# Patient Record
Sex: Female | Born: 1965
Health system: Southern US, Community
[De-identification: ages and names within clinical notes are randomized; demographics above are authoritative.]

## PROBLEM LIST (undated history)

## (undated) DIAGNOSIS — K219 Gastro-esophageal reflux disease without esophagitis: Secondary | ICD-10-CM

## (undated) DIAGNOSIS — J45909 Unspecified asthma, uncomplicated: Secondary | ICD-10-CM

## (undated) DIAGNOSIS — T7840XA Allergy, unspecified, initial encounter: Secondary | ICD-10-CM

## (undated) DIAGNOSIS — F32A Depression, unspecified: Secondary | ICD-10-CM

## (undated) DIAGNOSIS — M8580 Other specified disorders of bone density and structure, unspecified site: Secondary | ICD-10-CM

## (undated) DIAGNOSIS — F329 Major depressive disorder, single episode, unspecified: Secondary | ICD-10-CM

## (undated) DIAGNOSIS — G43909 Migraine, unspecified, not intractable, without status migrainosus: Secondary | ICD-10-CM

## (undated) DIAGNOSIS — E538 Deficiency of other specified B group vitamins: Secondary | ICD-10-CM

## (undated) DIAGNOSIS — G4733 Obstructive sleep apnea (adult) (pediatric): Secondary | ICD-10-CM

## (undated) DIAGNOSIS — Z8619 Personal history of other infectious and parasitic diseases: Secondary | ICD-10-CM

## (undated) HISTORY — DX: Migraine, unspecified, not intractable, without status migrainosus: G43.909

## (undated) HISTORY — DX: Personal history of other infectious and parasitic diseases: Z86.19

## (undated) HISTORY — DX: Obstructive sleep apnea (adult) (pediatric): G47.33

## (undated) HISTORY — PX: OTHER SURGICAL HISTORY: SHX169

## (undated) HISTORY — DX: Major depressive disorder, single episode, unspecified: F32.9

## (undated) HISTORY — DX: Unspecified asthma, uncomplicated: J45.909

## (undated) HISTORY — PX: MYRINGOTOMY: SUR874

## (undated) HISTORY — DX: Other specified disorders of bone density and structure, unspecified site: M85.80

## (undated) HISTORY — DX: Depression, unspecified: F32.A

## (undated) HISTORY — DX: Gastro-esophageal reflux disease without esophagitis: K21.9

## (undated) HISTORY — DX: Deficiency of other specified B group vitamins: E53.8

## (undated) HISTORY — DX: Allergy, unspecified, initial encounter: T78.40XA

## (undated) HISTORY — PX: INNER EAR SURGERY: SHX679

---
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
## 1999-10-19 HISTORY — PX: TUBAL LIGATION: SHX77

## 2005-10-18 HISTORY — PX: CHOLECYSTECTOMY: SHX55

## 2015-03-14 ENCOUNTER — Ambulatory Visit (INDEPENDENT_AMBULATORY_CARE_PROVIDER_SITE_OTHER): Payer: 59

## 2015-03-14 ENCOUNTER — Ambulatory Visit (INDEPENDENT_AMBULATORY_CARE_PROVIDER_SITE_OTHER): Payer: 59 | Admitting: Podiatry

## 2015-03-14 ENCOUNTER — Encounter: Payer: Self-pay | Admitting: Podiatry

## 2015-03-14 VITALS — BP 137/81 | HR 119 | Resp 12 | Ht 59.0 in | Wt 150.0 lb

## 2015-03-14 DIAGNOSIS — M79671 Pain in right foot: Secondary | ICD-10-CM

## 2015-03-14 DIAGNOSIS — M779 Enthesopathy, unspecified: Secondary | ICD-10-CM

## 2015-03-14 MED ORDER — TRIAMCINOLONE ACETONIDE 10 MG/ML IJ SUSP
10.0000 mg | Freq: Once | INTRAMUSCULAR | Status: AC
  Administered 2015-03-14: 10 mg

## 2015-03-14 NOTE — Progress Notes (Signed)
   Subjective:    Patient ID: Terri Johnson, female    DOB: 1965/12/30, 49 y.o.   MRN: [id number]  HPI    Review of Systems  All other systems reviewed and are negative.      Objective:   Physical Exam        Assessment & Plan:

## 2015-03-15 NOTE — Progress Notes (Signed)
Subjective:     Patient ID: Terri Johnson, female   DOB: 15-Jan-1966, 49 y.o.   MRN: [id number]  HPI patient presents stating she twisted her ankle around 2 months ago and felt like something popped but she's not sure. It does not feel unstable but she has pain within the ankle and also has chronic pain in both her feet from standing on cement floors at work   Review of Systems  All other systems reviewed and are negative.      Objective:   Physical Exam  Constitutional: She is oriented to person, place, and time.  Cardiovascular: Intact distal pulses.   Musculoskeletal: Normal range of motion.  Neurological: She is oriented to person, place, and time.  Skin: Skin is warm.  Nursing note and vitals reviewed.  neurovascular status intact muscle strength adequate with range of motion subtalar midtarsal joint within normal limits. Patient is noted to have quite a bit of pain in the sinus tarsi and distal to this point right with inflammation noted but no ligamentous laxity was noted and no pain around the fibula or the peroneal tendon group. She is noted to have good digital perfusion and is well oriented 3     Assessment:     Possible sprain with sinus tarsitis right and chronic low-grade foot pain secondary to high arch foot which may be creating pressure on the heel and forefoot    Plan:     H&P and conditions reviewed with patient. Also reviewed x-rays and today I did a careful sinus tarsi injection and took it more lateral in its nature. I applied fascial brace to give her some stability and discussed long-term orthotics to control the plantar pain. Reappoint in 2 weeks or reevaluate earlier if needed

## 2015-03-21 ENCOUNTER — Telehealth: Payer: Self-pay | Admitting: *Deleted

## 2015-03-21 ENCOUNTER — Ambulatory Visit: Payer: Self-pay | Admitting: Podiatry

## 2015-03-21 NOTE — Telephone Encounter (Signed)
Unable to reach patient at time of Pre-Visit Call.  Left message for patient to return call when available.    

## 2015-03-24 ENCOUNTER — Encounter: Payer: Self-pay | Admitting: Family

## 2015-03-24 ENCOUNTER — Ambulatory Visit (INDEPENDENT_AMBULATORY_CARE_PROVIDER_SITE_OTHER): Payer: 59 | Admitting: Family

## 2015-03-24 VITALS — BP 139/80 | HR 115 | Temp 98.0°F | Resp 18 | Ht 60.0 in | Wt 149.8 lb

## 2015-03-24 DIAGNOSIS — F329 Major depressive disorder, single episode, unspecified: Secondary | ICD-10-CM | POA: Insufficient documentation

## 2015-03-24 DIAGNOSIS — H547 Unspecified visual loss: Secondary | ICD-10-CM

## 2015-03-24 DIAGNOSIS — J45909 Unspecified asthma, uncomplicated: Secondary | ICD-10-CM | POA: Insufficient documentation

## 2015-03-24 DIAGNOSIS — J4520 Mild intermittent asthma, uncomplicated: Secondary | ICD-10-CM | POA: Diagnosis not present

## 2015-03-24 DIAGNOSIS — R51 Headache: Secondary | ICD-10-CM

## 2015-03-24 DIAGNOSIS — R519 Headache, unspecified: Secondary | ICD-10-CM | POA: Insufficient documentation

## 2015-03-24 DIAGNOSIS — K219 Gastro-esophageal reflux disease without esophagitis: Secondary | ICD-10-CM | POA: Diagnosis not present

## 2015-03-24 DIAGNOSIS — F418 Other specified anxiety disorders: Secondary | ICD-10-CM

## 2015-03-24 DIAGNOSIS — F32A Depression, unspecified: Secondary | ICD-10-CM | POA: Insufficient documentation

## 2015-03-24 DIAGNOSIS — F419 Anxiety disorder, unspecified: Secondary | ICD-10-CM

## 2015-03-24 MED ORDER — ESOMEPRAZOLE MAGNESIUM 40 MG PO CPDR: 40.0000 mg | DELAYED_RELEASE_CAPSULE | Freq: Every day | ORAL | Status: DC

## 2015-03-24 MED ORDER — SUMATRIPTAN SUCCINATE 50 MG PO TABS: 50.0000 mg | ORAL_TABLET | ORAL | Status: DC | PRN

## 2015-03-24 MED ORDER — MONTELUKAST SODIUM 10 MG PO TABS: 10.0000 mg | ORAL_TABLET | Freq: Every day | ORAL | Status: DC

## 2015-03-24 MED ORDER — BUPROPION HCL ER (XL) 300 MG PO TB24: 300.0000 mg | ORAL_TABLET | Freq: Every day | ORAL | Status: DC

## 2015-03-24 NOTE — Assessment & Plan Note (Signed)
Stable on nexium continue same.

## 2015-03-24 NOTE — Progress Notes (Signed)
Pre visit review using our clinic review tool, if applicable. No additional management support is needed unless otherwise documented below in the visit note. 

## 2015-03-24 NOTE — Progress Notes (Signed)
Subjective:    Patient ID: Terri Johnson, female    DOB: 1966-07-26, 49 y.o.   MRN: [id number]  HPI  Terri Johnson is a 49 yr old female who presents today to establish care.  Her chief complaint today is "sharp pains on the left side of her head which last 5-10 minutes and are associated with with visual changes. Reports that the vision in the left eye became "black." Vision loss was 30 minutes. This occurred while she was at work 2 months ago. Most recent HA was last Sunday.   Pain comes on suddenly and is "like a knife."  Reports pain is so bad that she has to sit down.  She reports that she has had 4-5 episodes in the last 2-3 months. Denies associated nausea/photophobia/phonophobia.  Reports that these episodes are different from her previous migraines.  She used to see Dr[facility]unisi at [facility].   Asthma- worse in the winter.  Recently only using albuterol once a week.    GERD- stable on nexium 40mg . Has not tolerated dropping to 20mg .   Depression/anxiety- reports hx of anxiety/ocd symptoms. Reports that she has excessive worry,lets things bother her too much.  Gets overwhelmed.  Has been on Wellbutrin since 2004.  Overall reports depression/anxiety are well controlled.  Reports she has tried lexapro in the past which did not help at all.   Reports that she has not had a period in 1 year.  Reports stress incontinence.    Review of Systems    see HPI  Past Medical History  Diagnosis Date  . Asthma   . History of chicken pox   . Depression   . Allergy   . GERD (gastroesophageal reflux disease)   . Migraines     History   Social History  . Marital Status: Married    Spouse Name: N/A  . Number of Children: N/A  . Years of Education: N/A   Occupational History  . Not on file.   Social History Main Topics  . Smoking status: Never Smoker   . Smokeless tobacco: Not on file  . Alcohol Use: Not on file  . Drug Use: Not on file  . Sexual Activity: Not on file   Other Topics  Concern  . Not on file   Social History Narrative    Past Surgical History  Procedure Laterality Date  . Myringotomy Bilateral     twice each.  Eual Fines repair - left Left   . Cholecystectomy  2007  . Tonsillectomy and adenoidectomy  1976  . Inner ear surgery      Pt states she had her "eardrum rebuilt". Has had 8 sets of tubes in ears through adolecense  . Tubal ligation  2001  . Cesarean section  1991    Family History  Problem Relation Age of Onset  . Hyperlipidemia Mother   . Heart disease Mother   . Hypertension Mother   . Depression Mother   . Parkinson's disease Father   . Depression Maternal Aunt   . Alcohol abuse Maternal Uncle   . Depression Maternal Grandmother   . Heart disease Maternal Grandfather   . Hypertension Maternal Grandfather   . Alcohol abuse Maternal Grandfather   . AVM Maternal Aunt     congenital  . Aneurysm Maternal Aunt     No Known Allergies  Current Outpatient Prescriptions on File Prior to Visit  Medication Sig Dispense Refill  . albuterol (PROVENTIL HFA;VENTOLIN HFA) 108 (90 BASE) MCG/ACT inhaler Inhale  into the lungs every 6 (six) hours as needed for wheezing or shortness of breath.    Marland Kitchen buPROPion (WELLBUTRIN XL) 300 MG 24 hr tablet Take 300 mg by mouth daily.    . cetirizine (ZYRTEC) 10 MG tablet Take 10 mg by mouth daily.    . montelukast (SINGULAIR) 10 MG tablet Take 10 mg by mouth at bedtime.    . pseudoephedrine (SUDAFED) 120 MG 12 hr tablet Take 120 mg by mouth daily.    Marland Kitchen topiramate (TOPAMAX) 50 MG tablet Take 50 mg by mouth 2 (two) times daily.     No current facility-administered medications on file prior to visit.    BP 139/80 mmHg  Pulse 115  Temp(Src) 98 F (36.7 C) (Oral)  Resp 18  Ht 5' (1.524 m)  Wt 149 lb 12.8 oz (67.949 kg)  BMI 29.26 kg/m2  SpO2 99%  LMP 03/23/2014    Objective:   Physical Exam  Constitutional: She is oriented to person, place, and time. She appears well-developed and well-nourished.   HENT:  Head: Normocephalic and atraumatic.  Right Ear: Tympanic membrane and ear canal normal.  Left Ear: Tympanic membrane and ear canal normal.  Eyes: No scleral icterus.  Cardiovascular: Normal rate, regular rhythm and normal heart sounds.   No murmur heard. Pulmonary/Chest: Effort normal and breath sounds normal. No respiratory distress. She has no wheezes.  Lymphadenopathy:    She has no cervical adenopathy.  Neurological: She is alert and oriented to person, place, and time.  Skin: Skin is warm and dry.  Psychiatric: Her behavior is normal. Judgment and thought content normal.  Quiet, flat affect.  Briefly tearful          Assessment & Plan:

## 2015-03-24 NOTE — Assessment & Plan Note (Signed)
Pt reports feeling satisfied with wellbutrin.  Continue same, monitor.

## 2015-03-24 NOTE — Assessment & Plan Note (Signed)
Symptoms could be due to migraine.  Discussed case with Dr. Charlett Blake. Will obtain ESR and CT head. Add imitrex prn and refer to neurology for ongoing management.

## 2015-03-24 NOTE — Assessment & Plan Note (Signed)
Stable on prn albuterol, continue same.

## 2015-03-24 NOTE — Patient Instructions (Signed)
Please complete lab work prior to leaving. You will be contacted about your CT head and your referral to GI. Please let me know if you have not heard back about these in 1 month.  Schedule a complete physical at the front desk. Welcome to [facility]!

## 2015-03-25 ENCOUNTER — Ambulatory Visit (HOSPITAL_BASED_OUTPATIENT_CLINIC_OR_DEPARTMENT_OTHER)
Admission: RE | Admit: 2015-03-25 | Discharge: 2015-03-25 | Disposition: A | Discharge status: Home / Self Care | Payer: 59 | Source: Ambulatory Visit | Attending: Family | Admitting: Family

## 2015-03-25 DIAGNOSIS — R519 Headache, unspecified: Secondary | ICD-10-CM

## 2015-03-25 DIAGNOSIS — H5442 Blindness, left eye, normal vision right eye: Secondary | ICD-10-CM | POA: Insufficient documentation

## 2015-03-25 DIAGNOSIS — R51 Headache: Secondary | ICD-10-CM | POA: Diagnosis present

## 2015-03-25 LAB — SEDIMENTATION RATE: Sed Rate: 34 mm/hr — ABNORMAL HIGH (ref 0–22)

## 2015-03-27 ENCOUNTER — Telehealth: Payer: Self-pay | Admitting: Family

## 2015-03-27 NOTE — Telephone Encounter (Addendum)
Opened in error

## 2015-03-28 ENCOUNTER — Telehealth: Payer: Self-pay | Admitting: Family

## 2015-03-28 ENCOUNTER — Ambulatory Visit (INDEPENDENT_AMBULATORY_CARE_PROVIDER_SITE_OTHER): Payer: 59 | Admitting: Podiatry

## 2015-03-28 DIAGNOSIS — M79671 Pain in right foot: Secondary | ICD-10-CM

## 2015-03-28 DIAGNOSIS — M779 Enthesopathy, unspecified: Secondary | ICD-10-CM

## 2015-03-28 NOTE — Telephone Encounter (Signed)
Notified pt of below results. She reports that headache comes and goes. Hasn't had another "really, really bad one" since she was last seen. Pt already has f/u with PCP on 05/05/15 and will repeat sed rate at that visit.

## 2015-03-28 NOTE — Telephone Encounter (Addendum)
Please contact pt to see how she is feeling re: headache. Sed rate is mildly elevated. This is a non-specific finding.  Recommend aspirin 81mg once daily. Recommend repeat ESR in 3-4 weeks. Call us if worsening HA.  CT head negative.  

## 2015-03-31 NOTE — Progress Notes (Signed)
Subjective:     Patient ID: Terri Johnson, female   DOB: 07-29-1966, 49 y.o.   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 Reappoint when these types of orthotics are returned

## 2015-04-07 ENCOUNTER — Telehealth: Payer: Self-pay | Admitting: Family

## 2015-04-07 NOTE — Telephone Encounter (Signed)
-----  Message from Mosie Lukes, MD sent at 04/02/2015 10:50 PM EDT ----- Sed rate only mildly hi. Would treat with steroids if pain persists but unlikely to benefit from biopsy unless symptoms worsen ----- Message -----    From: Debbrah Alar, NP    Sent: 03/27/2015  10:35 AM      To: Mosie Lukes, MD  Hi,  This is the patient we discussed re: headache.  CT head negative. ESR elevated. Would you pursue temp artery biopsy in this patient or empiric rx with prednisone? If so, do you refer to gen surgery for this?  Thanks!  Mleissa

## 2015-04-16 ENCOUNTER — Telehealth: Payer: Self-pay | Admitting: Family

## 2015-04-16 NOTE — Telephone Encounter (Signed)
pre visit letter mailed 04/14/15

## 2015-04-18 ENCOUNTER — Ambulatory Visit: Payer: 59 | Admitting: Podiatry

## 2015-04-18 DIAGNOSIS — M779 Enthesopathy, unspecified: Secondary | ICD-10-CM

## 2015-04-18 NOTE — Patient Instructions (Signed)

## 2015-04-18 NOTE — Progress Notes (Signed)
pT IS HERE TO [place]

## 2015-05-05 ENCOUNTER — Encounter: Payer: Self-pay | Admitting: Family

## 2015-05-05 ENCOUNTER — Ambulatory Visit (INDEPENDENT_AMBULATORY_CARE_PROVIDER_SITE_OTHER): Payer: 59 | Admitting: Family

## 2015-05-05 ENCOUNTER — Ambulatory Visit: Payer: 59 | Admitting: Neurology

## 2015-05-05 VITALS — BP 120/84 | HR 100 | Temp 97.8°F | Resp 18 | Ht 60.0 in | Wt 152.6 lb

## 2015-05-05 DIAGNOSIS — E559 Vitamin D deficiency, unspecified: Secondary | ICD-10-CM | POA: Diagnosis not present

## 2015-05-05 DIAGNOSIS — N951 Menopausal and female climacteric states: Secondary | ICD-10-CM | POA: Diagnosis not present

## 2015-05-05 DIAGNOSIS — Z0000 Encounter for general adult medical examination without abnormal findings: Secondary | ICD-10-CM

## 2015-05-05 DIAGNOSIS — Z780 Asymptomatic menopausal state: Secondary | ICD-10-CM | POA: Diagnosis not present

## 2015-05-05 DIAGNOSIS — R232 Flushing: Secondary | ICD-10-CM

## 2015-05-05 NOTE — Patient Instructions (Addendum)
Please complete lab work prior to leaving.  You will be contacted about your referral to GYN. Follow up in 6 months.

## 2015-05-05 NOTE — Progress Notes (Signed)
Pre visit review using our clinic review tool, if applicable. No additional management support is needed unless otherwise documented below in the visit note. 

## 2015-05-05 NOTE — Progress Notes (Signed)
Subjective:    Patient ID: Terri Johnson, female    DOB: [birth year], 49 y.o.   MRN: [id number]  HPI  Terri Johnson is a 49 yr old female who presents today for cpx.  Immunizations:last tetanus 2/16 when she had head laceration at [facility].  Diet: not eating healthy.  Reports + GERD symptoms.  (reports that she had endo 3 yrs ago- reports normal. On nexium).  Never wanted to proceed with the gastroparesis study.   Breakfast- granola bar Lunch- granola bar Dinner- chicken, salad, veggie avoids red meat.   Exercise: reports that she walks 3 x a week x 45 minutes. Colonoscopy: to begin at 50 Pap Smear: 2 years ago. Mammogram: due Dental: up to date Vision: up to date  LMP was 1.5 yrs ago.  + hot flashes, not severe.  Reports low sex drive.  Sleeps with the fan on her.    Review of Systems  Constitutional: Negative for unexpected weight change.  HENT: Negative for hearing loss and rhinorrhea.   Eyes: Negative for visual disturbance.  Respiratory: Negative for cough.   Cardiovascular: Negative for leg swelling.  Gastrointestinal: Negative for nausea, diarrhea and constipation.  Genitourinary: Negative for dysuria and frequency.  Musculoskeletal: Negative for myalgias.       Occasional hip pain  Skin: Negative for rash.  Neurological:       Seeing neuro for headaches  Hematological: Negative for adenopathy.  Psychiatric/Behavioral: Negative for dysphoric mood and agitation.       Past Medical History  Diagnosis Date  . Asthma   . History of chicken pox   . Depression   . Allergy   . GERD (gastroesophageal reflux disease)   . Migraines     History   Social History  . Marital Status: Married    Spouse Name: N/A  . Number of Children: N/A  . Years of Education: N/A   Occupational History  . Not on file.   Social History Main Topics  . Smoking status: Never Smoker   . Smokeless tobacco: Not on file  . Alcohol Use: Not on file  . Drug Use: Not on file  .  Sexual Activity: Not on file   Other Topics Concern  . 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    Has associates degree   Enjoys painting, animals, antiquing, shopping, reading, movies       Past Surgical History  Procedure Laterality Date  . Myringotomy Bilateral     twice each.  Eual Fines repair - left Left   . Cholecystectomy  2007  . Tonsillectomy and adenoidectomy  1976  . Inner ear surgery      Pt states she had her "eardrum rebuilt". Has had 8 sets of tubes in ears through adolecense  . Tubal ligation  2001  . Cesarean section  1991    Family History  Problem Relation Age of Onset  . Hyperlipidemia Mother   . Heart disease Mother   . Hypertension Mother   . Depression Mother   . Parkinson's disease Father   . Depression Maternal Aunt   . Alcohol abuse Maternal Uncle   . Depression Maternal Grandmother   . Heart disease Maternal Grandfather   . Hypertension Maternal Grandfather   . Alcohol abuse Maternal Grandfather   . AVM Maternal Aunt     congenital  . Aneurysm Maternal Aunt  No Known Allergies  Current Outpatient Prescriptions on File Prior to Visit  Medication Sig Dispense Refill  . albuterol (PROVENTIL HFA;VENTOLIN HFA) 108 (90 BASE) MCG/ACT inhaler Inhale into the lungs every 6 (six) hours as needed for wheezing or shortness of breath.    Marland Kitchen aspirin 81 MG tablet Take 81 mg by mouth daily.    . betamethasone valerate ointment (VALISONE) 0.1 % Apply small amount to ear as needed for itching/scaling.    Marland Kitchen buPROPion (WELLBUTRIN XL) 300 MG 24 hr tablet Take 1 tablet (300 mg total) by mouth daily. 90 tablet 1  . cetirizine (ZYRTEC) 10 MG tablet Take 10 mg by mouth daily.    . ergocalciferol (VITAMIN D2) 50000 UNITS capsule Take 50,000 Units by mouth once a week.    . esomeprazole (NEXIUM) 40 MG capsule Take 1 capsule (40 mg total) by mouth daily at 12 noon. 90 capsule 1  .  montelukast (SINGULAIR) 10 MG tablet Take 1 tablet (10 mg total) by mouth at bedtime. 90 tablet 1  . pseudoephedrine (SUDAFED) 120 MG 12 hr tablet Take 120 mg by mouth daily.    . SUMAtriptan (IMITREX) 50 MG tablet Take 1 tablet (50 mg total) by mouth every 2 (two) hours as needed for migraine. May repeat in 2 hours if headache persists or recurs. 10 tablet 0  . topiramate (TOPAMAX) 50 MG tablet Take 50 mg by mouth 2 (two) times daily.     No current facility-administered medications on file prior to visit.    BP 120/84 mmHg  Pulse 100  Temp(Src) 97.8 F (36.6 C) (Oral)  Resp 18  Ht 5' (1.524 m)  Wt 152 lb 9.6 oz (69.219 kg)  BMI 29.80 kg/m2  SpO2 100%  LMP 03/23/2014    Objective:   Physical Exam   Physical Exam  Constitutional: She is oriented to person, place, and time. She appears well-developed and well-nourished. No distress.  HENT:  Head: Normocephalic and atraumatic.  Right Ear: Tympanic membrane and ear canal normal.  Left Ear: Tympanic membrane and ear canal normal.  Mouth/Throat: Oropharynx is clear and moist.  Eyes: Pupils are equal, round, and reactive to light. No scleral icterus.  Neck: Normal range of motion. No thyromegaly present.  Cardiovascular: Normal rate and regular rhythm.   No murmur heard. Pulmonary/Chest: Effort normal and breath sounds normal. No respiratory distress. He has no wheezes. She has no rales. She exhibits no tenderness.  Abdominal: Soft. Bowel sounds are normal. He exhibits no distension and no mass. There is no tenderness. There is no rebound and no guarding.  Musculoskeletal: She exhibits no edema.  Lymphadenopathy:    She has no cervical adenopathy.  Neurological: She is alert and oriented to person, place, and time. She has normal patellar reflexes. She exhibits normal muscle tone. Coordination normal.  Skin: Skin is warm and dry.  Psychiatric: She has a normal mood and affect. Her behavior is normal. Judgment and thought content  normal.  Breasts: Examined lying Right: Without masses, retractions, discharge or axillary adenopathy.  Left: Without masses, retractions, discharge or axillary adenopathy.  Pelvis: deferred    Assessment & Plan:           Assessment & Plan:  EKG tracing is personally reviewed.  EKG notes NSR.  No acute changes.

## 2015-05-06 LAB — LIPID PANEL
Cholesterol: 199 mg/dL (ref 0–200)
HDL: 53.1 mg/dL (ref >39.00)
LDL Cholesterol: 132 mg/dL — ABNORMAL HIGH (ref 0–99)
NonHDL: 145.9
Total CHOL/HDL Ratio: 4
Triglycerides: 71 mg/dL (ref 0.0–149.0)
VLDL: 14.2 mg/dL (ref 0.0–40.0)

## 2015-05-06 LAB — CBC WITH DIFFERENTIAL/PLATELET
BASOS ABS: 0.2 10*3/uL — AB (ref 0.0–0.1)
Basophils Relative: 2.4 % (ref 0.0–3.0)
EOS ABS: 0.1 10*3/uL (ref 0.0–0.7)
EOS PCT: 2.2 % (ref 0.0–5.0)
HCT: 31.9 % — ABNORMAL LOW (ref 36.0–46.0)
Hemoglobin: 10.7 g/dL — ABNORMAL LOW (ref 12.0–15.0)
Lymphocytes Relative: 41.2 % (ref 12.0–46.0)
Lymphs Abs: 2.8 10*3/uL (ref 0.7–4.0)
MCHC: 33.6 g/dL (ref 30.0–36.0)
MCV: 88 fl (ref 78.0–100.0)
MONO ABS: 0.4 10*3/uL (ref 0.1–1.0)
MONOS PCT: 5.5 % (ref 3.0–12.0)
Neutro Abs: 3.4 10*3/uL (ref 1.4–7.7)
Neutrophils Relative %: 48.7 % (ref 43.0–77.0)
PLATELETS: 291 10*3/uL (ref 150.0–400.0)
RBC: 3.63 Mil/uL — ABNORMAL LOW (ref 3.87–5.11)
RDW: 15.5 % (ref 11.5–15.5)
WBC: 6.9 10*3/uL (ref 4.0–10.5)

## 2015-05-06 LAB — URINALYSIS, ROUTINE W REFLEX MICROSCOPIC
BILIRUBIN URINE: NEGATIVE
Hgb urine dipstick: NEGATIVE
Ketones, ur: NEGATIVE
Leukocytes, UA: NEGATIVE
Nitrite: NEGATIVE
RBC / HPF: NONE SEEN (ref 0–?)
SPECIFIC GRAVITY, URINE: 1.02 (ref 1.000–1.030)
Total Protein, Urine: NEGATIVE
URINE GLUCOSE: NEGATIVE
UROBILINOGEN UA: 0.2 (ref 0.0–1.0)
pH: 6 (ref 5.0–8.0)

## 2015-05-06 LAB — BASIC METABOLIC PANEL
BUN: 11 mg/dL (ref 6–23)
CO2: 22 meq/L (ref 19–32)
Calcium: 9.1 mg/dL (ref 8.4–10.5)
Chloride: 108 mEq/L (ref 96–112)
Creatinine, Ser: 0.93 mg/dL (ref 0.40–1.20)
GFR: 68.16 mL/min (ref >60.00)
Glucose, Bld: 72 mg/dL (ref 70–99)
POTASSIUM: 3.7 meq/L (ref 3.5–5.1)
Sodium: 138 mEq/L (ref 135–145)

## 2015-05-06 LAB — HEPATIC FUNCTION PANEL
ALK PHOS: 94 U/L (ref 39–117)
ALT: 17 U/L (ref 0–35)
AST: 19 U/L (ref 0–37)
Albumin: 4.1 g/dL (ref 3.5–5.2)
BILIRUBIN DIRECT: 0.1 mg/dL (ref 0.0–0.3)
Total Bilirubin: 0.3 mg/dL (ref 0.2–1.2)
Total Protein: 6.9 g/dL (ref 6.0–8.3)

## 2015-05-06 LAB — VITAMIN D 25 HYDROXY (VIT D DEFICIENCY, FRACTURES): VITD: 13.99 ng/mL — ABNORMAL LOW (ref 30.00–100.00)

## 2015-05-06 LAB — TSH: TSH: 1.93 u[IU]/mL (ref 0.35–4.50)

## 2015-05-11 DIAGNOSIS — Z0000 Encounter for general adult medical examination without abnormal findings: Secondary | ICD-10-CM | POA: Insufficient documentation

## 2015-05-11 DIAGNOSIS — R232 Flushing: Secondary | ICD-10-CM | POA: Insufficient documentation

## 2015-05-11 NOTE — Assessment & Plan Note (Signed)
Immunizations reviewed and up to date.  Discussed healthy diet, continue regular exercise.  Obtain routine labs.

## 2015-05-11 NOTE — Addendum Note (Signed)
Addended by: Debbrah Alar on: 05/11/2015 10:51 AM   Modules accepted: Miquel Dunn

## 2015-05-11 NOTE — Assessment & Plan Note (Signed)
Refer to gyn for possible HRT due.

## 2015-05-12 ENCOUNTER — Telehealth: Payer: Self-pay | Admitting: *Deleted

## 2015-05-12 DIAGNOSIS — E559 Vitamin D deficiency, unspecified: Secondary | ICD-10-CM

## 2015-05-12 DIAGNOSIS — D649 Anemia, unspecified: Secondary | ICD-10-CM

## 2015-05-12 MED ORDER — ERGOCALCIFEROL 1.25 MG (50000 UT) PO CAPS: 50000.0000 [IU] | ORAL_CAPSULE | ORAL | Status: DC

## 2015-05-12 NOTE — Telephone Encounter (Signed)
Notified pt and she voices understanding.  States she was on the weekly dose but had been out.  Rx sent, future lab order entered.

## 2015-05-12 NOTE — Telephone Encounter (Signed)
-----   Message from Debbrah Alar, NP sent at 05/11/2015  8:28 AM EDT ----- Could you please ask lab to to add on serum iron, b12, folate dx anemia? I would like pt to complete ifob as well due to anemia. Kidney function, liver function, choleserol all ok.  Vit D is low.  Is she taking the weekly supplement?  Needs to take weekly x 12 weeks then repeat vit d level please- dx vit d deficiency.

## 2015-05-13 ENCOUNTER — Other Ambulatory Visit (INDEPENDENT_AMBULATORY_CARE_PROVIDER_SITE_OTHER): Payer: 59

## 2015-05-13 ENCOUNTER — Ambulatory Visit (HOSPITAL_BASED_OUTPATIENT_CLINIC_OR_DEPARTMENT_OTHER)
Admission: RE | Admit: 2015-05-13 | Discharge: 2015-05-13 | Disposition: A | Discharge status: Home / Self Care | Payer: 59 | Source: Ambulatory Visit | Attending: Family | Admitting: Family

## 2015-05-13 DIAGNOSIS — E559 Vitamin D deficiency, unspecified: Secondary | ICD-10-CM

## 2015-05-13 DIAGNOSIS — Z1231 Encounter for screening mammogram for malignant neoplasm of breast: Secondary | ICD-10-CM | POA: Diagnosis present

## 2015-05-13 DIAGNOSIS — D649 Anemia, unspecified: Secondary | ICD-10-CM | POA: Diagnosis not present

## 2015-05-13 DIAGNOSIS — Z0000 Encounter for general adult medical examination without abnormal findings: Secondary | ICD-10-CM

## 2015-05-14 ENCOUNTER — Telehealth: Payer: Self-pay | Admitting: Family

## 2015-05-14 ENCOUNTER — Encounter: Payer: Self-pay | Admitting: Family

## 2015-05-14 DIAGNOSIS — E538 Deficiency of other specified B group vitamins: Secondary | ICD-10-CM

## 2015-05-14 HISTORY — DX: Deficiency of other specified B group vitamins: E53.8

## 2015-05-14 LAB — IRON: Iron: 49 ug/dL (ref 42–145)

## 2015-05-14 LAB — VITAMIN B12: Vitamin B-12: 115 pg/mL — ABNORMAL LOW (ref 211–911)

## 2015-05-14 LAB — FOLATE: Folate: 9.8 ng/mL (ref >5.9)

## 2015-05-14 LAB — VITAMIN D 25 HYDROXY (VIT D DEFICIENCY, FRACTURES): VITD: 12.22 ng/mL — ABNORMAL LOW (ref 30.00–100.00)

## 2015-05-14 NOTE — Telephone Encounter (Signed)
b12 is low.  Advise pt to start b12 injections 1028mcg once weekly x 4 weeks, then once monthly, repeat b12 in 3 months. Dx

## 2015-05-14 NOTE — Telephone Encounter (Addendum)
b12 low. Advise start b12 1098mcg IM once weekly x 4 weeks, then once monthly. Does she drink alcohol?  If so she should discontinue use.

## 2015-05-22 ENCOUNTER — Other Ambulatory Visit (INDEPENDENT_AMBULATORY_CARE_PROVIDER_SITE_OTHER): Payer: 59

## 2015-05-22 DIAGNOSIS — D649 Anemia, unspecified: Secondary | ICD-10-CM

## 2015-05-22 LAB — FECAL OCCULT BLOOD, IMMUNOCHEMICAL: Fecal Occult Bld: NEGATIVE

## 2015-05-23 ENCOUNTER — Encounter: Payer: Self-pay | Admitting: Family

## 2015-05-29 NOTE — Telephone Encounter (Signed)
Called left message to call back 

## 2015-05-29 NOTE — Telephone Encounter (Signed)
Called the patient informed of results and PCP instructions.  The patient did agree to start B12 shots.  Transferred to a scheduler Pleas Koch) to schedule first B12 injection (nurse Visit).  Also patient had still not gotten an appointment for an OBGYN (referred on 05/05/15 by Debbrah Alar and notes were then sent to [facility]), Marj stated she would take care of for this patient.

## 2015-05-29 NOTE — Telephone Encounter (Signed)
Caller name: Kaelah Hayashi  Relationship to patient: Self  Can be reached: [phone] Pharmacy:  Reason for call: pt is returning your call.

## 2015-05-30 ENCOUNTER — Ambulatory Visit (INDEPENDENT_AMBULATORY_CARE_PROVIDER_SITE_OTHER): Payer: 59

## 2015-05-30 DIAGNOSIS — E538 Deficiency of other specified B group vitamins: Secondary | ICD-10-CM | POA: Diagnosis not present

## 2015-05-30 MED ORDER — CYANOCOBALAMIN 1000 MCG/ML IJ SOLN
1000.0000 ug | Freq: Once | INTRAMUSCULAR | Status: AC
  Administered 2015-05-30: 1000 ug via INTRAMUSCULAR

## 2015-05-30 NOTE — Progress Notes (Signed)
Pre visit review using our clinic review tool, if applicable. No additional management support is needed unless otherwise documented below in the visit note. 

## 2015-06-06 ENCOUNTER — Ambulatory Visit (INDEPENDENT_AMBULATORY_CARE_PROVIDER_SITE_OTHER): Payer: 59 | Admitting: *Deleted

## 2015-06-06 DIAGNOSIS — E538 Deficiency of other specified B group vitamins: Secondary | ICD-10-CM | POA: Diagnosis not present

## 2015-06-06 MED ORDER — CYANOCOBALAMIN 1000 MCG/ML IJ SOLN
1000.0000 ug | Freq: Once | INTRAMUSCULAR | Status: AC
  Administered 2015-06-06: 1000 ug via INTRAMUSCULAR

## 2015-06-06 NOTE — Progress Notes (Signed)
Pre visit review using our clinic review tool, if applicable. No additional management support is needed unless otherwise documented below in the visit note.  Patient tolerated injection well.  Next injection previously scheduled.

## 2015-06-13 ENCOUNTER — Ambulatory Visit (INDEPENDENT_AMBULATORY_CARE_PROVIDER_SITE_OTHER): Payer: 59 | Admitting: *Deleted

## 2015-06-13 DIAGNOSIS — E538 Deficiency of other specified B group vitamins: Secondary | ICD-10-CM | POA: Diagnosis not present

## 2015-06-13 MED ORDER — CYANOCOBALAMIN 1000 MCG/ML IJ SOLN
1000.0000 ug | Freq: Once | INTRAMUSCULAR | Status: AC
  Administered 2015-06-13: 1000 ug via INTRAMUSCULAR

## 2015-06-13 NOTE — Progress Notes (Signed)
Pre visit review using our clinic review tool, if applicable. No additional management support is needed unless otherwise documented below in the visit note.  Patient tolerated injection well.  Next injection previously scheduled.

## 2015-06-20 ENCOUNTER — Ambulatory Visit (INDEPENDENT_AMBULATORY_CARE_PROVIDER_SITE_OTHER): Payer: 59 | Admitting: *Deleted

## 2015-06-20 DIAGNOSIS — E538 Deficiency of other specified B group vitamins: Secondary | ICD-10-CM

## 2015-06-20 MED ORDER — CYANOCOBALAMIN 1000 MCG/ML IJ SOLN
1000.0000 ug | Freq: Once | INTRAMUSCULAR | Status: AC
  Administered 2015-06-20: 1000 ug via INTRAMUSCULAR

## 2015-06-20 NOTE — Progress Notes (Signed)
Pre visit review using our clinic review tool, if applicable. No additional management support is needed unless otherwise documented below in the visit note.  Patient tolerated injection well.   Next injection scheduled 07/18/15.  

## 2015-06-24 ENCOUNTER — Encounter: Payer: Self-pay | Admitting: Internal Medicine

## 2015-06-24 LAB — HM PAP SMEAR: HM PAP: NEGATIVE

## 2015-06-26 ENCOUNTER — Ambulatory Visit: Payer: 59 | Admitting: Neurology

## 2015-07-02 ENCOUNTER — Telehealth: Payer: Self-pay | Admitting: Family

## 2015-07-02 NOTE — Telephone Encounter (Signed)
Spoke with pt. States she was feeling great once she started B12 injections. Last injection 06/20/15 and will now be getting injection monthly. Pt states she has been very fatigued since Sunday and has been crying for the last 2 days. Denies depression or suicidal thoughts. Denies recent traumatic events and states she "has no reason to cry".  Spoke with PCP and advised pt that she doesn't feel crying is from the B12 injections and she would like to evaluate pt in the office. Pt scheduled appt for Friday at [facility] pt if she feels her symptoms are worsening to call the office for earlier appointment and she voices understanding.

## 2015-07-02 NOTE — Telephone Encounter (Signed)
Relation to IO:MBTD Call back number:[phone]   Reason for call:  Patient would like to discuss side effects of B12, patient started crying and would like to speak with Gilmore Laroche directly.

## 2015-07-04 ENCOUNTER — Encounter: Payer: Self-pay | Admitting: Family

## 2015-07-04 ENCOUNTER — Ambulatory Visit (INDEPENDENT_AMBULATORY_CARE_PROVIDER_SITE_OTHER): Payer: 59 | Admitting: Family

## 2015-07-04 VITALS — BP 120/72 | HR 106 | Temp 98.1°F | Resp 16 | Ht 60.0 in | Wt 156.4 lb

## 2015-07-04 DIAGNOSIS — F418 Other specified anxiety disorders: Secondary | ICD-10-CM | POA: Diagnosis not present

## 2015-07-04 DIAGNOSIS — D519 Vitamin B12 deficiency anemia, unspecified: Secondary | ICD-10-CM | POA: Insufficient documentation

## 2015-07-04 DIAGNOSIS — F419 Anxiety disorder, unspecified: Secondary | ICD-10-CM

## 2015-07-04 DIAGNOSIS — F32A Depression, unspecified: Secondary | ICD-10-CM

## 2015-07-04 DIAGNOSIS — F329 Major depressive disorder, single episode, unspecified: Secondary | ICD-10-CM

## 2015-07-04 LAB — CBC WITH DIFFERENTIAL/PLATELET
BASOS PCT: 1 % (ref 0–1)
Basophils Absolute: 0.1 10*3/uL (ref 0.0–0.1)
Eosinophils Absolute: 0.2 10*3/uL (ref 0.0–0.7)
Eosinophils Relative: 3 % (ref 0–5)
HCT: 30.4 % — ABNORMAL LOW (ref 36.0–46.0)
HEMOGLOBIN: 10.2 g/dL — AB (ref 12.0–15.0)
Lymphocytes Relative: 40 % (ref 12–46)
Lymphs Abs: 2.7 10*3/uL (ref 0.7–4.0)
MCH: 29.2 pg (ref 26.0–34.0)
MCHC: 33.6 g/dL (ref 30.0–36.0)
MCV: 87.1 fL (ref 78.0–100.0)
MPV: 10.5 fL (ref 8.6–12.4)
Monocytes Absolute: 0.5 10*3/uL (ref 0.1–1.0)
Monocytes Relative: 8 % (ref 3–12)
NEUTROS ABS: 3.3 10*3/uL (ref 1.7–7.7)
NEUTROS PCT: 48 % (ref 43–77)
PLATELETS: 361 10*3/uL (ref 150–400)
RBC: 3.49 MIL/uL — ABNORMAL LOW (ref 3.87–5.11)
RDW: 15.1 % (ref 11.5–15.5)
WBC: 6.8 10*3/uL (ref 4.0–10.5)

## 2015-07-04 MED ORDER — VENLAFAXINE HCL ER 37.5 MG PO CP24: ORAL_CAPSULE | ORAL | Status: DC

## 2015-07-04 NOTE — Assessment & Plan Note (Signed)
Obtain follow up CBC due to complaint of fatigue.  I suspect fatigue is due to depression however.  TSH was normal last visit.

## 2015-07-04 NOTE — Assessment & Plan Note (Signed)
Deteriorated.  Add trial of effexor, continue wellbutrin. Advised pt to go to the ED if she develops thoughts of hurting herself or others.

## 2015-07-04 NOTE — Patient Instructions (Signed)
Please complete lab work prior to leaving. Start effexor xr one tab once daily, increase to 2 tabs once daily on week two. Follow up in 1 month, call sooner if problems/concerns.

## 2015-07-04 NOTE — Progress Notes (Signed)
Pre visit review using our clinic review tool, if applicable. No additional management support is needed unless otherwise documented below in the visit note. 

## 2015-07-04 NOTE — Addendum Note (Signed)
Addended by: Kelle Darting A on: 07/04/2015 02:48 PM   Modules accepted: Orders, Medications

## 2015-07-04 NOTE — Progress Notes (Signed)
Subjective:    Patient ID: Terri Johnson, female    DOB: May 04, 1966, 49 y.o.   MRN: [id number]  HPI   Terri Johnson is a 49 yr old female who presents today to discuss fatigue. Reports for the last two weeks she has found herself constantly tearful. Finds herself sitting on the couch, napping, not wanting to do anything. No new stressors. Denies SI/HI.  She reports that she could "sleep 24/7".She is on wellbutrin. Reports that she has tried prozac and lexapro in the past which "messed with my sex drive and I didn't see any difference."    Review of Systems See HPI  Past Medical History  Diagnosis Date  . Asthma   . History of chicken pox   . Depression   . Allergy   . GERD (gastroesophageal reflux disease)   . Migraines   . B12 deficiency 05/14/2015    Social History   Social History  . Marital Status: Married    Spouse Name: N/A  . Number of Children: N/A  . Years of Education: N/A   Occupational History  . Not on file.   Social History Main Topics  . Smoking status: Never Smoker   . Smokeless tobacco: Not on file  . Alcohol Use: Not on file  . Drug Use: Not on file  . Sexual Activity: Not on file   Other Topics Concern  . 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    Has associates degree   Enjoys painting, animals, antiquing, shopping, reading, movies       Past Surgical History  Procedure Laterality Date  . Myringotomy Bilateral     twice each.  Eual Fines repair - left Left   . Cholecystectomy  2007  . Tonsillectomy and adenoidectomy  1976  . Inner ear surgery      Pt states she had her "eardrum rebuilt". Has had 8 sets of tubes in ears through adolecense  . Tubal ligation  2001  . Cesarean section  1991    Family History  Problem Relation Age of Onset  . Hyperlipidemia Mother   . Heart disease Mother   . Hypertension Mother   . Depression Mother   . Parkinson's  disease Father   . Depression Maternal Aunt   . Alcohol abuse Maternal Uncle   . Depression Maternal Grandmother   . Heart disease Maternal Grandfather   . Hypertension Maternal Grandfather   . Alcohol abuse Maternal Grandfather   . AVM Maternal Aunt     congenital  . Aneurysm Maternal Aunt     No Known Allergies  Current Outpatient Prescriptions on File Prior to Visit  Medication Sig Dispense Refill  . albuterol (PROVENTIL HFA;VENTOLIN HFA) 108 (90 BASE) MCG/ACT inhaler Inhale into the lungs every 6 (six) hours as needed for wheezing or shortness of breath.    Marland Kitchen aspirin 81 MG tablet Take 81 mg by mouth daily.    . betamethasone valerate ointment (VALISONE) 0.1 % Apply small amount to ear as needed for itching/scaling.    Marland Kitchen buPROPion (WELLBUTRIN XL) 300 MG 24 hr tablet Take 1 tablet (300 mg total) by mouth daily. 90 tablet 1  . cetirizine (ZYRTEC) 10 MG tablet Take 10 mg by mouth daily.    . ergocalciferol (VITAMIN D2) 50000 UNITS capsule Take 1 capsule (50,000 Units total) by mouth once a week. 12 capsule 0  .  esomeprazole (NEXIUM) 40 MG capsule Take 1 capsule (40 mg total) by mouth daily at 12 noon. 90 capsule 1  . montelukast (SINGULAIR) 10 MG tablet Take 1 tablet (10 mg total) by mouth at bedtime. 90 tablet 1  . pseudoephedrine (SUDAFED) 120 MG 12 hr tablet Take 120 mg by mouth daily.    . SUMAtriptan (IMITREX) 50 MG tablet Take 1 tablet (50 mg total) by mouth every 2 (two) hours as needed for migraine. May repeat in 2 hours if headache persists or recurs. 10 tablet 0  . topiramate (TOPAMAX) 50 MG tablet Take 50 mg by mouth 2 (two) times daily.     No current facility-administered medications on file prior to visit.    BP 120/72 mmHg  Pulse 106  Temp(Src) 98.1 F (36.7 C) (Oral)  Resp 16  Ht 5' (1.524 m)  Wt 156 lb 6.4 oz (70.943 kg)  BMI 30.54 kg/m2  SpO2 100%  LMP 03/23/2014       Objective:   Physical Exam  Constitutional: She is oriented to person, place, and  time. She appears well-developed and well-nourished. No distress.  HENT:  Head: Normocephalic and atraumatic.  Cardiovascular: Normal rate and regular rhythm.   No murmur heard. Pulmonary/Chest: Effort normal and breath sounds normal. No respiratory distress. She has no wheezes. She has no rales. She exhibits no tenderness.  Musculoskeletal: She exhibits no edema.  Neurological: She is alert and oriented to person, place, and time.  Skin: Skin is warm and dry.  Psychiatric: Her behavior is normal. Judgment and thought content normal.  tearful          Assessment & Plan:

## 2015-07-06 ENCOUNTER — Encounter: Payer: Self-pay | Admitting: Family

## 2015-07-18 ENCOUNTER — Ambulatory Visit (INDEPENDENT_AMBULATORY_CARE_PROVIDER_SITE_OTHER): Payer: 59

## 2015-07-18 DIAGNOSIS — E538 Deficiency of other specified B group vitamins: Secondary | ICD-10-CM

## 2015-07-18 MED ORDER — CYANOCOBALAMIN 1000 MCG/ML IJ SOLN: 1000.0000 ug | Freq: Once | INTRAMUSCULAR | Status: DC

## 2015-07-18 MED ORDER — CYANOCOBALAMIN 1000 MCG/ML IJ SOLN
1000.0000 ug | Freq: Once | INTRAMUSCULAR | Status: AC
  Administered 2015-07-18: 1000 ug via INTRAMUSCULAR

## 2015-08-04 ENCOUNTER — Encounter: Payer: Self-pay | Admitting: Family

## 2015-08-04 ENCOUNTER — Ambulatory Visit (INDEPENDENT_AMBULATORY_CARE_PROVIDER_SITE_OTHER): Payer: 59 | Admitting: Family

## 2015-08-04 VITALS — BP 128/81 | HR 110 | Temp 98.1°F | Resp 18 | Ht 60.0 in | Wt 157.4 lb

## 2015-08-04 DIAGNOSIS — F419 Anxiety disorder, unspecified: Secondary | ICD-10-CM

## 2015-08-04 DIAGNOSIS — G4710 Hypersomnia, unspecified: Secondary | ICD-10-CM | POA: Diagnosis not present

## 2015-08-04 DIAGNOSIS — E559 Vitamin D deficiency, unspecified: Secondary | ICD-10-CM

## 2015-08-04 DIAGNOSIS — F329 Major depressive disorder, single episode, unspecified: Secondary | ICD-10-CM

## 2015-08-04 DIAGNOSIS — R400 Somnolence: Secondary | ICD-10-CM

## 2015-08-04 DIAGNOSIS — D518 Other vitamin B12 deficiency anemias: Secondary | ICD-10-CM | POA: Diagnosis not present

## 2015-08-04 DIAGNOSIS — E538 Deficiency of other specified B group vitamins: Secondary | ICD-10-CM

## 2015-08-04 DIAGNOSIS — D519 Vitamin B12 deficiency anemia, unspecified: Secondary | ICD-10-CM

## 2015-08-04 DIAGNOSIS — F418 Other specified anxiety disorders: Secondary | ICD-10-CM

## 2015-08-04 MED ORDER — AMOXICILLIN-POT CLAVULANATE 875-125 MG PO TABS: 1.0000 | ORAL_TABLET | Freq: Two times a day (BID) | ORAL | Status: DC

## 2015-08-04 MED ORDER — VENLAFAXINE HCL ER 37.5 MG PO CP24: ORAL_CAPSULE | ORAL | Status: DC

## 2015-08-04 MED ORDER — ALBUTEROL SULFATE HFA 108 (90 BASE) MCG/ACT IN AERS: 2.0000 | INHALATION_SPRAY | Freq: Four times a day (QID) | RESPIRATORY_TRACT | Status: DC | PRN

## 2015-08-04 NOTE — Progress Notes (Signed)
Subjective:    Patient ID: Terri Johnson, female    DOB: Jun 08, 1966, 49 y.o.   MRN: [id number]  HPI  Terri Johnson is a 49 yr old female who presents today for follow up of anxiety/depresssion. Last visit effexor was added to her regimen. She complained of fatigue. She was noted to have a microcytic anemia. (Hgb 10.2). TSH normal back in July. FOB was negative in August. b12 was low and she was placed on b12 injections.  Reports that she is less tearful, able to get out of bed, no longer sleeping all the time. Reports that the fatigue is about the same.    Vit D was also noted to be low. She is on an otc vit D supplement.   Reports + nasal congestion/pain/pressure, cough, since the end of September.    Review of Systems See HPI  Past Medical History  Diagnosis Date  . Asthma   . History of chicken pox   . Depression   . Allergy   . GERD (gastroesophageal reflux disease)   . Migraines   . B12 deficiency 05/14/2015    Social History   Social History  . Marital Status: Married    Spouse Name: N/A  . Number of Children: N/A  . Years of Education: N/A   Occupational History  . Not on file.   Social History Main Topics  . Smoking status: Never Smoker   . Smokeless tobacco: Not on file  . Alcohol Use: Not on file  . Drug Use: Not on file  . Sexual Activity: Not on file   Other Topics Concern  . 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    Has associates degree   Enjoys painting, animals, antiquing, shopping, reading, movies       Past Surgical History  Procedure Laterality Date  . Myringotomy Bilateral     twice each.  Eual Fines repair - left Left   . Cholecystectomy  2007  . Tonsillectomy and adenoidectomy  1976  . Inner ear surgery      Pt states she had her "eardrum rebuilt". Has had 8 sets of tubes in ears through adolecense  . Tubal ligation  2001  . Cesarean section  1991     Family History  Problem Relation Age of Onset  . Hyperlipidemia Mother   . Heart disease Mother   . Hypertension Mother   . Depression Mother   . Parkinson's disease Father   . Depression Maternal Aunt   . Alcohol abuse Maternal Uncle   . Depression Maternal Grandmother   . Heart disease Maternal Grandfather   . Hypertension Maternal Grandfather   . Alcohol abuse Maternal Grandfather   . AVM Maternal Aunt     congenital  . Aneurysm Maternal Aunt     No Known Allergies  Current Outpatient Prescriptions on File Prior to Visit  Medication Sig Dispense Refill  . aspirin 81 MG tablet Take 81 mg by mouth daily.    . betamethasone valerate ointment (VALISONE) 0.1 % Apply small amount to ear as needed for itching/scaling.    Marland Kitchen buPROPion (WELLBUTRIN XL) 300 MG 24 hr tablet Take 1 tablet (300 mg total) by mouth daily. 90 tablet 1  . cetirizine (ZYRTEC) 10 MG tablet Take 10 mg by mouth daily.    . CYANOCOBALAMIN IJ Inject 1,000 mcg as directed every 30 (thirty) days.    Marland Kitchen  esomeprazole (NEXIUM) 40 MG capsule Take 1 capsule (40 mg total) by mouth daily at 12 noon. 90 capsule 1  . montelukast (SINGULAIR) 10 MG tablet Take 1 tablet (10 mg total) by mouth at bedtime. 90 tablet 1  . pseudoephedrine (SUDAFED) 120 MG 12 hr tablet Take 120 mg by mouth daily.    . SUMAtriptan (IMITREX) 50 MG tablet Take 1 tablet (50 mg total) by mouth every 2 (two) hours as needed for migraine. May repeat in 2 hours if headache persists or recurs. 10 tablet 0  . topiramate (TOPAMAX) 50 MG tablet Take 50 mg by mouth 2 (two) times daily.     No current facility-administered medications on file prior to visit.    BP 128/81 mmHg  Pulse 110  Temp(Src) 98.1 F (36.7 C) (Oral)  Resp 18  Ht 5' (1.524 m)  Wt 157 lb 6.4 oz (71.396 kg)  BMI 30.74 kg/m2  SpO2 100%  LMP 03/23/2014       Objective:   Physical Exam  Constitutional: She is oriented to person, place, and time. She appears well-developed and  well-nourished.  HENT:  Head: Normocephalic.  Left Ear: External ear normal.  Nose: Right sinus exhibits maxillary sinus tenderness and frontal sinus tenderness. Left sinus exhibits maxillary sinus tenderness and frontal sinus tenderness.  Eyes: No scleral icterus.  Cardiovascular: Normal rate, regular rhythm and normal heart sounds.   No murmur heard. Pulmonary/Chest: Effort normal and breath sounds normal. No respiratory distress. She has no wheezes.  Neurological: She is alert and oriented to person, place, and time.  Psychiatric: Judgment and thought content normal.  Flat affect          Assessment & Plan:  Sinusitis-  rx with Augmentin, this may be contributing to her overall fatigue.

## 2015-08-04 NOTE — Patient Instructions (Signed)
Start augmentin for your sinus infection. Call if symptoms worsen or if not improved in 3 days. Continue current dose of effexor. Complete lab work prior to leaving. You will be contacted about your referral for sleep study.

## 2015-08-04 NOTE — Progress Notes (Signed)
Pre visit review using our clinic review tool, if applicable. No additional management support is needed unless otherwise documented below in the visit note. 

## 2015-08-05 LAB — CBC WITH DIFFERENTIAL/PLATELET
BASOS PCT: 2.3 % (ref 0.0–3.0)
Basophils Absolute: 0.1 10*3/uL (ref 0.0–0.1)
Eosinophils Absolute: 0.2 10*3/uL (ref 0.0–0.7)
Eosinophils Relative: 2.8 % (ref 0.0–5.0)
HEMATOCRIT: 33.1 % — AB (ref 36.0–46.0)
Hemoglobin: 11 g/dL — ABNORMAL LOW (ref 12.0–15.0)
Lymphocytes Relative: 45.5 % (ref 12.0–46.0)
Lymphs Abs: 2.8 10*3/uL (ref 0.7–4.0)
MCHC: 33.2 g/dL (ref 30.0–36.0)
MCV: 88.4 fl (ref 78.0–100.0)
MONOS PCT: 6.2 % (ref 3.0–12.0)
Monocytes Absolute: 0.4 10*3/uL (ref 0.1–1.0)
NEUTROS ABS: 2.7 10*3/uL (ref 1.4–7.7)
Neutrophils Relative %: 43.2 % (ref 43.0–77.0)
PLATELETS: 313 10*3/uL (ref 150.0–400.0)
RBC: 3.74 Mil/uL — ABNORMAL LOW (ref 3.87–5.11)
RDW: 15.1 % (ref 11.5–15.5)
WBC: 6.2 10*3/uL (ref 4.0–10.5)

## 2015-08-05 LAB — VITAMIN D 25 HYDROXY (VIT D DEFICIENCY, FRACTURES): VITD: 30.44 ng/mL (ref 30.00–100.00)

## 2015-08-05 LAB — VITAMIN B12: Vitamin B-12: >1500 pg/mL — ABNORMAL HIGH (ref 211–911)

## 2015-08-06 ENCOUNTER — Encounter: Payer: Self-pay | Admitting: Family

## 2015-08-08 DIAGNOSIS — E559 Vitamin D deficiency, unspecified: Secondary | ICD-10-CM | POA: Insufficient documentation

## 2015-08-08 NOTE — Assessment & Plan Note (Signed)
Continue supplement, obtain vit D level.

## 2015-08-08 NOTE — Assessment & Plan Note (Signed)
Improved. Continue effexor.

## 2015-08-08 NOTE — Assessment & Plan Note (Signed)
Obtain follow up b12, cbc. Continue B12 injections.

## 2015-08-15 ENCOUNTER — Ambulatory Visit (INDEPENDENT_AMBULATORY_CARE_PROVIDER_SITE_OTHER): Payer: 59

## 2015-08-15 DIAGNOSIS — E538 Deficiency of other specified B group vitamins: Secondary | ICD-10-CM

## 2015-08-15 MED ORDER — CYANOCOBALAMIN 1000 MCG/ML IJ SOLN
1000.0000 ug | Freq: Once | INTRAMUSCULAR | Status: AC
  Administered 2015-08-15: 1000 ug via INTRAMUSCULAR

## 2015-08-19 DIAGNOSIS — G4733 Obstructive sleep apnea (adult) (pediatric): Secondary | ICD-10-CM | POA: Diagnosis not present

## 2015-08-28 ENCOUNTER — Other Ambulatory Visit: Payer: Self-pay | Admitting: *Deleted

## 2015-08-28 DIAGNOSIS — R400 Somnolence: Secondary | ICD-10-CM

## 2015-08-28 DIAGNOSIS — G4733 Obstructive sleep apnea (adult) (pediatric): Secondary | ICD-10-CM | POA: Diagnosis not present

## 2015-09-10 ENCOUNTER — Ambulatory Visit (INDEPENDENT_AMBULATORY_CARE_PROVIDER_SITE_OTHER): Payer: 59 | Admitting: Behavioral Health

## 2015-09-10 DIAGNOSIS — E538 Deficiency of other specified B group vitamins: Secondary | ICD-10-CM

## 2015-09-10 MED ORDER — CYANOCOBALAMIN 1000 MCG/ML IJ SOLN
1000.0000 ug | Freq: Once | INTRAMUSCULAR | Status: AC
  Administered 2015-09-10: 1000 ug via INTRAMUSCULAR

## 2015-09-10 NOTE — Progress Notes (Signed)
Pre visit review using our clinic review tool, if applicable. No additional management support is needed unless otherwise documented below in the visit note.  Patient tolerated injection well. Next injection scheduled for 10/07/15 at 3:30 PM.

## 2015-09-16 ENCOUNTER — Ambulatory Visit (INDEPENDENT_AMBULATORY_CARE_PROVIDER_SITE_OTHER): Payer: 59 | Admitting: Physician Assistant

## 2015-09-16 ENCOUNTER — Encounter: Payer: Self-pay | Admitting: Physician Assistant

## 2015-09-16 VITALS — BP 133/87 | HR 115 | Temp 98.4°F | Resp 16 | Ht 60.0 in | Wt 152.4 lb

## 2015-09-16 DIAGNOSIS — J019 Acute sinusitis, unspecified: Secondary | ICD-10-CM

## 2015-09-16 DIAGNOSIS — B9689 Other specified bacterial agents as the cause of diseases classified elsewhere: Secondary | ICD-10-CM

## 2015-09-16 MED ORDER — AMOXICILLIN-POT CLAVULANATE 875-125 MG PO TABS: 1.0000 | ORAL_TABLET | Freq: Two times a day (BID) | ORAL | Status: DC

## 2015-09-16 MED ORDER — PSEUDOEPHEDRINE-GUAIFENESIN ER 120-1200 MG PO TB12: 1.0000 | ORAL_TABLET | Freq: Two times a day (BID) | ORAL | Status: DC

## 2015-09-16 NOTE — Patient Instructions (Signed)
Please take antibiotic as directed.  Increase fluid intake.  Use Saline nasal spray.  Take a daily multivitamin. Use Mucinex-D as directed.  Place a humidifier in the bedroom.  Please call or return clinic if symptoms are not improving.  Sinusitis Sinusitis is redness, soreness, and swelling (inflammation) of the paranasal sinuses. Paranasal sinuses are air pockets within the bones of your face (beneath the eyes, the middle of the forehead, or above the eyes). In healthy paranasal sinuses, mucus is able to drain out, and air is able to circulate through them by way of your nose. However, when your paranasal sinuses are inflamed, mucus and air can become trapped. This can allow bacteria and other germs to grow and cause infection. Sinusitis can develop quickly and last only a short time (acute) or continue over a long period (chronic). Sinusitis that lasts for more than 12 weeks is considered chronic.  CAUSES  Causes of sinusitis include:  Allergies.  Structural abnormalities, such as displacement of the cartilage that separates your nostrils (deviated septum), which can decrease the air flow through your nose and sinuses and affect sinus drainage.  Functional abnormalities, such as when the small hairs (cilia) that line your sinuses and help remove mucus do not work properly or are not present. SYMPTOMS  Symptoms of acute and chronic sinusitis are the same. The primary symptoms are pain and pressure around the affected sinuses. Other symptoms include:  Upper toothache.  Earache.  Headache.  Bad breath.  Decreased sense of smell and taste.  A cough, which worsens when you are lying flat.  Fatigue.  Fever.  Thick drainage from your nose, which often is green and may contain pus (purulent).  Swelling and warmth over the affected sinuses. DIAGNOSIS  Your caregiver will perform a physical exam. During the exam, your caregiver may:  Look in your nose for signs of abnormal growths in  your nostrils (nasal polyps).  Tap over the affected sinus to check for signs of infection.  View the inside of your sinuses (endoscopy) with a special imaging device with a light attached (endoscope), which is inserted into your sinuses. If your caregiver suspects that you have chronic sinusitis, one or more of the following tests may be recommended:  Allergy tests.  Nasal culture A sample of mucus is taken from your nose and sent to a lab and screened for bacteria.  Nasal cytology A sample of mucus is taken from your nose and examined by your caregiver to determine if your sinusitis is related to an allergy. TREATMENT  Most cases of acute sinusitis are related to a viral infection and will resolve on their own within 10 days. Sometimes medicines are prescribed to help relieve symptoms (pain medicine, decongestants, nasal steroid sprays, or saline sprays).  However, for sinusitis related to a bacterial infection, your caregiver will prescribe antibiotic medicines. These are medicines that will help kill the bacteria causing the infection.  Rarely, sinusitis is caused by a fungal infection. In theses cases, your caregiver will prescribe antifungal medicine. For some cases of chronic sinusitis, surgery is needed. Generally, these are cases in which sinusitis recurs more than 3 times per year, despite other treatments. HOME CARE INSTRUCTIONS   Drink plenty of water. Water helps thin the mucus so your sinuses can drain more easily.  Use a humidifier.  Inhale steam 3 to 4 times a day (for example, sit in the bathroom with the shower running).  Apply a warm, moist washcloth to your face 3 to  4 times a day, or as directed by your caregiver.  Use saline nasal sprays to help moisten and clean your sinuses.  Take over-the-counter or prescription medicines for pain, discomfort, or fever only as directed by your caregiver. SEEK IMMEDIATE MEDICAL CARE IF:  You have increasing pain or severe  headaches.  You have nausea, vomiting, or drowsiness.  You have swelling around your face.  You have vision problems.  You have a stiff neck.  You have difficulty breathing. MAKE SURE YOU:   Understand these instructions.  Will watch your condition.  Will get help right away if you are not doing well or get worse. Document Released: 10/04/2005 Document Revised: 12/27/2011 Document Reviewed: 10/19/2011 [facility] Patient Information 2014 [place], [place].

## 2015-09-16 NOTE — Assessment & Plan Note (Signed)
Rx Augmentin.  Increase fluids.  Rest.  Saline nasal spray.  Probiotic.  Mucinex-D as directed.  Humidifier in bedroom.  Call or return to clinic if symptoms are not improving.

## 2015-09-16 NOTE — Progress Notes (Signed)
Pre visit review using our clinic review tool, if applicable. No additional management support is needed unless otherwise documented below in the visit note/SLS  

## 2015-09-16 NOTE — Progress Notes (Signed)
History of Present Illness: Terri Johnson is a 49 y.o. female who present to the clinic today complaining of sinus pressure, sinus pain and nasal congestion. Patient endorses facial pain (left-sided), ear pressure and fatigue.  Patient denies fever, chills, chest congestion or SOB. Has history of asthma but denies change from baseline.    History: Past Medical History  Diagnosis Date  . Asthma   . History of chicken pox   . Depression   . Allergy   . GERD (gastroesophageal reflux disease)   . Migraines   . B12 deficiency 05/14/2015    Current outpatient prescriptions:  .  albuterol (PROAIR HFA) 108 (90 BASE) MCG/ACT inhaler, Inhale 2 puffs into the lungs every 6 (six) hours as needed for wheezing or shortness of breath., Disp: 3.7 g, Rfl: 3 .  aspirin 81 MG tablet, Take 81 mg by mouth daily., Disp: , Rfl:  .  betamethasone valerate ointment (VALISONE) 0.1 %, Apply small amount to ear as needed for itching/scaling., Disp: , Rfl:  .  buPROPion (WELLBUTRIN XL) 300 MG 24 hr tablet, Take 1 tablet (300 mg total) by mouth daily., Disp: 90 tablet, Rfl: 1 .  cetirizine (ZYRTEC) 10 MG tablet, Take 10 mg by mouth daily., Disp: , Rfl:  .  CYANOCOBALAMIN IJ, Inject 1,000 mcg as directed every 30 (thirty) days., Disp: , Rfl:  .  Ergocalciferol (VITAMIN D2) 2000 UNITS TABS, Take 1 tablet by mouth daily., Disp: , Rfl:  .  esomeprazole (NEXIUM) 40 MG capsule, Take 1 capsule (40 mg total) by mouth daily at 12 noon., Disp: 90 capsule, Rfl: 1 .  montelukast (SINGULAIR) 10 MG tablet, Take 1 tablet (10 mg total) by mouth at bedtime., Disp: 90 tablet, Rfl: 1 .  SUMAtriptan (IMITREX) 50 MG tablet, Take 1 tablet (50 mg total) by mouth every 2 (two) hours as needed for migraine. May repeat in 2 hours if headache persists or recurs., Disp: 10 tablet, Rfl: 0 .  topiramate (TOPAMAX) 50 MG tablet, Take 50 mg by mouth 2 (two) times daily., Disp: , Rfl:  .  venlafaxine XR (EFFEXOR XR) 37.5 MG 24 hr capsule, 2 tabs PO  daily, Disp: 60 capsule, Rfl: 5 .  amoxicillin-clavulanate (AUGMENTIN) 875-125 MG tablet, Take 1 tablet by mouth 2 (two) times daily., Disp: 14 tablet, Rfl: 0 .  Pseudoephedrine-Guaifenesin (MUCINEX D) [phone] MG TB12, Take 1 tablet by mouth 2 (two) times daily., Disp: 20 each, Rfl: 0 No Known Allergies Family History  Problem Relation Age of Onset  . Hyperlipidemia Mother   . Heart disease Mother   . Hypertension Mother   . Depression Mother   . Parkinson's disease Father   . Depression Maternal Aunt   . Alcohol abuse Maternal Uncle   . Depression Maternal Grandmother   . Heart disease Maternal Grandfather   . Hypertension Maternal Grandfather   . Alcohol abuse Maternal Grandfather   . AVM Maternal Aunt     congenital  . Aneurysm Maternal Aunt    Social History   Social History  . Marital Status: Married    Spouse Name: N/A  . Number of Children: N/A  . Years of Education: N/A   Social History Main Topics  . Smoking status: Never Smoker   . Smokeless tobacco: None  . Alcohol Use: None  . Drug Use: None  . Sexual Activity: Not Asked   Other Topics Concern  . 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    Has associates degree   Enjoys painting, animals, antiquing, shopping, reading, movies       Review of Systems: See HPI.  All other ROS are negative.  Physical Examination: BP 133/87 mmHg  Pulse 115  Temp(Src) 98.4 F (36.9 C) (Oral)  Resp 16  Ht 5' (1.524 m)  Wt 152 lb 6 oz (69.117 kg)  BMI 29.76 kg/m2  SpO2 100%  LMP 03/23/2014  General appearance: alert, cooperative and appears stated age Head: Normocephalic, without obvious abnormality, atraumatic, sinuses tender to percussion Eyes: conjunctivae/corneas clear. PERRL, EOM's intact. Fundi benign. Ears: normal TM's and external ear canals both ears Nose: moderate congestion, turbinates swollen, sinus tenderness left Throat: lips,  mucosa, and tongue normal; teeth and gums normal Neck: no adenopathy, no carotid bruit, no JVD, supple, symmetrical, trachea midline and thyroid not enlarged, symmetric, no tenderness/mass/nodules Lungs: clear to auscultation bilaterally Chest wall: no tenderness  Assessment/Plan: Acute bacterial sinusitis Rx Augmentin.  Increase fluids.  Rest.  Saline nasal spray.  Probiotic.  Mucinex-D as directed.  Humidifier in bedroom.  Call or return to clinic if symptoms are not improving.

## 2015-09-23 ENCOUNTER — Other Ambulatory Visit: Payer: Self-pay | Admitting: Family

## 2015-10-07 ENCOUNTER — Ambulatory Visit (INDEPENDENT_AMBULATORY_CARE_PROVIDER_SITE_OTHER): Payer: 59

## 2015-10-07 DIAGNOSIS — E538 Deficiency of other specified B group vitamins: Secondary | ICD-10-CM

## 2015-10-07 MED ORDER — CYANOCOBALAMIN 1000 MCG/ML IJ SOLN
1000.0000 ug | Freq: Once | INTRAMUSCULAR | Status: AC
  Administered 2015-10-07: 1000 ug via INTRAMUSCULAR

## 2015-10-07 NOTE — Progress Notes (Signed)
Pre visit review using our clinic review tool, if applicable. No additional management support is needed unless otherwise documented below in the visit note.  Patient in for B12 injection. Given IM Left arm . Patient tolerated well.

## 2015-11-07 ENCOUNTER — Ambulatory Visit (INDEPENDENT_AMBULATORY_CARE_PROVIDER_SITE_OTHER): Payer: 59 | Admitting: *Deleted

## 2015-11-07 DIAGNOSIS — E538 Deficiency of other specified B group vitamins: Secondary | ICD-10-CM

## 2015-11-07 MED ORDER — CYANOCOBALAMIN 1000 MCG/ML IJ SOLN
1000.0000 ug | Freq: Once | INTRAMUSCULAR | Status: AC
  Administered 2015-11-07: 1000 ug via INTRAMUSCULAR

## 2015-11-07 NOTE — Progress Notes (Signed)
Pre visit review using our clinic review tool, if applicable. No additional management support is needed unless otherwise documented below in the visit note.  Pt in for B12 injection. Injection given R deltoid, pt tolerated well.   Next appt scheduled 12/12/2015 @ 3:45pm.   Dorrene German, RN

## 2015-11-10 ENCOUNTER — Ambulatory Visit: Payer: 59 | Admitting: Family

## 2015-11-17 ENCOUNTER — Encounter: Payer: Self-pay | Admitting: Family

## 2015-11-17 ENCOUNTER — Ambulatory Visit (INDEPENDENT_AMBULATORY_CARE_PROVIDER_SITE_OTHER): Payer: 59 | Admitting: Family

## 2015-11-17 VITALS — BP 134/76 | HR 106 | Temp 98.2°F | Resp 18 | Ht 60.0 in | Wt 156.6 lb

## 2015-11-17 DIAGNOSIS — R400 Somnolence: Secondary | ICD-10-CM

## 2015-11-17 NOTE — Patient Instructions (Addendum)
Avoid driving while sleepy. You will be contacted about your referral. Please let me know if you have not heard back in 1 week about your appointment.

## 2015-11-17 NOTE — Progress Notes (Signed)
Subjective:    Patient ID: Terri Johnson, female    DOB: [birth year], 50 y.o.   MRN: [id number]  HPI  Anxiety/depression- reports good mood.   Somnolence- pt reports that she "stays tired all the time."  Reports that she has fallen asleep while driving. She had a normal sleep study 11/16.  She reports that she goes to bed around 10 PM, wakes at [facility].  Falls asleep on the couch.  Feels like her sleep is sound.     Review of Systems See HPI Past Medical History  Diagnosis Date  . Asthma   . History of chicken pox   . Depression   . Allergy   . GERD (gastroesophageal reflux disease)   . Migraines   . B12 deficiency 05/14/2015    Social History   Social History  . Marital Status: Married    Spouse Name: N/A  . Number of Children: N/A  . Years of Education: N/A   Occupational History  . Not on file.   Social History Main Topics  . Smoking status: Never Smoker   . Smokeless tobacco: Not on file  . Alcohol Use: Not on file  . Drug Use: Not on file  . Sexual Activity: Not on file   Other Topics Concern  . 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    Has associates degree   Enjoys painting, animals, antiquing, shopping, reading, movies       Past Surgical History  Procedure Laterality Date  . Myringotomy Bilateral     twice each.  Eual Fines repair - left Left   . Cholecystectomy  2007  . Tonsillectomy and adenoidectomy  1976  . Inner ear surgery      Pt states she had her "eardrum rebuilt". Has had 8 sets of tubes in ears through adolecense  . Tubal ligation  2001  . Cesarean section  1991    Family History  Problem Relation Age of Onset  . Hyperlipidemia Mother   . Heart disease Mother   . Hypertension Mother   . Depression Mother   . Parkinson's disease Father   . Depression Maternal Aunt   . Alcohol abuse Maternal Uncle   . Depression Maternal Grandmother   . Heart  disease Maternal Grandfather   . Hypertension Maternal Grandfather   . Alcohol abuse Maternal Grandfather   . AVM Maternal Aunt     congenital  . Aneurysm Maternal Aunt     No Known Allergies  Current Outpatient Prescriptions on File Prior to Visit  Medication Sig Dispense Refill  . albuterol (PROAIR HFA) 108 (90 BASE) MCG/ACT inhaler Inhale 2 puffs into the lungs every 6 (six) hours as needed for wheezing or shortness of breath. 3.7 g 3  . aspirin 81 MG tablet Take 81 mg by mouth daily.    . betamethasone valerate ointment (VALISONE) 0.1 % Apply small amount to ear as needed for itching/scaling.    Marland Kitchen buPROPion (WELLBUTRIN XL) 300 MG 24 hr tablet TAKE 1 TABLET BY MOUTH DAILY. 90 tablet 1  . cetirizine (ZYRTEC) 10 MG tablet Take 10 mg by mouth daily.    . CYANOCOBALAMIN IJ Inject 1,000 mcg as directed every 30 (thirty) days.    . Ergocalciferol (VITAMIN D2) 2000 UNITS TABS Take 1 tablet by mouth daily.    Marland Kitchen esomeprazole (NEXIUM) 40 MG capsule TAKE 1 CAPSULE BY  MOUTH DAILY AT 12 NOON. 90 capsule 1  . montelukast (SINGULAIR) 10 MG tablet Take 1 tablet (10 mg total) by mouth at bedtime. 90 tablet 1  . topiramate (TOPAMAX) 50 MG tablet Take 50 mg by mouth 2 (two) times daily.    Marland Kitchen venlafaxine XR (EFFEXOR XR) 37.5 MG 24 hr capsule 2 tabs PO daily 60 capsule 5   No current facility-administered medications on file prior to visit.    BP 134/76 mmHg  Pulse 106  Temp(Src) 98.2 F (36.8 C) (Oral)  Resp 18  Ht 5' (1.524 m)  Wt 156 lb 9.6 oz (71.033 kg)  BMI 30.58 kg/m2  SpO2 100%  LMP 03/23/2014       Objective:   Physical Exam  Constitutional: She appears well-developed and well-nourished.  Tired appearing white female.   Cardiovascular: Normal rate, regular rhythm and normal heart sounds.   No murmur heard. Pulmonary/Chest: Effort normal and breath sounds normal. No respiratory distress. She has no wheezes.  Musculoskeletal: She exhibits no edema.  Psychiatric: She has a normal  mood and affect. Her behavior is normal. Judgment and thought content normal.          Assessment & Plan:  Daytime somnolence-  Uncontrolled. Will refer to Sleep specialist to evaluate pt for narcolepsy. Pt scored 16 on epsworth sleepiness scale. Pt advised to avoid driving when sleepy.

## 2015-11-17 NOTE — Progress Notes (Signed)
Pre visit review using our clinic review tool, if applicable. No additional management support is needed unless otherwise documented below in the visit note. 

## 2015-11-28 ENCOUNTER — Telehealth: Payer: Self-pay | Admitting: Family

## 2015-11-28 NOTE — Telephone Encounter (Signed)
Opened in error

## 2015-12-02 ENCOUNTER — Telehealth: Payer: Self-pay | Admitting: Family

## 2015-12-02 DIAGNOSIS — R400 Somnolence: Secondary | ICD-10-CM

## 2015-12-02 NOTE — Telephone Encounter (Signed)
Please let pt know that I would like to send her to see Dr. Elsworth Soho for evaluation of possible narcolepsy.

## 2015-12-02 NOTE — Telephone Encounter (Signed)
Left detailed message on pt's cell# and to call if any questions. 

## 2015-12-04 MED FILL — MONTELUKAST SOD 10 MG TAB: 10 | 90 days supply | Qty: 90 | Fill #1

## 2015-12-12 ENCOUNTER — Ambulatory Visit (INDEPENDENT_AMBULATORY_CARE_PROVIDER_SITE_OTHER): Payer: 59 | Admitting: *Deleted

## 2015-12-12 DIAGNOSIS — E538 Deficiency of other specified B group vitamins: Secondary | ICD-10-CM | POA: Diagnosis not present

## 2015-12-12 MED ORDER — CYANOCOBALAMIN 1000 MCG/ML IJ SOLN
1000.0000 ug | Freq: Once | INTRAMUSCULAR | Status: AC
  Administered 2015-12-12: 1000 ug via INTRAMUSCULAR

## 2015-12-12 NOTE — Progress Notes (Signed)
Pre visit review using our clinic review tool, if applicable. No additional management support is needed unless otherwise documented below in the visit note.  Pt tolerated injection well.   Next appt: 01/09/16  Jnya Brossard J Kolbie Lepkowski, RN  

## 2015-12-25 MED FILL — ESOMEPRAZOLE MAG DR 40 MG C: 40 | 90 days supply | Qty: 90 | Fill #1

## 2015-12-25 MED FILL — BUPROPION HCL XL 300 MG TAB: 300 | 90 days supply | Qty: 90 | Fill #1

## 2016-01-08 ENCOUNTER — Ambulatory Visit (INDEPENDENT_AMBULATORY_CARE_PROVIDER_SITE_OTHER): Payer: 59 | Admitting: Pulmonary Disease

## 2016-01-08 ENCOUNTER — Encounter (INDEPENDENT_AMBULATORY_CARE_PROVIDER_SITE_OTHER): Payer: Self-pay

## 2016-01-08 ENCOUNTER — Encounter: Payer: Self-pay | Admitting: Pulmonary Disease

## 2016-01-08 VITALS — BP 140/78 | HR 111 | Ht 60.0 in | Wt 159.2 lb

## 2016-01-08 DIAGNOSIS — J4520 Mild intermittent asthma, uncomplicated: Secondary | ICD-10-CM

## 2016-01-08 DIAGNOSIS — G4710 Hypersomnia, unspecified: Secondary | ICD-10-CM | POA: Insufficient documentation

## 2016-01-08 NOTE — Assessment & Plan Note (Addendum)
Patient has been sleepy/hypersomnia for the past 2 years. Hypersomnia affects her functionality. She sleeps 7 hours per night. She wakes up unrefreshed. She snores, gasps, chokes, has witnessed apneas. She works 6:15 AM until 2:45 PM Monday to Friday at [employer]. 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 She struggles driving back home.  She tosses and turns. No other abnormal behavior and sleep.  Home sleep study in 08/2015-- AHI 4.8.   Plan: 1. Patient will need a home sleep study. Her home sleep study initially, she did not have enough sleep. Only slept x 3 hrs. 2. We will order auto CPAP, 5-15 cm water if she study is positive. She will need a mask fitting session. 3. We'll need to see her sooner if sleep study is negative. 4. Anticipate no problems with CPAP.

## 2016-01-08 NOTE — Assessment & Plan Note (Signed)
Asthma has been stable. Childhood asthma. Alb prn.

## 2016-01-08 NOTE — Patient Instructions (Signed)
1. We will schedule you for home sleep study. 2. Hopefully that is positive and we will order you CPAP machine after that. 3. Give [place]  a call if you are having issues with your  CPAP.   Return to clinic in 2 mos.

## 2016-01-08 NOTE — Progress Notes (Signed)
Subjective:    Patient ID: Terri Johnson, female    DOB: 1965-11-21, 50 y.o.   MRN: [id number]  HPI   This is the case of Terri Johnson, 50 y.o. Female, who was referred by Debbrah Alar in consultation regarding hypersomnia.   As you very well know, patient Is here for hypersomnia. Nonsmoker. She has had asthma since she was a child. Stable. No issues with it.  Patient has been sleepy/hypersomnia for the past 2 years. Hypersomnia affects her functionality. She sleeps 7 hours per night. She wakes up unrefreshed. She snores, gasps, chokes, has witnessed apneas. 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 She struggles driving back home.  She tosses and turns. No other abnormal behavior and sleep.      Review of Systems  Constitutional: Negative.  Negative for fever and unexpected weight change.  HENT: Positive for congestion. Negative for dental problem, ear pain, nosebleeds, postnasal drip, rhinorrhea, sinus pressure, sneezing, sore throat and trouble swallowing.   Eyes: Negative for redness and itching.  Respiratory: Positive for shortness of breath. Negative for cough, chest tightness and wheezing.   Cardiovascular: Negative.  Negative for palpitations and leg swelling.  Gastrointestinal: Negative.  Negative for nausea and vomiting.  Endocrine: Negative.   Genitourinary: Negative.  Negative for dysuria.  Musculoskeletal: Negative.  Negative for joint swelling.  Skin: Negative.  Negative for rash.  Allergic/Immunologic: Positive for environmental allergies.  Neurological: Positive for headaches.  Hematological: Negative.  Does not bruise/bleed easily.  Psychiatric/Behavioral: Negative.  Negative for dysphoric mood. The patient is not nervous/anxious.        Past Medical History  Diagnosis Date  . Asthma   . History of chicken pox   . Depression   .  Allergy   . GERD (gastroesophageal reflux disease)   . Migraines   . B12 deficiency 05/14/2015  (-) CA, DVT  Family History  Problem Relation Age of Onset  . Hyperlipidemia Mother   . Heart disease Mother   . Hypertension Mother   . Depression Mother   . Parkinson's disease Father   . Depression Maternal Aunt   . Alcohol abuse Maternal Uncle   . Depression Maternal Grandmother   . Heart disease Maternal Grandfather   . Hypertension Maternal Grandfather   . Alcohol abuse Maternal Grandfather   . AVM Maternal Aunt     congenital  . Aneurysm Maternal Aunt      Past Surgical History  Procedure Laterality Date  . Myringotomy Bilateral     twice each.  Eual Fines repair - left Left   . Cholecystectomy  2007  . Tonsillectomy and adenoidectomy  1976  . Inner ear surgery      Pt states she had her "eardrum rebuilt". Has had 8 sets of tubes in ears through adolecense  . Tubal ligation  2001  . Cesarean section  1991    Social History   Social History  . Marital Status: Married    Spouse Name: N/A  . Number of Children: N/A  . Years of Education: N/A   Occupational History  . Not on file.   Social History Main Topics  . Smoking status: Never Smoker   . Smokeless tobacco: Not on file  . Alcohol Use: Not on file  . Drug Use: Not on file  . Sexual Activity: Not on file  Other Topics Concern  . 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    Has associates degree   Enjoys painting, animals, antiquing, shopping, reading, movies        No Known Allergies   Outpatient Prescriptions Prior to Visit  Medication Sig Dispense Refill  . albuterol (PROAIR HFA) 108 (90 BASE) MCG/ACT inhaler Inhale 2 puffs into the lungs every 6 (six) hours as needed for wheezing or shortness of breath. 3.7 g 3  . betamethasone valerate ointment (VALISONE) 0.1 % Apply small amount to ear as needed for  itching/scaling.    Marland Kitchen buPROPion (WELLBUTRIN XL) 300 MG 24 hr tablet TAKE 1 TABLET BY MOUTH DAILY. 90 tablet 1  . cetirizine (ZYRTEC) 10 MG tablet Take 10 mg by mouth daily.    . CYANOCOBALAMIN IJ Inject 1,000 mcg as directed every 30 (thirty) days.    . Ergocalciferol (VITAMIN D2) 2000 UNITS TABS Take 1 tablet by mouth daily.    Marland Kitchen esomeprazole (NEXIUM) 40 MG capsule TAKE 1 CAPSULE BY MOUTH DAILY AT 12 NOON. 90 capsule 1  . montelukast (SINGULAIR) 10 MG tablet Take 1 tablet (10 mg total) by mouth at bedtime. 90 tablet 1  . topiramate (TOPAMAX) 50 MG tablet Take 50 mg by mouth 2 (two) times daily.    Marland Kitchen venlafaxine XR (EFFEXOR XR) 37.5 MG 24 hr capsule 2 tabs PO daily 60 capsule 5  . aspirin 81 MG tablet Take 81 mg by mouth daily. Reported on 01/08/2016     No facility-administered medications prior to visit.   No orders of the defined types were placed in this encounter.       Objective:   Physical Exam  Vitals:  Filed Vitals:   01/08/16 1531  BP: 140/78  Pulse: 111  Height: 5' (1.524 m)  Weight: 159 lb 3.2 oz (72.213 kg)  SpO2: 97%    Constitutional/General:  Pleasant, well-nourished, well-developed, not in any distress,  Comfortably seating.  Well kempt  Body mass index is 31.09 kg/(m^2). Wt Readings from Last 3 Encounters:  01/08/16 159 lb 3.2 oz (72.213 kg)  11/17/15 156 lb 9.6 oz (71.033 kg)  09/16/15 152 lb 6 oz (69.117 kg)      HEENT: Pupils equal and reactive to light and accommodation. Anicteric sclerae. Normal nasal mucosa.   No oral  lesions,  mouth clear,  oropharynx clear, no postnasal drip. (-) Oral thrush. No dental caries.  Airway - Mallampati class III  Neck: No masses. Midline trachea. No JVD, (-) LAD. (-) bruits appreciated.  Respiratory/Chest: Grossly normal chest. (-) deformity. (-) Accessory muscle use.  Symmetric expansion. (-) Tenderness on palpation.  Resonant on percussion.  Diminished BS on both lower lung zones. (-) wheezing, crackles,  rhonchi (-) egophony  Cardiovascular: Regular rate and  rhythm, heart sounds normal, no murmur or gallops, no peripheral edema  Gastrointestinal:  Normal bowel sounds. Soft, non-tender. No hepatosplenomegaly.  (-) masses.   Musculoskeletal:  Normal muscle tone. Normal gait.   Extremities: Grossly normal. (-) clubbing, cyanosis.  (-) edema  Skin: (-) rash,lesions seen.   Neurological/Psychiatric : alert, oriented to time, place, person. Normal mood and affect            Assessment & Plan:  Hypersomnia Patient has been sleepy/hypersomnia for the past 2 years. Hypersomnia affects her functionality. She sleeps 7 hours per night. She wakes up unrefreshed. She  snores, gasps, chokes, has witnessed apneas. 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 She struggles driving back home.  She tosses and turns. No other abnormal behavior and sleep.  Home sleep study in 08/2015-- AHI 4.8.   Plan: 1. Patient will need a home sleep study. Her home sleep study initially, she did not have enough sleep. Only slept x 3 hrs. 2. We will order auto CPAP, 5-15 cm water if she study is positive. She will need a mask fitting session. 3. We'll need to see her sooner if sleep study is negative. 4. Anticipate no problems with CPAP.  Asthma Asthma has been stable. Childhood asthma. Alb prn.     Thank you very much for letting me participate in this patient's care. Please do not hesitate to give me a call if you have any questions or concerns regarding the treatment plan.   Patient will follow up with me in 2 mos.     Monica Becton, MD 01/08/2016   4:45 PM Pulmonary and [place] Pager: [phone] Office: [phone], Fax: [phone]

## 2016-01-09 ENCOUNTER — Ambulatory Visit (INDEPENDENT_AMBULATORY_CARE_PROVIDER_SITE_OTHER): Payer: 59 | Admitting: Behavioral Health

## 2016-01-09 DIAGNOSIS — E538 Deficiency of other specified B group vitamins: Secondary | ICD-10-CM

## 2016-01-09 MED ORDER — CYANOCOBALAMIN 1000 MCG/ML IJ SOLN
1000.0000 ug | Freq: Once | INTRAMUSCULAR | Status: AC
  Administered 2016-01-09: 1000 ug via INTRAMUSCULAR

## 2016-01-09 NOTE — Progress Notes (Signed)
Pre visit review using our clinic review tool, if applicable. No additional management support is needed unless otherwise documented below in the visit note.  Patient in office for B12 injection. IM given in Left Deltoid. Patient tolerated injection well. Next appointment 02/10/16 at 3:30 PM.

## 2016-01-20 DIAGNOSIS — J45901 Unspecified asthma with (acute) exacerbation: Secondary | ICD-10-CM | POA: Diagnosis not present

## 2016-02-10 ENCOUNTER — Ambulatory Visit: Payer: 59

## 2016-02-17 ENCOUNTER — Telehealth: Payer: Self-pay | Admitting: Pulmonary Disease

## 2016-02-17 ENCOUNTER — Ambulatory Visit (INDEPENDENT_AMBULATORY_CARE_PROVIDER_SITE_OTHER): Payer: 59 | Admitting: *Deleted

## 2016-02-17 DIAGNOSIS — G4710 Hypersomnia, unspecified: Secondary | ICD-10-CM

## 2016-02-17 DIAGNOSIS — E538 Deficiency of other specified B group vitamins: Secondary | ICD-10-CM | POA: Diagnosis not present

## 2016-02-17 MED ORDER — CYANOCOBALAMIN 1000 MCG/ML IJ SOLN
1000.0000 ug | Freq: Once | INTRAMUSCULAR | Status: AC
  Administered 2016-02-17: 1000 ug via INTRAMUSCULAR

## 2016-02-17 NOTE — Progress Notes (Signed)
Pre visit review using our clinic review tool, if applicable. No additional management support is needed unless otherwise documented below in the visit note.  Pt tolerated injection well.   Next appt: 03/19/16  Dorrene German, RN

## 2016-02-17 NOTE — Telephone Encounter (Signed)
Spoke with pt, states she had a sleep test through [facility] in mid April and has not received results from this yet.   Called [facility], office is closed.  wcb tomorrow morning to track down results.

## 2016-02-18 NOTE — Telephone Encounter (Signed)
Received call from pt. Pt had a HST in 08/2015, then saw Dr. Corrie Dandy and was advised to do another HST as the last one was negative. During OV with [facility] a sleep study order was not placed. New order placed for HST. Pt verbalized understanding and denied any further questions or concerns at this time.    Patient Instructions       1. We will schedule you for home sleep study. 2. Hopefully that is positive and we will order you CPAP machine after that. 3. Give [place]  a call if you are having issues with your  CPAP.   Return to clinic in 2 mos.

## 2016-02-18 NOTE — Telephone Encounter (Signed)
?   What sleep test through [facility]  We did not order anything through them  Looks like we ordered HST  I do not see that this was done? LMTCB to get more info from the pt

## 2016-02-21 IMAGING — CT CT HEAD W/O CM
1 series · 16 of 30 positions shown, 20 images · non-contrast
Comparison: None.

CLINICAL DATA: Pt co left side headache x 2 months, left eye vision
lost

EXAM:
CT HEAD WITHOUT CONTRAST
TECHNIQUE: Contiguous axial images were obtained from the base of the skull
through the vertex without intravenous contrast.

[Series 2: head 4.8 h37s · axial · 0.41mm/px · z∈[-112,+21]mm · 16 of 32 slices shown, 20 images]
[im 2/32  brain]
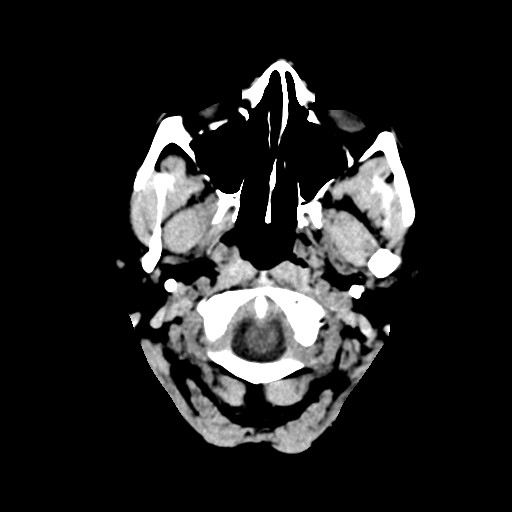
[im 2/32  bone]
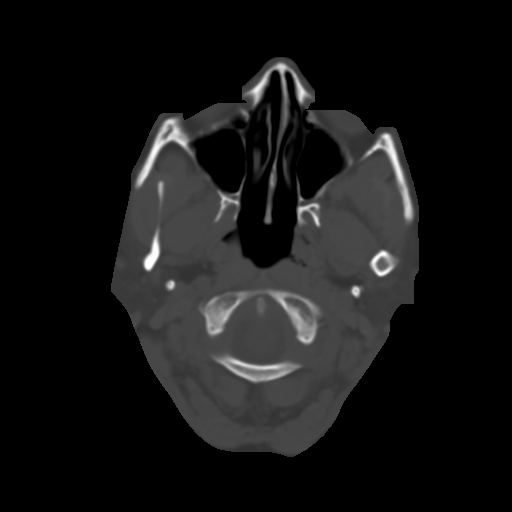
[im 4/32  brain]
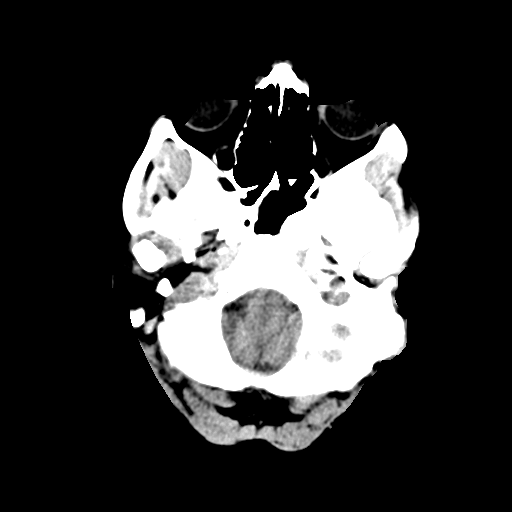
[im 6/32  brain]
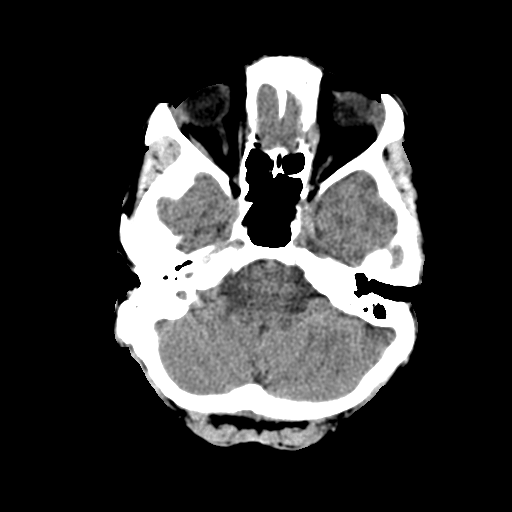
[im 8/32  brain]
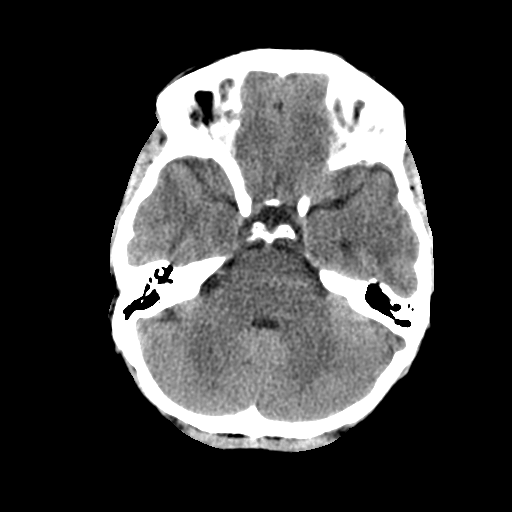
[im 9/32  brain]
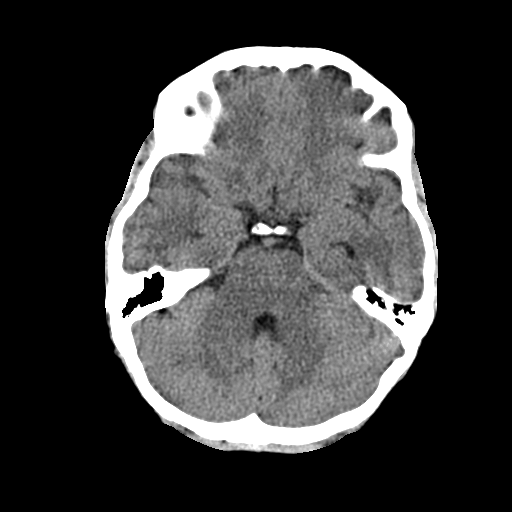
[im 9/32  bone]
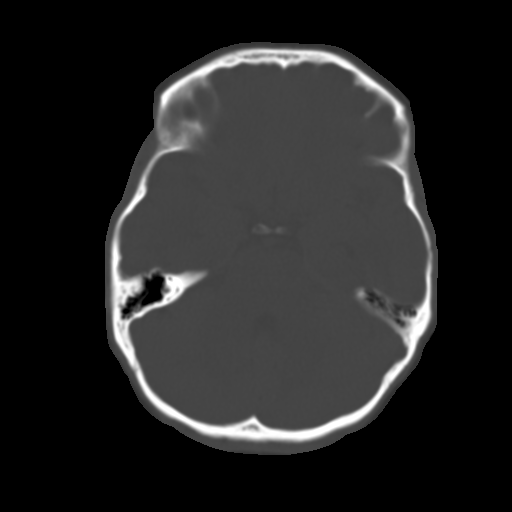
[im 11/32  brain]
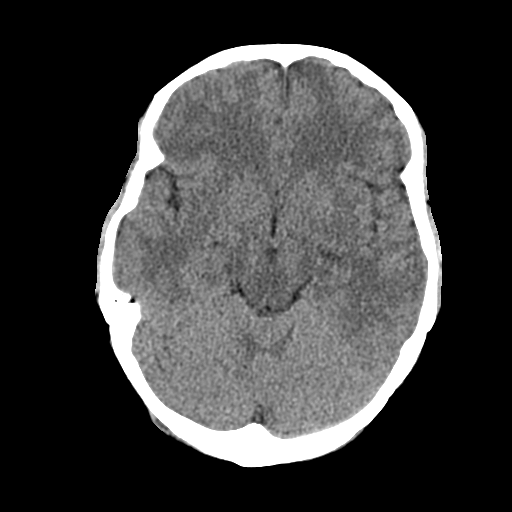
[im 13/32  brain]
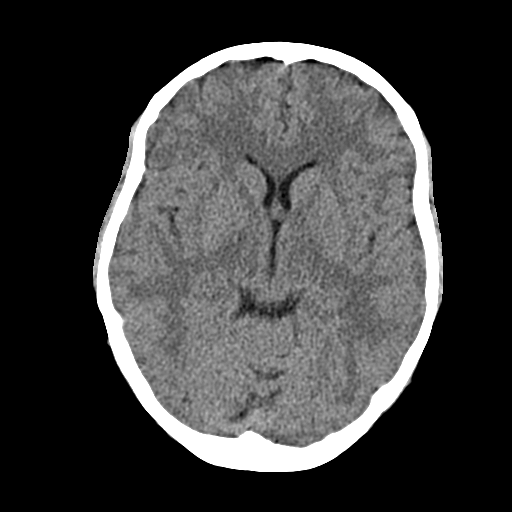
[im 15/32  brain]
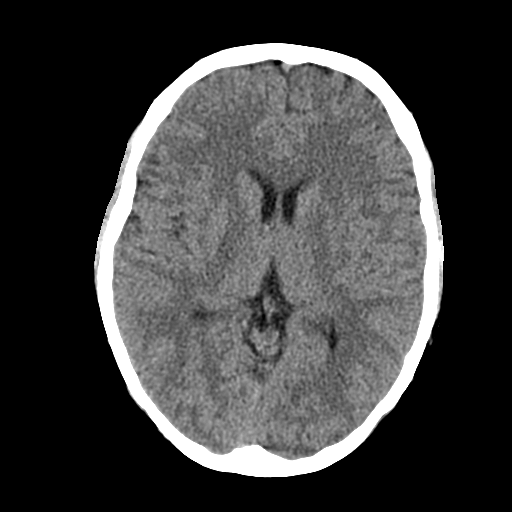
[im 17/32  brain]
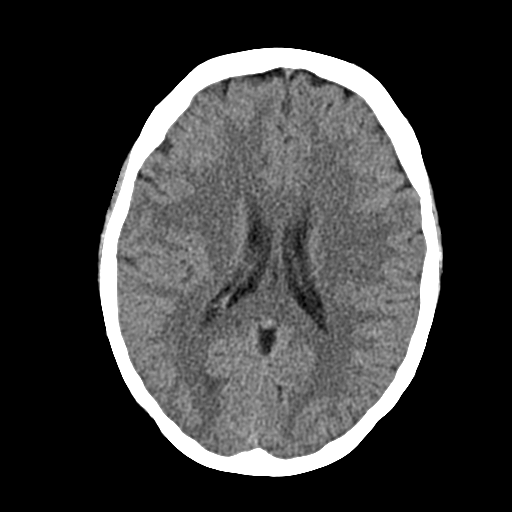
[im 17/32  bone]
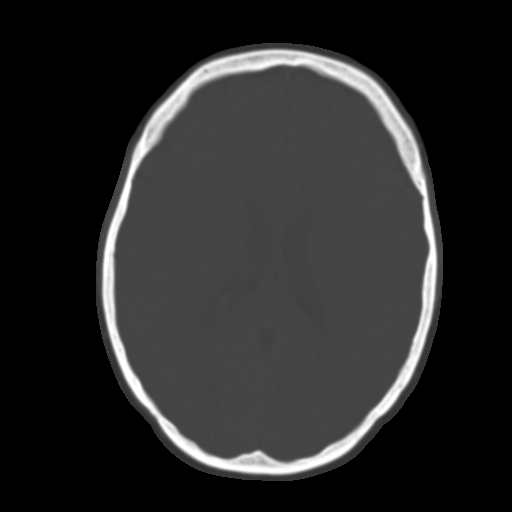
[im 19/32  brain]
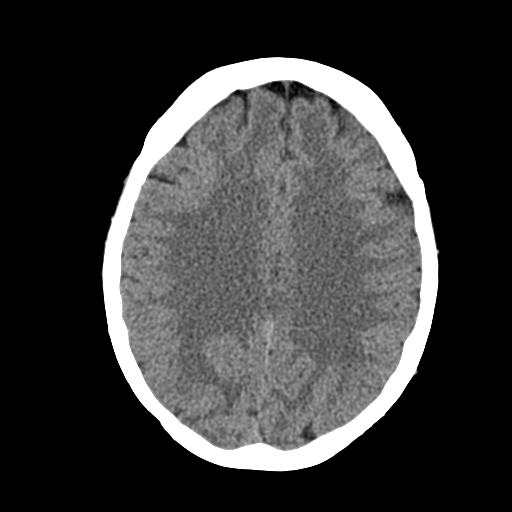
[im 21/32  brain]
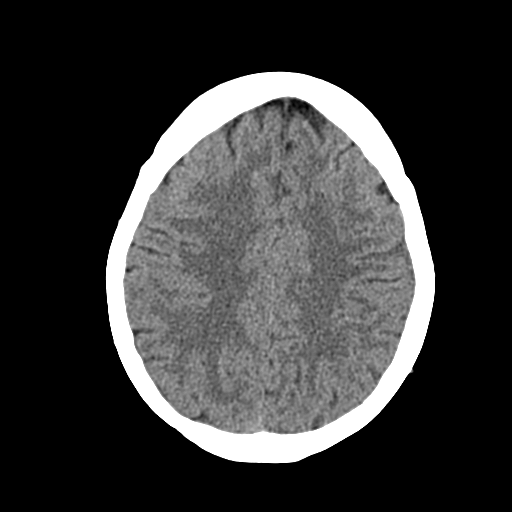
[im 23/32  brain]
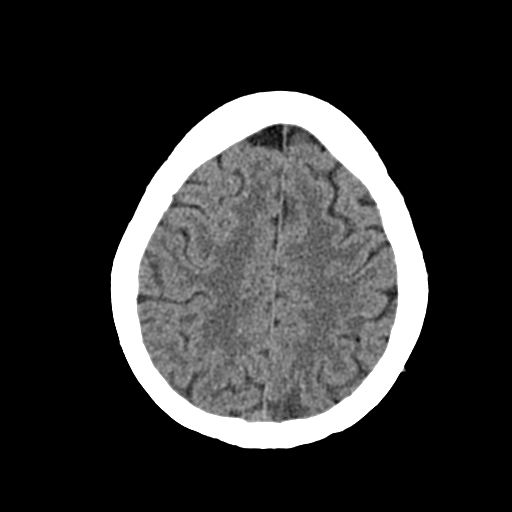
[im 24/32  brain]
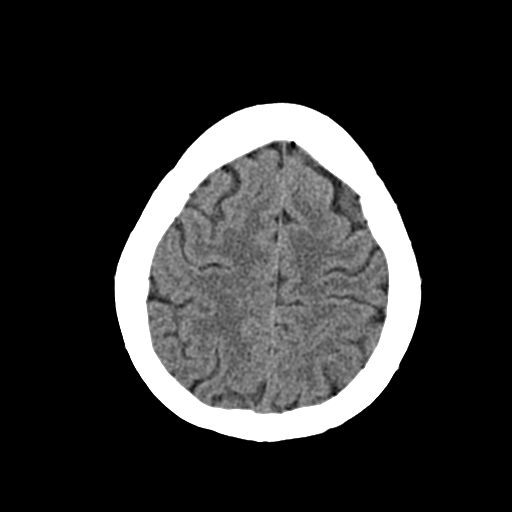
[im 24/32  bone]
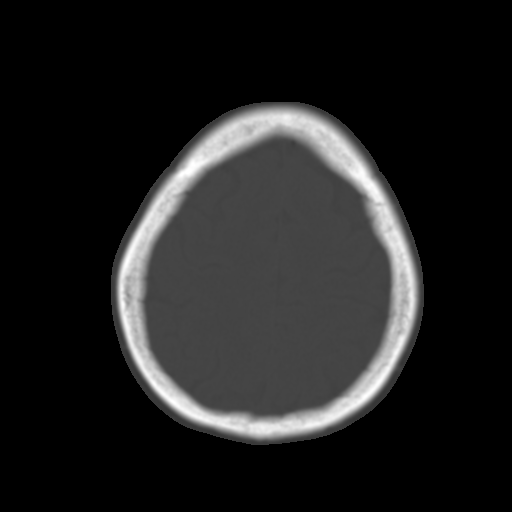
[im 26/32  brain]
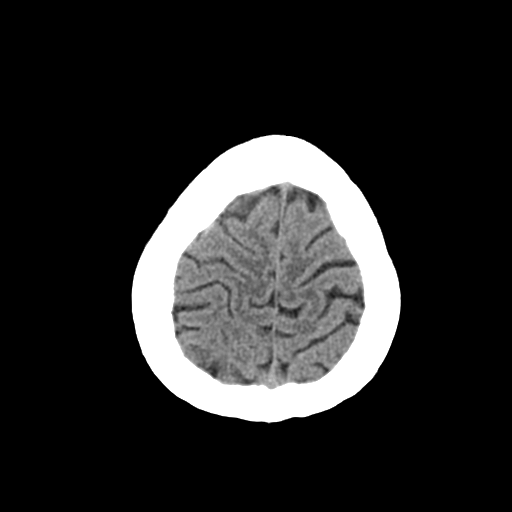
[im 28/32  brain]
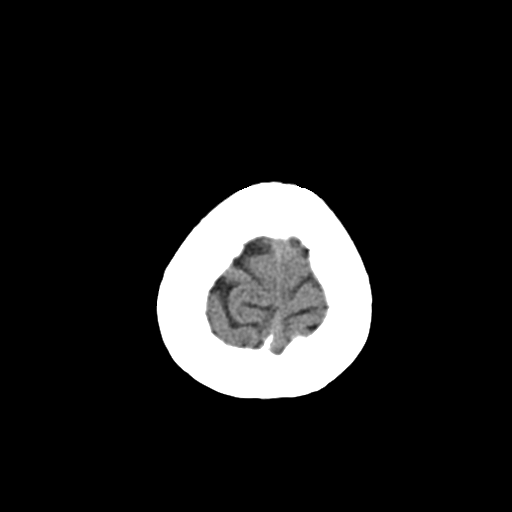
[im 30/32  brain]
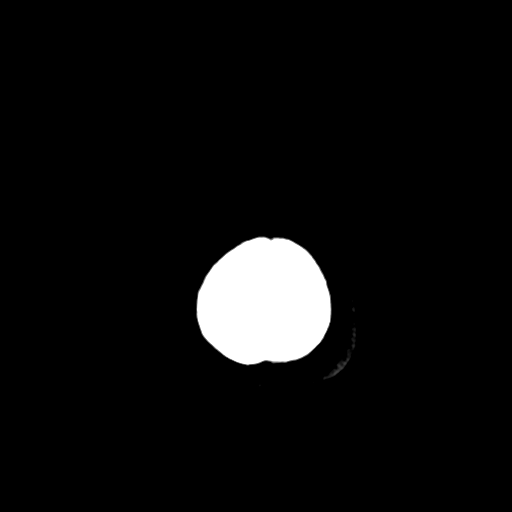

[16 of 30 positions shown; findings below may reference images not displayed]

FINDINGS: No mass lesion. No midline shift. No acute hemorrhage or hematoma.
No extra-axial fluid collections. No evidence of acute infarction.
Calvarium is intact.
IMPRESSION: Normal head CT

## 2016-03-04 ENCOUNTER — Other Ambulatory Visit: Payer: Self-pay | Admitting: Family

## 2016-03-04 MED FILL — MONTELUKAST SOD 10 MG TAB: 10 | 30 days supply | Qty: 30 | Fill #0

## 2016-03-09 DIAGNOSIS — G4733 Obstructive sleep apnea (adult) (pediatric): Secondary | ICD-10-CM | POA: Diagnosis not present

## 2016-03-18 DIAGNOSIS — G4733 Obstructive sleep apnea (adult) (pediatric): Secondary | ICD-10-CM

## 2016-03-18 HISTORY — DX: Obstructive sleep apnea (adult) (pediatric): G47.33

## 2016-03-19 ENCOUNTER — Encounter: Payer: Self-pay | Admitting: Physician Assistant

## 2016-03-19 ENCOUNTER — Ambulatory Visit (INDEPENDENT_AMBULATORY_CARE_PROVIDER_SITE_OTHER): Payer: 59 | Admitting: Physician Assistant

## 2016-03-19 ENCOUNTER — Ambulatory Visit: Payer: 59

## 2016-03-19 VITALS — BP 102/72 | HR 97 | Temp 97.7°F | Resp 16 | Ht 60.0 in | Wt 161.0 lb

## 2016-03-19 DIAGNOSIS — M5441 Lumbago with sciatica, right side: Secondary | ICD-10-CM | POA: Diagnosis not present

## 2016-03-19 DIAGNOSIS — E538 Deficiency of other specified B group vitamins: Secondary | ICD-10-CM | POA: Diagnosis not present

## 2016-03-19 MED ORDER — METHYLPREDNISOLONE 4 MG PO TBPK: ORAL_TABLET | ORAL | Status: DC

## 2016-03-19 MED ORDER — HYDROCODONE-ACETAMINOPHEN 10-325 MG PO TABS: 1.0000 | ORAL_TABLET | Freq: Three times a day (TID) | ORAL | Status: DC | PRN

## 2016-03-19 MED ORDER — CYANOCOBALAMIN 1000 MCG/ML IJ SOLN
1000.0000 ug | Freq: Once | INTRAMUSCULAR | Status: AC
  Administered 2016-03-19: 1000 ug via INTRAMUSCULAR

## 2016-03-19 MED ORDER — CYCLOBENZAPRINE HCL 10 MG PO TABS: 10.0000 mg | ORAL_TABLET | Freq: Three times a day (TID) | ORAL | Status: DC | PRN

## 2016-03-19 MED FILL — CYCLOBENZAPRINE 10 MG TAB: 10 | 10 days supply | Qty: 30 | Fill #0

## 2016-03-19 MED FILL — METHYLPREDNISOLONE 4 MG TAB: 4 | 6 days supply | Qty: 21 | Fill #0

## 2016-03-19 MED FILL — HYDROCODON-APAP 10-325: 10-325 | 6 days supply | Qty: 20 | Fill #0

## 2016-03-19 NOTE — Patient Instructions (Signed)
Please take the steroid pack as directed. Use the Flexeril up to 3 x day. Do not take and drive or operate machinery. Use Hydrocodone as directed only if needed for severe pain. Otherwise tylenol will suffice.  No heavy lifting or overexertion. Rest. Heating pad may be beneficial.  Once pain is improved, please start exercises below.  Follow-up if not resolving.  Sciatica With Rehab The sciatic nerve runs from the back down the leg and is responsible for sensation and control of the muscles in the back (posterior) side of the thigh, lower leg, and foot. Sciatica is a condition that is characterized by inflammation of this nerve.  SYMPTOMS   Signs of nerve damage, including numbness and/or weakness along the posterior side of the lower extremity.  Pain in the back of the thigh that may also travel down the leg.  Pain that worsens when sitting for long periods of time.  Occasionally, pain in the back or buttock. CAUSES  Inflammation of the sciatic nerve is the cause of sciatica. The inflammation is due to something irritating the nerve. Common sources of irritation include:  Sitting for long periods of time.  Direct trauma to the nerve.  Arthritis of the spine.  Herniated or ruptured disk.  Slipping of the vertebrae (spondylolisthesis).  Pressure from soft tissues, such as muscles or ligament-like tissue (fascia). RISK INCREASES WITH:  Sports that place pressure or stress on the spine (football or weightlifting).  Poor strength and flexibility.  Failure to warm up properly before activity.  Family history of low back pain or disk disorders.  Previous back injury or surgery.  Poor body mechanics, especially when lifting, or poor posture. PREVENTION   Warm up and stretch properly before activity.  Maintain physical fitness:  Strength, flexibility, and endurance.  Cardiovascular fitness.  Learn and use proper technique, especially with posture and lifting. When  possible, have coach correct improper technique.  Avoid activities that place stress on the spine. PROGNOSIS If treated properly, then sciatica usually resolves within 6 weeks. However, occasionally surgery is necessary.  RELATED COMPLICATIONS   Permanent nerve damage, including pain, numbness, tingle, or weakness.  Chronic back pain.  Risks of surgery: infection, bleeding, nerve damage, or damage to surrounding tissues. TREATMENT Treatment initially involves resting from any activities that aggravate your symptoms. The use of ice and medication may help reduce pain and inflammation. The use of strengthening and stretching exercises may help reduce pain with activity. These exercises may be performed at home or with referral to a therapist. A therapist may recommend further treatments, such as transcutaneous electronic nerve stimulation (TENS) or ultrasound. Your caregiver may recommend corticosteroid injections to help reduce inflammation of the sciatic nerve. If symptoms persist despite non-surgical (conservative) treatment, then surgery may be recommended. MEDICATION  If pain medication is necessary, then nonsteroidal anti-inflammatory medications, such as aspirin and ibuprofen, or other minor pain relievers, such as acetaminophen, are often recommended.  Do not take pain medication for 7 days before surgery.  Prescription pain relievers may be given if deemed necessary by your caregiver. Use only as directed and only as much as you need.  Ointments applied to the skin may be helpful.  Corticosteroid injections may be given by your caregiver. These injections should be reserved for the most serious cases, because they may only be given a certain number of times. HEAT AND COLD  Cold treatment (icing) relieves pain and reduces inflammation. Cold treatment should be applied for 10 to 15 minutes every 2  to 3 hours for inflammation and pain and immediately after any activity that aggravates  your symptoms. Use ice packs or massage the area with a piece of ice (ice massage).  Heat treatment may be used prior to performing the stretching and strengthening activities prescribed by your caregiver, physical therapist, or athletic trainer. Use a heat pack or soak the injury in warm water. SEEK MEDICAL CARE IF:  Treatment seems to offer no benefit, or the condition worsens.  Any medications produce adverse side effects. EXERCISES  RANGE OF MOTION (ROM) AND STRETCHING EXERCISES - Sciatica Most people with sciatic will find that their symptoms worsen with either excessive bending forward (flexion) or arching at the low back (extension). The exercises which will help resolve your symptoms will focus on the opposite motion. Your physician, physical therapist or athletic trainer will help you determine which exercises will be most helpful to resolve your low back pain. Do not complete any exercises without first consulting with your clinician. Discontinue any exercises which worsen your symptoms until you speak to your clinician. If you have pain, numbness or tingling which travels down into your buttocks, leg or foot, the goal of the therapy is for these symptoms to move closer to your back and eventually resolve. Occasionally, these leg symptoms will get better, but your low back pain may worsen; this is typically an indication of progress in your rehabilitation. Be certain to be very alert to any changes in your symptoms and the activities in which you participated in the 24 hours prior to the change. Sharing this information with your clinician will allow him/her to most efficiently treat your condition. These exercises may help you when beginning to rehabilitate your injury. Your symptoms may resolve with or without further involvement from your physician, physical therapist or athletic trainer. While completing these exercises, remember:   Restoring tissue flexibility helps normal motion to  return to the joints. This allows healthier, less painful movement and activity.  An effective stretch should be held for at least 30 seconds.  A stretch should never be painful. You should only feel a gentle lengthening or release in the stretched tissue. FLEXION RANGE OF MOTION AND STRETCHING EXERCISES: STRETCH - Flexion, Single Knee to Chest   Lie on a firm bed or floor with both legs extended in front of you.  Keeping one leg in contact with the floor, bring your opposite knee to your chest. Hold your leg in place by either grabbing behind your thigh or at your knee.  Pull until you feel a gentle stretch in your low back. Hold __________ seconds.  Slowly release your grasp and repeat the exercise with the opposite side. Repeat __________ times. Complete this exercise __________ times per day.  STRETCH - Flexion, Double Knee to Chest  Lie on a firm bed or floor with both legs extended in front of you.  Keeping one leg in contact with the floor, bring your opposite knee to your chest.  Tense your stomach muscles to support your back and then lift your other knee to your chest. Hold your legs in place by either grabbing behind your thighs or at your knees.  Pull both knees toward your chest until you feel a gentle stretch in your low back. Hold __________ seconds.  Tense your stomach muscles and slowly return one leg at a time to the floor. Repeat __________ times. Complete this exercise __________ times per day.  STRETCH - Low Trunk Rotation   Lie on a firm  bed or floor. Keeping your legs in front of you, bend your knees so they are both pointed toward the ceiling and your feet are flat on the floor.  Extend your arms out to the side. This will stabilize your upper body by keeping your shoulders in contact with the floor.  Gently and slowly drop both knees together to one side until you feel a gentle stretch in your low back. Hold for __________ seconds.  Tense your stomach  muscles to support your low back as you bring your knees back to the starting position. Repeat the exercise to the other side. Repeat __________ times. Complete this exercise __________ times per day  EXTENSION RANGE OF MOTION AND FLEXIBILITY EXERCISES: STRETCH - Extension, Prone on Elbows  Lie on your stomach on the floor, a bed will be too soft. Place your palms about shoulder width apart and at the height of your head.  Place your elbows under your shoulders. If this is too painful, stack pillows under your chest.  Allow your body to relax so that your hips drop lower and make contact more completely with the floor.  Hold this position for __________ seconds.  Slowly return to lying flat on the floor. Repeat __________ times. Complete this exercise __________ times per day.  RANGE OF MOTION - Extension, Prone Press Ups  Lie on your stomach on the floor, a bed will be too soft. Place your palms about shoulder width apart and at the height of your head.  Keeping your back as relaxed as possible, slowly straighten your elbows while keeping your hips on the floor. You may adjust the placement of your hands to maximize your comfort. As you gain motion, your hands will come more underneath your shoulders.  Hold this position __________ seconds.  Slowly return to lying flat on the floor. Repeat __________ times. Complete this exercise __________ times per day.  STRENGTHENING EXERCISES - Sciatica  These exercises may help you when beginning to rehabilitate your injury. These exercises should be done near your "sweet spot." This is the neutral, low-back arch, somewhere between fully rounded and fully arched, that is your least painful position. When performed in this safe range of motion, these exercises can be used for people who have either a flexion or extension based injury. These exercises may resolve your symptoms with or without further involvement from your physician, physical therapist  or athletic trainer. While completing these exercises, remember:   Muscles can gain both the endurance and the strength needed for everyday activities through controlled exercises.  Complete these exercises as instructed by your physician, physical therapist or athletic trainer. Progress with the resistance and repetition exercises only as your caregiver advises.  You may experience muscle soreness or fatigue, but the pain or discomfort you are trying to eliminate should never worsen during these exercises. If this pain does worsen, stop and make certain you are following the directions exactly. If the pain is still present after adjustments, discontinue the exercise until you can discuss the trouble with your clinician. STRENGTHENING - Deep Abdominals, Pelvic Tilt   Lie on a firm bed or floor. Keeping your legs in front of you, bend your knees so they are both pointed toward the ceiling and your feet are flat on the floor.  Tense your lower abdominal muscles to press your low back into the floor. This motion will rotate your pelvis so that your tail bone is scooping upwards rather than pointing at your feet or into the  floor.  With a gentle tension and even breathing, hold this position for __________ seconds. Repeat __________ times. Complete this exercise __________ times per day.  STRENGTHENING - Abdominals, Crunches   Lie on a firm bed or floor. Keeping your legs in front of you, bend your knees so they are both pointed toward the ceiling and your feet are flat on the floor. Cross your arms over your chest.  Slightly tip your chin down without bending your neck.  Tense your abdominals and slowly lift your trunk high enough to just clear your shoulder blades. Lifting higher can put excessive stress on the low back and does not further strengthen your abdominal muscles.  Control your return to the starting position. Repeat __________ times. Complete this exercise __________ times per day.    STRENGTHENING - Quadruped, Opposite UE/LE Lift  Assume a hands and knees position on a firm surface. Keep your hands under your shoulders and your knees under your hips. You may place padding under your knees for comfort.  Find your neutral spine and gently tense your abdominal muscles so that you can maintain this position. Your shoulders and hips should form a rectangle that is parallel with the floor and is not twisted.  Keeping your trunk steady, lift your right hand no higher than your shoulder and then your left leg no higher than your hip. Make sure you are not holding your breath. Hold this position __________ seconds.  Continuing to keep your abdominal muscles tense and your back steady, slowly return to your starting position. Repeat with the opposite arm and leg. Repeat __________ times. Complete this exercise __________ times per day.  STRENGTHENING - Abdominals and Quadriceps, Straight Leg Raise   Lie on a firm bed or floor with both legs extended in front of you.  Keeping one leg in contact with the floor, bend the other knee so that your foot can rest flat on the floor.  Find your neutral spine, and tense your abdominal muscles to maintain your spinal position throughout the exercise.  Slowly lift your straight leg off the floor about 6 inches for a count of 15, making sure to not hold your breath.  Still keeping your neutral spine, slowly lower your leg all the way to the floor. Repeat this exercise with each leg __________ times. Complete this exercise __________ times per day. POSTURE AND BODY MECHANICS CONSIDERATIONS - Sciatica Keeping correct posture when sitting, standing or completing your activities will reduce the stress put on different body tissues, allowing injured tissues a chance to heal and limiting painful experiences. The following are general guidelines for improved posture. Your physician or physical therapist will provide you with any instructions specific  to your needs. While reading these guidelines, remember:  The exercises prescribed by your provider will help you have the flexibility and strength to maintain correct postures.  The correct posture provides the optimal environment for your joints to work. All of your joints have less wear and tear when properly supported by a spine with good posture. This means you will experience a healthier, less painful body.  Correct posture must be practiced with all of your activities, especially prolonged sitting and standing. Correct posture is as important when doing repetitive low-stress activities (typing) as it is when doing a single heavy-load activity (lifting). RESTING POSITIONS Consider which positions are most painful for you when choosing a resting position. If you have pain with flexion-based activities (sitting, bending, stooping, squatting), choose a position that allows you  to rest in a less flexed posture. You would want to avoid curling into a fetal position on your side. If your pain worsens with extension-based activities (prolonged standing, working overhead), avoid resting in an extended position such as sleeping on your stomach. Most people will find more comfort when they rest with their spine in a more neutral position, neither too rounded nor too arched. Lying on a non-sagging bed on your side with a pillow between your knees, or on your back with a pillow under your knees will often provide some relief. Keep in mind, being in any one position for a prolonged period of time, no matter how correct your posture, can still lead to stiffness. PROPER SITTING POSTURE In order to minimize stress and discomfort on your spine, you must sit with correct posture Sitting with good posture should be effortless for a healthy body. Returning to good posture is a gradual process. Many people can work toward this most comfortably by using various supports until they have the flexibility and strength to  maintain this posture on their own. When sitting with proper posture, your ears will fall over your shoulders and your shoulders will fall over your hips. You should use the back of the chair to support your upper back. Your low back will be in a neutral position, just slightly arched. You may place a small pillow or folded towel at the base of your low back for support.  When working at a desk, create an environment that supports good, upright posture. Without extra support, muscles fatigue and lead to excessive strain on joints and other tissues. Keep these recommendations in mind: CHAIR:   A chair should be able to slide under your desk when your back makes contact with the back of the chair. This allows you to work closely.  The chair's height should allow your eyes to be level with the upper part of your monitor and your hands to be slightly lower than your elbows. BODY POSITION  Your feet should make contact with the floor. If this is not possible, use a foot rest.  Keep your ears over your shoulders. This will reduce stress on your neck and low back. INCORRECT SITTING POSTURES   If you are feeling tired and unable to assume a healthy sitting posture, do not slouch or slump. This puts excessive strain on your back tissues, causing more damage and pain. Healthier options include:  Using more support, like a lumbar pillow.  Switching tasks to something that requires you to be upright or walking.  Talking a brief walk.  Lying down to rest in a neutral-spine position. PROLONGED STANDING WHILE SLIGHTLY LEANING FORWARD  When completing a task that requires you to lean forward while standing in one place for a long time, place either foot up on a stationary 2-4 inch high object to help maintain the best posture. When both feet are on the ground, the low back tends to lose its slight inward curve. If this curve flattens (or becomes too large), then the back and your other joints will  experience too much stress, fatigue more quickly and can cause pain.  CORRECT STANDING POSTURES Proper standing posture should be assumed with all daily activities, even if they only take a few moments, like when brushing your teeth. As in sitting, your ears should fall over your shoulders and your shoulders should fall over your hips. You should keep a slight tension in your abdominal muscles to brace your spine. Your tailbone  should point down to the ground, not behind your body, resulting in an over-extended swayback posture.  INCORRECT STANDING POSTURES  Common incorrect standing postures include a forward head, locked knees and/or an excessive swayback. WALKING Walk with an upright posture. Your ears, shoulders and hips should all line-up. PROLONGED ACTIVITY IN A FLEXED POSITION When completing a task that requires you to bend forward at your waist or lean over a low surface, try to find a way to stabilize 3 of 4 of your limbs. You can place a hand or elbow on your thigh or rest a knee on the surface you are reaching across. This will provide you more stability so that your muscles do not fatigue as quickly. By keeping your knees relaxed, or slightly bent, you will also reduce stress across your low back. CORRECT LIFTING TECHNIQUES DO :   Assume a wide stance. This will provide you more stability and the opportunity to get as close as possible to the object which you are lifting.  Tense your abdominals to brace your spine; then bend at the knees and hips. Keeping your back locked in a neutral-spine position, lift using your leg muscles. Lift with your legs, keeping your back straight.  Test the weight of unknown objects before attempting to lift them.  Try to keep your elbows locked down at your sides in order get the best strength from your shoulders when carrying an object.  Always ask for help when lifting heavy or awkward objects. INCORRECT LIFTING TECHNIQUES DO NOT:   Lock your  knees when lifting, even if it is a small object.  Bend and twist. Pivot at your feet or move your feet when needing to change directions.  Assume that you cannot safely pick up a paperclip without proper posture.   This information is not intended to replace advice given to you by your health care provider. Make sure you discuss any questions you have with your health care provider.   Document Released: 10/04/2005 Document Revised: 02/18/2015 Document Reviewed: 01/16/2009 [employer] Interactive Patient Education [employer].

## 2016-03-19 NOTE — Progress Notes (Signed)
Patient presents to clinic today c/o 1 day of R sided low back pain and hip pain, extending into upper leg. Endorses symptoms began after lifting a heavy box at work yesterday. Endorses with lifting she noted sharp pain in back. Symptoms worsened since onset. Denies saddle anesthesia or change to bowel/bladder habits.  Past Medical History  Diagnosis Date  . Asthma   . History of chicken pox   . Depression   . Allergy   . GERD (gastroesophageal reflux disease)   . Migraines   . B12 deficiency 05/14/2015    Current Outpatient Prescriptions on File Prior to Visit  Medication Sig Dispense Refill  . albuterol (PROAIR HFA) 108 (90 BASE) MCG/ACT inhaler Inhale 2 puffs into the lungs every 6 (six) hours as needed for wheezing or shortness of breath. 3.7 g 3  . betamethasone valerate ointment (VALISONE) 0.1 % Apply small amount to ear as needed for itching/scaling.    Marland Kitchen buPROPion (WELLBUTRIN XL) 300 MG 24 hr tablet TAKE 1 TABLET BY MOUTH DAILY. 90 tablet 1  . cetirizine (ZYRTEC) 10 MG tablet Take 10 mg by mouth daily.    . CYANOCOBALAMIN IJ Inject 1,000 mcg as directed every 30 (thirty) days.    . Ergocalciferol (VITAMIN D2) 2000 UNITS TABS Take 1 tablet by mouth daily.    Marland Kitchen esomeprazole (NEXIUM) 40 MG capsule TAKE 1 CAPSULE BY MOUTH DAILY AT 12 NOON. 90 capsule 1  . montelukast (SINGULAIR) 10 MG tablet Take 1 tablet (10 mg total) by mouth at bedtime. 30 tablet 0  . topiramate (TOPAMAX) 50 MG tablet Take 50 mg by mouth 2 (two) times daily.    Marland Kitchen venlafaxine XR (EFFEXOR XR) 37.5 MG 24 hr capsule 2 tabs PO daily 60 capsule 5   No current facility-administered medications on file prior to visit.    No Known Allergies  Family History  Problem Relation Age of Onset  . Hyperlipidemia Mother   . Heart disease Mother   . Hypertension Mother   . Depression Mother   . Parkinson's disease Father   . Depression Maternal Aunt   . Alcohol abuse Maternal Uncle   . Depression Maternal Grandmother    . Heart disease Maternal Grandfather   . Hypertension Maternal Grandfather   . Alcohol abuse Maternal Grandfather   . AVM Maternal Aunt     congenital  . Aneurysm Maternal Aunt     Social History   Social History  . Marital Status: Married    Spouse Name: N/A  . Number of Children: N/A  . Years of Education: N/A   Social History Main Topics  . Smoking status: Never Smoker   . Smokeless tobacco: None  . Alcohol Use: None  . Drug Use: None  . Sexual Activity: Not Asked   Other Topics Concern  . 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    Has associates degree   Enjoys painting, animals, antiquing, shopping, reading, movies      Review of Systems - See HPI.  All other ROS are negative.  BP 102/72 mmHg  Pulse 97  Temp(Src) 97.7 F (36.5 C) (Oral)  Resp 16  Ht 5' (1.524 m)  Wt 161 lb (73.029 kg)  BMI 31.44 kg/m2  SpO2 99%  LMP 03/23/2014  Physical Exam  Constitutional: She is oriented to person, place, and time and well-developed, well-nourished, and in no distress.  HENT:  Head: Normocephalic and atraumatic.  Eyes: Conjunctivae are normal.  Cardiovascular: Normal rate, regular rhythm, normal heart sounds and intact distal pulses.   Pulmonary/Chest: Effort normal and breath sounds normal. No respiratory distress. She has no wheezes.  Musculoskeletal:       Right hip: Normal.       Thoracic back: Normal.       Lumbar back: She exhibits tenderness, pain and spasm. She exhibits no bony tenderness.  Neurological: She is alert and oriented to person, place, and time.  Skin: Skin is warm and dry. No rash noted.  Vitals reviewed.  Assessment/Plan: 1. Right-sided low back pain with right-sided sciatica Will begin steroid pack and Flexeril. Norco for severe pain only. Supportive measures and stretching exercises reviewed.  - cyclobenzaprine (FLEXERIL) 10 MG tablet; Take 1 tablet (10 mg total)  by mouth 3 (three) times daily as needed for muscle spasms.  Dispense: 30 tablet; Refill: 0 - methylPREDNISolone (MEDROL DOSEPAK) 4 MG TBPK tablet; Take following package directions  Dispense: 21 tablet; Refill: 0 - HYDROcodone-acetaminophen (NORCO) 10-325 MG tablet; Take 1 tablet by mouth every 8 (eight) hours as needed.  Dispense: 20 tablet; Refill: 0   Leeanne Rio, [place]

## 2016-03-19 NOTE — Addendum Note (Signed)
Addended by: Rockwell Germany on: 03/19/2016 12:27 PM   Modules accepted: Orders

## 2016-03-22 ENCOUNTER — Other Ambulatory Visit: Payer: Self-pay | Admitting: Family

## 2016-03-22 ENCOUNTER — Ambulatory Visit (INDEPENDENT_AMBULATORY_CARE_PROVIDER_SITE_OTHER): Payer: 59 | Admitting: Family

## 2016-03-22 ENCOUNTER — Encounter: Payer: Self-pay | Admitting: Family

## 2016-03-22 VITALS — BP 133/81 | HR 111 | Temp 98.6°F | Resp 20 | Ht 60.0 in | Wt 161.8 lb

## 2016-03-22 DIAGNOSIS — G4710 Hypersomnia, unspecified: Secondary | ICD-10-CM | POA: Diagnosis not present

## 2016-03-22 DIAGNOSIS — F419 Anxiety disorder, unspecified: Secondary | ICD-10-CM

## 2016-03-22 DIAGNOSIS — F418 Other specified anxiety disorders: Secondary | ICD-10-CM

## 2016-03-22 DIAGNOSIS — G43909 Migraine, unspecified, not intractable, without status migrainosus: Secondary | ICD-10-CM | POA: Diagnosis not present

## 2016-03-22 DIAGNOSIS — G43809 Other migraine, not intractable, without status migrainosus: Secondary | ICD-10-CM

## 2016-03-22 DIAGNOSIS — F329 Major depressive disorder, single episode, unspecified: Secondary | ICD-10-CM

## 2016-03-22 MED ORDER — TOPIRAMATE 50 MG PO TABS: 50.0000 mg | ORAL_TABLET | Freq: Two times a day (BID) | ORAL | Status: DC

## 2016-03-22 MED ORDER — VENLAFAXINE HCL ER 37.5 MG PO CP24: ORAL_CAPSULE | ORAL | Status: DC

## 2016-03-22 MED ORDER — MONTELUKAST SODIUM 10 MG PO TABS: 10.0000 mg | ORAL_TABLET | Freq: Every day | ORAL | Status: DC

## 2016-03-22 MED FILL — ESOMEPRAZOLE MAG DR 40 MG C: 40 | 90 days supply | Qty: 90 | Fill #0

## 2016-03-22 MED FILL — BUPROPION HCL XL 300 MG TAB: 300 | 90 days supply | Qty: 90 | Fill #0

## 2016-03-22 NOTE — Progress Notes (Signed)
Subjective:    Patient ID: Terri Johnson, female    DOB: Mar 31, 1966, 50 y.o.   MRN: [id number]  HPI  [name] is a 50 yr old female who presents today for follow up.  1) Anxiety/depression- maintained on wellbutrin and effexor. Has follow up tomorrow with sleep specialist tomorrow. Well controlled on medication.   2) Migraines- was seeing Terri Johnson at [facility] neurology.  She has not seen in some time.  Reports that he has frequent Ha's.    Review of Systems See HPI  Past Medical History  Diagnosis Date  . Asthma   . History of chicken pox   . Depression   . Allergy   . GERD (gastroesophageal reflux disease)   . Migraines   . B12 deficiency 05/14/2015     Social History   Social History  . Marital Status: Married    Spouse Name: N/A  . Number of Children: N/A  . Years of Education: N/A   Occupational History  . Not on file.   Social History Main Topics  . Smoking status: Never Smoker   . Smokeless tobacco: Not on file  . Alcohol Use: Not on file  . Drug Use: Not on file  . Sexual Activity: Not on file   Other Topics Concern  . 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    Has associates degree   Enjoys painting, animals, antiquing, shopping, reading, movies       Past Surgical History  Procedure Laterality Date  . Myringotomy Bilateral     twice each.  Terri Johnson repair - left Left   . Cholecystectomy  2007  . Tonsillectomy and adenoidectomy  1976  . Inner ear surgery      Pt states she had her "eardrum rebuilt". Has had 8 sets of tubes in ears through adolecense  . Tubal ligation  2001  . Cesarean section  1991    Family History  Problem Relation Age of Onset  . Hyperlipidemia Mother   . Heart disease Mother   . Hypertension Mother   . Depression Mother   . Parkinson's disease Father   . Depression Maternal Aunt   . Alcohol abuse Maternal Uncle   . Depression  Maternal Grandmother   . Heart disease Maternal Grandfather   . Hypertension Maternal Grandfather   . Alcohol abuse Maternal Grandfather   . AVM Maternal Aunt     congenital  . Aneurysm Maternal Aunt     No Known Allergies  Current Outpatient Prescriptions on File Prior to Visit  Medication Sig Dispense Refill  . albuterol (PROAIR HFA) 108 (90 BASE) MCG/ACT inhaler Inhale 2 puffs into the lungs every 6 (six) hours as needed for wheezing or shortness of breath. 3.7 g 3  . betamethasone valerate ointment (VALISONE) 0.1 % Apply small amount to ear as needed for itching/scaling.    Marland Kitchen buPROPion (WELLBUTRIN XL) 300 MG 24 hr tablet TAKE 1 TABLET BY MOUTH DAILY. 90 tablet 0  . cetirizine (ZYRTEC) 10 MG tablet Take 10 mg by mouth daily.    . CYANOCOBALAMIN IJ Inject 1,000 mcg as directed every 30 (thirty) days.    . cyclobenzaprine (FLEXERIL) 10 MG tablet Take 1 tablet (10 mg total) by mouth 3 (three) times daily as needed for muscle spasms. 30 tablet 0  . esomeprazole (NEXIUM) 40 MG capsule TAKE 1 CAPSULE BY MOUTH  DAILY AT 12 NOON. 90 capsule 0  . HYDROcodone-acetaminophen (NORCO) 10-325 MG tablet Take 1 tablet by mouth every 8 (eight) hours as needed. 20 tablet 0  . methylPREDNISolone (MEDROL DOSEPAK) 4 MG TBPK tablet Take following package directions 21 tablet 0  . montelukast (SINGULAIR) 10 MG tablet Take 1 tablet (10 mg total) by mouth at bedtime. 30 tablet 0  . topiramate (TOPAMAX) 50 MG tablet Take 50 mg by mouth 2 (two) times daily.    Marland Kitchen venlafaxine XR (EFFEXOR XR) 37.5 MG 24 hr capsule 2 tabs PO daily 60 capsule 5  . Ergocalciferol (VITAMIN D2) 2000 UNITS TABS Take 1 tablet by mouth daily. Reported on 03/22/2016     No current facility-administered medications on file prior to visit.    BP 133/81 mmHg  Pulse 111  Temp(Src) 98.6 F (37 C) (Oral)  Resp 20  Ht 5' (1.524 m)  Wt 161 lb 12.8 oz (73.392 kg)  BMI 31.60 kg/m2  SpO2 100%  LMP 03/23/2014       Objective:   Physical  Exam  Constitutional: She is oriented to person, place, and time. She appears well-developed and well-nourished.  Cardiovascular: Normal rate, regular rhythm and normal heart sounds.   No murmur heard. Pulmonary/Chest: Effort normal and breath sounds normal. No respiratory distress. She has no wheezes.  Musculoskeletal: She exhibits no edema.  Neurological: She is alert and oriented to person, place, and time.  Psychiatric: She has a normal mood and affect. Her behavior is normal. Judgment and thought content normal.          Assessment & Plan:

## 2016-03-22 NOTE — Telephone Encounter (Signed)
Due 05/16/16 for follow up please.

## 2016-03-22 NOTE — Progress Notes (Signed)
Pre visit review using our clinic review tool, if applicable. No additional management support is needed unless otherwise documented below in the visit note. 

## 2016-03-22 NOTE — Telephone Encounter (Signed)
90 Day supply of bupropion and nexium sent to pharmacy. Pt last seen by PCP 1/301/7 and has no future appts scheduled. When should pt follow up in the office?

## 2016-03-22 NOTE — Patient Instructions (Addendum)
Please complete lab work tomorrow at [facility]. Follow up after 7/18 for complete physical.  You will be contacted about your referral to neurology.

## 2016-03-23 ENCOUNTER — Ambulatory Visit (INDEPENDENT_AMBULATORY_CARE_PROVIDER_SITE_OTHER): Payer: 59 | Admitting: Pulmonary Disease

## 2016-03-23 ENCOUNTER — Encounter: Payer: Self-pay | Admitting: Pulmonary Disease

## 2016-03-23 ENCOUNTER — Telehealth: Payer: Self-pay | Admitting: Pulmonary Disease

## 2016-03-23 ENCOUNTER — Other Ambulatory Visit (INDEPENDENT_AMBULATORY_CARE_PROVIDER_SITE_OTHER): Payer: 59

## 2016-03-23 VITALS — BP 128/78 | HR 114 | Ht 60.0 in | Wt 162.0 lb

## 2016-03-23 DIAGNOSIS — J4520 Mild intermittent asthma, uncomplicated: Secondary | ICD-10-CM | POA: Diagnosis not present

## 2016-03-23 DIAGNOSIS — G43909 Migraine, unspecified, not intractable, without status migrainosus: Secondary | ICD-10-CM | POA: Diagnosis not present

## 2016-03-23 DIAGNOSIS — G4733 Obstructive sleep apnea (adult) (pediatric): Secondary | ICD-10-CM | POA: Diagnosis not present

## 2016-03-23 LAB — BASIC METABOLIC PANEL
BUN: 17 mg/dL (ref 6–23)
CALCIUM: 9.2 mg/dL (ref 8.4–10.5)
CHLORIDE: 107 meq/L (ref 96–112)
CO2: 25 mEq/L (ref 19–32)
CREATININE: 0.8 mg/dL (ref 0.40–1.20)
GFR: 80.8 mL/min (ref >60.00)
Glucose, Bld: 86 mg/dL (ref 70–99)
Potassium: 4 mEq/L (ref 3.5–5.1)
Sodium: 138 mEq/L (ref 135–145)

## 2016-03-23 MED FILL — TOPIRAMATE 50 MG TABLET: 50 | 90 days supply | Qty: 180 | Fill #0

## 2016-03-23 MED FILL — VENLAFAXINE HCL ER 37.5 MG: 37.5 | 90 days supply | Qty: 180 | Fill #0

## 2016-03-23 NOTE — Patient Instructions (Signed)
We extensively discussed the diagnosis, pathophysiology, and treatment options for Obstructive Sleep Apnea (OSA).  We discussed treatment options for OSA including CPAP, BiPaP, as well as surgical options and oral devices.   We will start patient on autocpap 5-15 cm H2O.   Patient was instructed to call the office if he/she has not received the PAP device in 1-2 weeks.  Patient was instructed to have mask, tubings, filter, reservoir cleaned at least once a week with soapy water.  Patient was instructed to call the office if he/she is having issues with the PAP device.    I advised patient to obtain sufficient amount of sleep --  7 to 8 hours at least in a 24 hr period.  Patient was advised to follow good sleep hygiene.  Patient was advised NOT to engage in activities requiring concentration and/or vigilance if he/she is and  sleepy.  Patient is NOT to drive if he/she is sleepy.    Return to clinic in 2-3 mos.

## 2016-03-23 NOTE — Assessment & Plan Note (Signed)
Not currently following with neurology. Requests refill of topamax. I will send refill but advised pt that I would like her to establish with another neurologist- she wishes to stay in [place]. Will arrange. Obtain follow up bmet to assess Cr.

## 2016-03-23 NOTE — Assessment & Plan Note (Signed)
Pt has upcoming apt with sleep specialist.

## 2016-03-23 NOTE — Progress Notes (Signed)
Subjective:    Patient ID: Terri Johnson, female    DOB: 09-11-1966, 50 y.o.   MRN: [id number]  HPI   This is the case of Terri Johnson, 50 y.o. Female, who was referred by Debbrah Alar in consultation regarding hypersomnia.   As you very well know, patient Is here for hypersomnia. Nonsmoker. She has had asthma since she was a child. Stable. No issues with it.  Patient has been sleepy/hypersomnia for the past 2 years. Hypersomnia affects her functionality. She sleeps 7 hours per night. She wakes up unrefreshed. She snores, gasps, chokes, has witnessed apneas. 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 She struggles driving back home.  She tosses and turns. No other abnormal behavior and sleep.  ROV (03/23/2016)  Pt returns to office as f/u after her HST.  Had HST which showed AHI 6-7.  She continues to have hypersomnia and she is struggling everyday at work to stay awake.  Denies depression or anxiety. Denies taking sedating meds.       Review of Systems  Constitutional: Negative.  Negative for fever and unexpected weight change.  HENT: Positive for congestion. Negative for dental problem, ear pain, nosebleeds, postnasal drip, rhinorrhea, sinus pressure, sneezing, sore throat and trouble swallowing.   Eyes: Negative for redness and itching.  Respiratory: Positive for shortness of breath. Negative for cough, chest tightness and wheezing.   Cardiovascular: Negative.  Negative for palpitations and leg swelling.  Gastrointestinal: Negative.  Negative for nausea and vomiting.  Endocrine: Negative.   Genitourinary: Negative.  Negative for dysuria.  Musculoskeletal: Negative.  Negative for joint swelling.  Skin: Negative.  Negative for rash.  Allergic/Immunologic: Positive for environmental allergies.  Neurological: Positive for headaches.  Hematological: Negative.   Does not bruise/bleed easily.  Psychiatric/Behavioral: Negative.  Negative for dysphoric mood. The patient is not nervous/anxious.         Objective:   Physical Exam  Vitals:  Filed Vitals:   03/23/16 1535  BP: 128/78  Pulse: 114  Height: 5' (1.524 m)  Weight: 162 lb (73.483 kg)  SpO2: 98%    Constitutional/General:  Pleasant, well-nourished, well-developed, not in any distress,  Comfortably seating.  Well kempt  Body mass index is 31.64 kg/(m^2). Wt Readings from Last 3 Encounters:  03/23/16 162 lb (73.483 kg)  03/22/16 161 lb 12.8 oz (73.392 kg)  03/19/16 161 lb (73.029 kg)      HEENT: Pupils equal and reactive to light and accommodation. Anicteric sclerae. Normal nasal mucosa.   No oral  lesions,  mouth clear,  oropharynx clear, no postnasal drip. (-) Oral thrush. No dental caries.  Airway - Mallampati class III  Neck: No masses. Midline trachea. No JVD, (-) LAD. (-) bruits appreciated.  Respiratory/Chest: Grossly normal chest. (-) deformity. (-) Accessory muscle use.  Symmetric expansion. (-) Tenderness on palpation.  Resonant on percussion.  Diminished BS on both lower lung zones. (-) wheezing, crackles, rhonchi (-) egophony  Cardiovascular: Regular rate and  rhythm, heart sounds normal, no murmur or gallops, no peripheral edema  Gastrointestinal:  Normal bowel sounds. Soft, non-tender. No hepatosplenomegaly.  (-) masses.   Musculoskeletal:  Normal muscle tone. Normal gait.   Extremities: Grossly normal. (-) clubbing, cyanosis.  (-) edema  Skin: (-) rash,lesions seen.   Neurological/Psychiatric : alert, oriented to time, place, person. Normal mood and affect  Assessment & Plan:  Asthma Asthma has been stable. Childhood asthma. Alb prn.     OSA (obstructive sleep apnea) Patient has been sleepy/hypersomnia for the past 2 years. Hypersomnia affects her functionality. She sleeps 7 hours per night. She wakes up unrefreshed. She  snores, gasps, chokes, has witnessed apneas. 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 She struggles driving back home.  She tosses and turns. No other abnormal behavior and sleep.  Home sleep study in 08/2015-- AHI 4.8.  Pt with repeat HST in 03/09/2016 -- AHI 6  Plan: 1. We will order auto CPAP, 5-15 cm water. She will need a mask fitting session. 2. Told pt to give [place] a call if she does not get her cpap in 1-2 weeks OR if she has persistent hypersomnia despite cpap.  IH needs to be ruled out if with persistent hypersomnia. Might also need to check alcohol level and UDS if hypersomnia is not better with cpap. Need to check her meds as well.      Patient will follow up with me in 2-3 mos, sooner if she is not better with cpap.     Monica Becton, MD 03/24/2016   4:10 AM Pulmonary and [place] Pager: [phone] Office: [phone], Fax: [phone]

## 2016-03-23 NOTE — Assessment & Plan Note (Signed)
Stable on current meds.  Continue same. 

## 2016-03-23 NOTE — Telephone Encounter (Signed)
    Please call the pt and tell the pt the [place]  showed OSA.  Pt stops breathing  6  times an hour.   Please order autoCPAP 5-15 cm H2O. Patient will need a mask fitting session. Patient will need a 1 month download.   Patient needs to be seen by me or any of the NPs/APPs  4-6 weeks after obtaining the cpap machine. Let me know if you receive this.   Thanks!   J. Shirl Harris, MD 03/23/2016, 2:37 PM

## 2016-03-23 NOTE — Telephone Encounter (Signed)
Pt seen in office today. Results reviewed.

## 2016-03-24 DIAGNOSIS — G4733 Obstructive sleep apnea (adult) (pediatric): Secondary | ICD-10-CM | POA: Insufficient documentation

## 2016-03-24 NOTE — Assessment & Plan Note (Addendum)
Patient has been sleepy/hypersomnia for the past 2 years. Hypersomnia affects her functionality. She sleeps 7 hours per night. She wakes up unrefreshed. She snores, gasps, chokes, has witnessed apneas. 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 She struggles driving back home.  She tosses and turns. No other abnormal behavior and sleep.  Home sleep study in 08/2015-- AHI 4.8.  Pt with repeat HST in 03/09/2016 -- AHI 6  Plan: 1. We will order auto CPAP, 5-15 cm water. She will need a mask fitting session. 2. Told pt to give [place] a call if she does not get her cpap in 1-2 weeks OR if she has persistent hypersomnia despite cpap.  IH needs to be ruled out if with persistent hypersomnia. Might also need to check alcohol level and UDS if hypersomnia is not better with cpap. Need to check her meds as well.

## 2016-03-24 NOTE — Assessment & Plan Note (Signed)
Asthma has been stable. Childhood asthma. Alb prn.

## 2016-03-25 ENCOUNTER — Other Ambulatory Visit: Payer: Self-pay | Admitting: *Deleted

## 2016-03-25 DIAGNOSIS — G4710 Hypersomnia, unspecified: Secondary | ICD-10-CM

## 2016-04-02 MED FILL — MONTELUKAST SOD 10 MG TAB: 10 | 90 days supply | Qty: 90 | Fill #0

## 2016-04-05 DIAGNOSIS — G4733 Obstructive sleep apnea (adult) (pediatric): Secondary | ICD-10-CM | POA: Diagnosis not present

## 2016-04-05 DIAGNOSIS — J45901 Unspecified asthma with (acute) exacerbation: Secondary | ICD-10-CM | POA: Diagnosis not present

## 2016-04-22 ENCOUNTER — Ambulatory Visit (INDEPENDENT_AMBULATORY_CARE_PROVIDER_SITE_OTHER): Payer: 59

## 2016-04-22 DIAGNOSIS — E538 Deficiency of other specified B group vitamins: Secondary | ICD-10-CM

## 2016-04-22 MED ORDER — CYANOCOBALAMIN 1000 MCG/ML IJ SOLN
1000.0000 ug | Freq: Once | INTRAMUSCULAR | Status: AC
  Administered 2016-04-22: 1000 ug via INTRAMUSCULAR

## 2016-04-22 NOTE — Progress Notes (Signed)
Pre visit review using our clinic tool,if applicable. No additional management support is needed unless otherwise documented below in the visit note.   Patient in for B12 injection. Given IM Left deltoid. Patient tolerated well. Return appointment given for 1 month.

## 2016-05-05 DIAGNOSIS — J45901 Unspecified asthma with (acute) exacerbation: Secondary | ICD-10-CM | POA: Diagnosis not present

## 2016-05-05 DIAGNOSIS — G4733 Obstructive sleep apnea (adult) (pediatric): Secondary | ICD-10-CM | POA: Diagnosis not present

## 2016-05-06 ENCOUNTER — Encounter: Payer: Self-pay | Admitting: Family

## 2016-05-06 ENCOUNTER — Ambulatory Visit (INDEPENDENT_AMBULATORY_CARE_PROVIDER_SITE_OTHER): Payer: 59 | Admitting: Family

## 2016-05-06 VITALS — BP 132/78 | HR 72 | Temp 98.1°F | Resp 16 | Ht 60.0 in | Wt 157.6 lb

## 2016-05-06 DIAGNOSIS — G4733 Obstructive sleep apnea (adult) (pediatric): Secondary | ICD-10-CM | POA: Diagnosis not present

## 2016-05-06 DIAGNOSIS — N3281 Overactive bladder: Secondary | ICD-10-CM

## 2016-05-06 DIAGNOSIS — G43809 Other migraine, not intractable, without status migrainosus: Secondary | ICD-10-CM

## 2016-05-06 DIAGNOSIS — F418 Other specified anxiety disorders: Secondary | ICD-10-CM | POA: Diagnosis not present

## 2016-05-06 DIAGNOSIS — F329 Major depressive disorder, single episode, unspecified: Secondary | ICD-10-CM

## 2016-05-06 DIAGNOSIS — Z0001 Encounter for general adult medical examination with abnormal findings: Secondary | ICD-10-CM

## 2016-05-06 DIAGNOSIS — F419 Anxiety disorder, unspecified: Secondary | ICD-10-CM

## 2016-05-06 DIAGNOSIS — R350 Frequency of micturition: Secondary | ICD-10-CM

## 2016-05-06 DIAGNOSIS — F32A Depression, unspecified: Secondary | ICD-10-CM

## 2016-05-06 DIAGNOSIS — Z0000 Encounter for general adult medical examination without abnormal findings: Secondary | ICD-10-CM

## 2016-05-06 LAB — URINALYSIS, ROUTINE W REFLEX MICROSCOPIC
Bilirubin Urine: NEGATIVE
Hgb urine dipstick: NEGATIVE
Ketones, ur: NEGATIVE
Leukocytes, UA: NEGATIVE
Nitrite: NEGATIVE
PH: 6.5 (ref 5.0–8.0)
RBC / HPF: NONE SEEN (ref 0–?)
Specific Gravity, Urine: <=1.005 — AB (ref 1.000–1.030)
TOTAL PROTEIN, URINE-UPE24: NEGATIVE
Urine Glucose: NEGATIVE
Urobilinogen, UA: 0.2 (ref 0.0–1.0)
WBC, UA: NONE SEEN (ref 0–?)

## 2016-05-06 LAB — LIPID PANEL
CHOL/HDL RATIO: 4
Cholesterol: 199 mg/dL (ref 0–200)
HDL: 54.7 mg/dL (ref >39.00)
LDL CALC: 129 mg/dL — AB (ref 0–99)
NONHDL: 143.87
Triglycerides: 73 mg/dL (ref 0.0–149.0)
VLDL: 14.6 mg/dL (ref 0.0–40.0)

## 2016-05-06 LAB — HEPATIC FUNCTION PANEL
ALK PHOS: 89 U/L (ref 39–117)
ALT: 20 U/L (ref 0–35)
AST: 19 U/L (ref 0–37)
Albumin: 4.4 g/dL (ref 3.5–5.2)
BILIRUBIN TOTAL: 0.2 mg/dL (ref 0.2–1.2)
Bilirubin, Direct: 0.1 mg/dL (ref 0.0–0.3)
Total Protein: 7.4 g/dL (ref 6.0–8.3)

## 2016-05-06 LAB — CBC WITH DIFFERENTIAL/PLATELET
BASOS ABS: 0 10*3/uL (ref 0.0–0.1)
Basophils Relative: 0.5 % (ref 0.0–3.0)
Eosinophils Absolute: 0.1 10*3/uL (ref 0.0–0.7)
Eosinophils Relative: 2.1 % (ref 0.0–5.0)
HCT: 33.7 % — ABNORMAL LOW (ref 36.0–46.0)
Hemoglobin: 11.1 g/dL — ABNORMAL LOW (ref 12.0–15.0)
LYMPHS ABS: 2.3 10*3/uL (ref 0.7–4.0)
Lymphocytes Relative: 35.5 % (ref 12.0–46.0)
MCHC: 32.8 g/dL (ref 30.0–36.0)
MCV: 88.9 fl (ref 78.0–100.0)
MONO ABS: 0.4 10*3/uL (ref 0.1–1.0)
MONOS PCT: 6.3 % (ref 3.0–12.0)
NEUTROS PCT: 55.6 % (ref 43.0–77.0)
Neutro Abs: 3.6 10*3/uL (ref 1.4–7.7)
Platelets: 287 10*3/uL (ref 150.0–400.0)
RBC: 3.79 Mil/uL — AB (ref 3.87–5.11)
RDW: 16.6 % — ABNORMAL HIGH (ref 11.5–15.5)
WBC: 6.4 10*3/uL (ref 4.0–10.5)

## 2016-05-06 LAB — TSH: TSH: 1.7 u[IU]/mL (ref 0.35–4.50)

## 2016-05-06 LAB — BASIC METABOLIC PANEL
BUN: 15 mg/dL (ref 6–23)
CALCIUM: 9.6 mg/dL (ref 8.4–10.5)
CO2: 23 mEq/L (ref 19–32)
Chloride: 108 mEq/L (ref 96–112)
Creatinine, Ser: 0.91 mg/dL (ref 0.40–1.20)
GFR: 69.6 mL/min (ref >60.00)
GLUCOSE: 73 mg/dL (ref 70–99)
Potassium: 3.8 mEq/L (ref 3.5–5.1)
SODIUM: 139 meq/L (ref 135–145)

## 2016-05-06 MED ORDER — MIRABEGRON ER 25 MG PO TB24: 25.0000 mg | ORAL_TABLET | Freq: Every day | ORAL | Status: DC

## 2016-05-06 MED FILL — MYRBETRIQ ER 25 MG TABLET: 25 | 30 days supply | Qty: 30 | Fill #0

## 2016-05-06 NOTE — Progress Notes (Signed)
Pre visit review using our clinic review tool, if applicable. No additional management support is needed unless otherwise documented below in the visit note. 

## 2016-05-06 NOTE — Progress Notes (Addendum)
Subjective:    Patient ID: Terri Johnson, female    DOB: 1966/10/01, 50 y.o.   MRN: [id number]  HPI  Ms.  Roan is a 50 yr old female who presents today for cpx.  Patient presents today for complete physical.  Immunizations: tdap up to date Diet: healthy- has lost 5 pounds, watching diet/calorie counting Exercise: walks, cardio, 3 days a week.  Colonoscopy:  due Pap Smear: 06/24/15 normal Mammogram: 05/15/15 Dental: up to date Vision: up to date  Wt Readings from Last 3 Encounters:  05/06/16 157 lb 9.6 oz (71.487 kg)  03/23/16 162 lb (73.483 kg)  03/22/16 161 lb 12.8 oz (73.392 kg)   OSA- Reports CPAP is helping "a lot."  Less fatigued.  Migraines- No migraines/HA's since she started CPAP. Wants to taper off of topamax.  Depression- on wellbutrin and effexor.  Reports mood is good. Wants to continue wellbutrin "because I think it helps me focus." Would like to try to wean off of effexor.   Review of Systems  Constitutional: Negative for unexpected weight change.  HENT: Negative for hearing loss and rhinorrhea.   Eyes: Negative for visual disturbance.  Respiratory: Negative for cough.   Cardiovascular: Negative for leg swelling.  Gastrointestinal: Negative for diarrhea and constipation.  Genitourinary: Negative for dysuria.       Urinary frequency/urgency and some stess incontinence. Denies dysuria  Musculoskeletal: Negative for myalgias and arthralgias.  Skin: Negative for rash.  Neurological: Negative for headaches.  Hematological: Negative for adenopathy.  Psychiatric/Behavioral:       Denies current anxiety/depression       Past Medical History  Diagnosis Date  . Asthma   . History of chicken pox   . Depression   . Allergy   . GERD (gastroesophageal reflux disease)   . Migraines   . B12 deficiency 05/14/2015  . OSA (obstructive sleep apnea) 03/2016     Social History   Social History  . Marital Status: Married    Spouse Name: N/A  . Number of  Children: N/A  . Years of Education: N/A   Occupational History  . Not on file.   Social History Main Topics  . Smoking status: Never Smoker   . Smokeless tobacco: Not on file  . Alcohol Use: No  . Drug Use: No  . Sexual Activity: Not on file   Other Topics Concern  . 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    Has associates degree   Enjoys painting, animals, antiquing, shopping, reading, movies       Past Surgical History  Procedure Laterality Date  . Myringotomy Bilateral     twice each.  Eual Fines repair - left Left   . Cholecystectomy  2007  . Tonsillectomy and adenoidectomy  1976  . Inner ear surgery      Pt states she had her "eardrum rebuilt". Has had 8 sets of tubes in ears through adolecense  . Tubal ligation  2001  . Cesarean section  1991    Family History  Problem Relation Age of Onset  . Hyperlipidemia Mother   . Heart disease Mother   . Hypertension Mother   . Depression Mother   . Parkinson's disease Father   . Depression Maternal Aunt   . Alcohol abuse Maternal Uncle   . Depression Maternal Grandmother   . Heart disease Maternal Grandfather   .  Hypertension Maternal Grandfather   . Alcohol abuse Maternal Grandfather   . AVM Maternal Aunt     congenital  . Aneurysm Maternal Aunt     No Known Allergies  Current Outpatient Prescriptions on File Prior to Visit  Medication Sig Dispense Refill  . albuterol (PROAIR HFA) 108 (90 BASE) MCG/ACT inhaler Inhale 2 puffs into the lungs every 6 (six) hours as needed for wheezing or shortness of breath. 3.7 g 3  . betamethasone valerate ointment (VALISONE) 0.1 % Apply small amount to ear as needed for itching/scaling.    Marland Kitchen buPROPion (WELLBUTRIN XL) 300 MG 24 hr tablet TAKE 1 TABLET BY MOUTH DAILY. 90 tablet 0  . cetirizine (ZYRTEC) 10 MG tablet Take 10 mg by mouth daily.    . CYANOCOBALAMIN IJ Inject 1,000 mcg as directed  every 30 (thirty) days.    . Ergocalciferol (VITAMIN D2) 2000 UNITS TABS Take 1 tablet by mouth daily. Reported on 03/22/2016    . esomeprazole (NEXIUM) 40 MG capsule TAKE 1 CAPSULE BY MOUTH DAILY AT 12 NOON. 90 capsule 0  . montelukast (SINGULAIR) 10 MG tablet Take 1 tablet (10 mg total) by mouth at bedtime. 90 tablet 1  . topiramate (TOPAMAX) 50 MG tablet Take 1 tablet (50 mg total) by mouth 2 (two) times daily. 180 tablet 0  . venlafaxine XR (EFFEXOR XR) 37.5 MG 24 hr capsule 2 tabs PO daily 180 capsule 1   No current facility-administered medications on file prior to visit.    BP 132/78 mmHg  Pulse 72  Temp(Src) 98.1 F (36.7 C) (Oral)  Resp 16  Ht 5' (1.524 m)  Wt 157 lb 9.6 oz (71.487 kg)  BMI 30.78 kg/m2  SpO2 97%  LMP 03/23/2014    Objective:   Physical Exam  Physical Exam  Constitutional: She is oriented to person, place, and time. She appears well-developed and well-nourished. No distress.  HENT:  Head: Normocephalic and atraumatic.  Right Ear: Tympanic membrane and ear canal normal.  Left Ear: Tympanic membrane and ear canal normal.  Mouth/Throat: Oropharynx is clear and moist.  Eyes: Pupils are equal, round, and reactive to light. No scleral icterus.  Neck: Normal range of motion. No thyromegaly present.  Cardiovascular: Normal rate and regular rhythm.   No murmur heard. Pulmonary/Chest: Effort normal and breath sounds normal. No respiratory distress. He has no wheezes. She has no rales. She exhibits no tenderness.  Abdominal: Soft. Bowel sounds are normal. He exhibits no distension and no mass. There is no tenderness. There is no rebound and no guarding.  Musculoskeletal: She exhibits no edema.  Lymphadenopathy:    She has no cervical adenopathy.  Neurological: She is alert and oriented to person, place, and time. She has normal patellar reflexes. She exhibits normal muscle tone. Coordination normal.  Skin: Skin is warm and dry. multiple tattoos  noted. Psychiatric: She has a normal mood and affect. Her behavior is normal. Judgment and thought content normal.  Breasts: Examined lying Right: Without masses, retractions, discharge or axillary adenopathy.  Left: Without masses, retractions, discharge or axillary adenopathy.            Assessment & Plan:        Assessment & Plan:  EKG tracing is personally reviewed.  EKG notes NSR.  No acute changes.

## 2016-05-06 NOTE — Assessment & Plan Note (Signed)
Commended patient on her healthy diet, exercise, weight loss. Obtain routine lab work. Immunizations reviewed and up to date. Refer for colo and mammogram.

## 2016-05-06 NOTE — Assessment & Plan Note (Signed)
Stable. Patient would like to try to come off of effexor. Advised pt to taper as outlined in AVS. Call if problems/concerns.

## 2016-05-06 NOTE — Assessment & Plan Note (Signed)
Feeling much better since she started cpap.

## 2016-05-06 NOTE — Addendum Note (Signed)
Addended by: Debbrah Alar on: 05/06/2016 12:11 PM   Modules accepted: Miquel Dunn

## 2016-05-06 NOTE — Assessment & Plan Note (Signed)
Migraines have resolved since she started CPAP. She would like to wean off of topamax.  See AVS for instructions.

## 2016-05-06 NOTE — Assessment & Plan Note (Signed)
Will check UA and culture to rule out UTI. Trial of myrbetriq.

## 2016-05-06 NOTE — Patient Instructions (Addendum)
Please complete lab work prior to leaving. Schedule mammogram on the first floor. You will be contacted about scheduling your colonoscopy.  Decrease effexor to 1 tab once daily for 1 month. Then every other day for 1 week, then every 3rd day for 1 week, then stop. After you complete effexor taper, you can taper topamax as follows:  25mg  in AM and 50mg  in PM daily for 4 days Then 25mg  in AM and 25mg  in PM daily for 4 days Then 25mg  in AM and no dose in PM for 4 days Then stop   Call if recurrent headaches or depression symptoms occur.    Begin myrbetriq for overactive bladder.

## 2016-05-08 LAB — URINE CULTURE

## 2016-05-13 ENCOUNTER — Ambulatory Visit (HOSPITAL_BASED_OUTPATIENT_CLINIC_OR_DEPARTMENT_OTHER)
Admission: RE | Admit: 2016-05-13 | Discharge: 2016-05-13 | Disposition: A | Discharge status: Home / Self Care | Payer: 59 | Source: Ambulatory Visit | Attending: Family | Admitting: Family

## 2016-05-13 DIAGNOSIS — Z0000 Encounter for general adult medical examination without abnormal findings: Secondary | ICD-10-CM

## 2016-05-13 DIAGNOSIS — Z1231 Encounter for screening mammogram for malignant neoplasm of breast: Secondary | ICD-10-CM | POA: Insufficient documentation

## 2016-05-24 ENCOUNTER — Ambulatory Visit (INDEPENDENT_AMBULATORY_CARE_PROVIDER_SITE_OTHER): Payer: 59 | Admitting: Behavioral Health

## 2016-05-24 DIAGNOSIS — E538 Deficiency of other specified B group vitamins: Secondary | ICD-10-CM

## 2016-05-24 MED ORDER — CYANOCOBALAMIN 1000 MCG/ML IJ SOLN
1000.0000 ug | Freq: Once | INTRAMUSCULAR | Status: AC
  Administered 2016-05-24: 1000 ug via INTRAMUSCULAR

## 2016-05-24 NOTE — Progress Notes (Signed)
Pre visit review using our clinic review tool, if applicable. No additional management support is needed unless otherwise documented below in the visit note.  Patient in clinic today for B12 injection. IM given in Right Deltoid. Patient tolerated injection well.  Next appointment scheduled for 06/24/16 at 3:30 PM.

## 2016-06-01 ENCOUNTER — Encounter: Payer: Self-pay | Admitting: Pulmonary Disease

## 2016-06-01 ENCOUNTER — Ambulatory Visit (INDEPENDENT_AMBULATORY_CARE_PROVIDER_SITE_OTHER): Payer: 59 | Admitting: Pulmonary Disease

## 2016-06-01 DIAGNOSIS — G4733 Obstructive sleep apnea (adult) (pediatric): Secondary | ICD-10-CM | POA: Diagnosis not present

## 2016-06-01 DIAGNOSIS — J4520 Mild intermittent asthma, uncomplicated: Secondary | ICD-10-CM | POA: Diagnosis not present

## 2016-06-01 NOTE — Progress Notes (Signed)
Subjective:    Patient ID: Terri Johnson, female    DOB: [birth year], 50 y.o.   MRN: [id number]  HPI   This is the case of Terri Johnson, 50 y.o. Female, who was referred by Debbrah Alar in consultation regarding hypersomnia.   As you very well know, patient Is here for hypersomnia. Nonsmoker. She has had asthma since she was a child. Stable. No issues with it.  Patient has been sleepy/hypersomnia for the past 2 years. Hypersomnia affects her functionality. She sleeps 7 hours per night. She wakes up unrefreshed. She snores, gasps, chokes, has witnessed apneas. She works 6:15 AM until 2:45 PM Monday to Friday at [employer]. 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 She struggles driving back home.  She tosses and turns. No other abnormal behavior and sleep.  ROV (03/23/2016)  Pt returns to office as f/u after her HST.  Had HST which showed AHI 6-7.  She continues to have hypersomnia and she is struggling everyday at work to stay awake.  Denies depression or anxiety. Denies taking sedating meds.    ROV (06/01/16) Pt returns to the office as f/u on her osa. Since last seen, she was started on autocpap. Feels better using it. More energy. Less sleepiness. Feels benefit of CPAP. Download the last month, 100%, AHI 2.4. Significantly improved at work as far as hypersomnia is concerned. No other issues.    Review of Systems  Constitutional: Negative.  Negative for fever and unexpected weight change.  HENT: Positive for congestion. Negative for dental problem, ear pain, nosebleeds, postnasal drip, rhinorrhea, sinus pressure, sneezing, sore throat and trouble swallowing.   Eyes: Negative for redness and itching.  Respiratory: Positive for shortness of breath. Negative for cough, chest tightness and wheezing.   Cardiovascular: Negative.  Negative for palpitations and leg swelling.  Gastrointestinal: Negative.  Negative for nausea and vomiting.    Endocrine: Negative.   Genitourinary: Negative.  Negative for dysuria.  Musculoskeletal: Negative.  Negative for joint swelling.  Skin: Negative.  Negative for rash.  Allergic/Immunologic: Positive for environmental allergies.  Neurological: Positive for headaches.  Hematological: Negative.  Does not bruise/bleed easily.  Psychiatric/Behavioral: Negative.  Negative for dysphoric mood. The patient is not nervous/anxious.         Objective:   Physical Exam  Vitals:  Vitals:   06/01/16 1559  BP: 132/82  Pulse: 94  SpO2: 99%  Weight: 155 lb (70.3 kg)  Height: 5' (1.524 m)    Constitutional/General:  Pleasant, well-nourished, well-developed, not in any distress,  Comfortably seating.  Well kempt  Body mass index is 30.27 kg/m. Wt Readings from Last 3 Encounters:  06/01/16 155 lb (70.3 kg)  05/06/16 157 lb 9.6 oz (71.5 kg)  03/23/16 162 lb (73.5 kg)      HEENT: Pupils equal and reactive to light and accommodation. Anicteric sclerae. Normal nasal mucosa.   No oral  lesions,  mouth clear,  oropharynx clear, no postnasal drip. (-) Oral thrush. No dental caries.  Airway - Mallampati class III  Neck: No masses. Midline trachea. No JVD, (-) LAD. (-) bruits appreciated.  Respiratory/Chest: Grossly normal chest. (-) deformity. (-) Accessory muscle use.  Symmetric expansion. (-) Tenderness on palpation.  Resonant on percussion.  Diminished BS on both lower lung zones. (-) wheezing, crackles, rhonchi (-) egophony  Cardiovascular: Regular rate and  rhythm, heart sounds normal, no murmur or gallops, no peripheral edema  Gastrointestinal:  Normal  bowel sounds. Soft, non-tender. No hepatosplenomegaly.  (-) masses.   Musculoskeletal:  Normal muscle tone. Normal gait.   Extremities: Grossly normal. (-) clubbing, cyanosis.  (-) edema  Skin: (-) rash,lesions seen.   Neurological/Psychiatric : alert, oriented to time, place, person. Normal mood and affect             Assessment & Plan:  OSA (obstructive sleep apnea) Pt with recent dx of OSA. Hypersomnia was affecting her work.   Home sleep study in 08/2015-- AHI 4.8.  Pt with repeat HST in 03/09/2016 -- AHI 6  Using cpap. She feels significantly improved with cpap. More energy. Less sleepiness. Feels benefit of cpap.  DL the last month > 100%, AHI 2.4.  Significantly improved at work.   We extensively discussed the importance of treating OSA and the need to use PAP therapy.   Continue with autocpap 5-15. Tolerating pressure and mask.    Patient was instructed to have mask, tubings, filter, reservoir cleaned at least once a week with soapy water.  Patient was instructed to call the office if he/she is having issues with the PAP device.    I advised patient to obtain sufficient amount of sleep --  7 to 8 hours at least in a 24 hr period.  Patient was advised to follow good sleep hygiene.  Patient was advised NOT to engage in activities requiring concentration and/or vigilance if he/she is and  sleepy.  Patient is NOT to drive if he/she is sleepy.   Asthma Asthma has been stable. Childhood asthma. Alb prn.     Return to clinic in 12 mos.       Monica Becton, MD 06/02/2016   2:26 AM [facility] [place] Pager: [phone] Office: [phone], Fax: [phone]

## 2016-06-01 NOTE — Assessment & Plan Note (Addendum)
Pt with recent dx of OSA. Hypersomnia was affecting her work.   Home sleep study in 08/2015-- AHI 4.8.  Pt with repeat HST in 03/09/2016 -- AHI 6  Using cpap. She feels significantly improved with cpap. More energy. Less sleepiness. Feels benefit of cpap.  DL the last month > 100%, AHI 2.4.  Significantly improved at work.   We extensively discussed the importance of treating OSA and the need to use PAP therapy.   Continue with autocpap 5-15. Tolerating pressure and mask.    Patient was instructed to have mask, tubings, filter, reservoir cleaned at least once a week with soapy water.  Patient was instructed to call the office if he/she is having issues with the PAP device.    I advised patient to obtain sufficient amount of sleep --  7 to 8 hours at least in a 24 hr period.  Patient was advised to follow good sleep hygiene.  Patient was advised NOT to engage in activities requiring concentration and/or vigilance if he/she is and  sleepy.  Patient is NOT to drive if he/she is sleepy.

## 2016-06-01 NOTE — Patient Instructions (Signed)
  It was a pleasure taking care of you today!  Continue using your CPAP machine.   Please make sure you use your CPAP device everytime you sleep.  We will monitor the usage of your machine per your insurance requirement.  Your insurance company may take the machine from you if you are not using it regularly.   Please clean the mask, tubings, filter, water reservoir with soapy water every week.  Please use distilled water for the water reservoir.   Please call the office or your machine provider (DME company) if you are having issues with the device.   Return to clinic in 1 year    

## 2016-06-02 NOTE — Assessment & Plan Note (Signed)
Asthma has been stable. Childhood asthma. Alb prn.

## 2016-06-04 ENCOUNTER — Ambulatory Visit: Payer: 59 | Admitting: Neurology

## 2016-06-04 ENCOUNTER — Encounter: Payer: Self-pay | Admitting: Pulmonary Disease

## 2016-06-05 DIAGNOSIS — J45901 Unspecified asthma with (acute) exacerbation: Secondary | ICD-10-CM | POA: Diagnosis not present

## 2016-06-05 DIAGNOSIS — G4733 Obstructive sleep apnea (adult) (pediatric): Secondary | ICD-10-CM | POA: Diagnosis not present

## 2016-06-07 ENCOUNTER — Encounter: Payer: Self-pay | Admitting: Family

## 2016-06-17 ENCOUNTER — Other Ambulatory Visit: Payer: Self-pay | Admitting: Family

## 2016-06-17 MED FILL — BUPROPION HCL XL 300 MG TAB: 300 | 90 days supply | Qty: 90 | Fill #0

## 2016-06-24 ENCOUNTER — Ambulatory Visit (INDEPENDENT_AMBULATORY_CARE_PROVIDER_SITE_OTHER): Payer: 59 | Admitting: *Deleted

## 2016-06-24 DIAGNOSIS — E538 Deficiency of other specified B group vitamins: Secondary | ICD-10-CM | POA: Diagnosis not present

## 2016-06-24 MED ORDER — CYANOCOBALAMIN 1000 MCG/ML IJ SOLN
1000.0000 ug | Freq: Once | INTRAMUSCULAR | Status: AC
  Administered 2016-06-24: 1000 ug via INTRAMUSCULAR

## 2016-06-24 NOTE — Progress Notes (Signed)
Pre visit review using our clinic review tool, if applicable. No additional management support is needed unless otherwise documented below in the visit note.  Patient tolerated injection well. Next appointment:  07/27/16.   Dorrene German, RN

## 2016-07-01 ENCOUNTER — Other Ambulatory Visit: Payer: Self-pay | Admitting: Family

## 2016-07-01 MED FILL — ESOMEPRAZOLE MAG DR 40 MG C: 40 | 90 days supply | Qty: 90 | Fill #0

## 2016-07-05 ENCOUNTER — Ambulatory Visit: Payer: 59 | Admitting: Family

## 2016-07-06 DIAGNOSIS — G4733 Obstructive sleep apnea (adult) (pediatric): Secondary | ICD-10-CM | POA: Diagnosis not present

## 2016-07-06 DIAGNOSIS — J45901 Unspecified asthma with (acute) exacerbation: Secondary | ICD-10-CM | POA: Diagnosis not present

## 2016-07-08 ENCOUNTER — Ambulatory Visit (INDEPENDENT_AMBULATORY_CARE_PROVIDER_SITE_OTHER): Payer: 59 | Admitting: Podiatry

## 2016-07-08 DIAGNOSIS — L03039 Cellulitis of unspecified toe: Secondary | ICD-10-CM | POA: Diagnosis not present

## 2016-07-08 DIAGNOSIS — M79676 Pain in unspecified toe(s): Secondary | ICD-10-CM | POA: Diagnosis not present

## 2016-07-08 DIAGNOSIS — L600 Ingrowing nail: Secondary | ICD-10-CM

## 2016-07-08 NOTE — Patient Instructions (Signed)

## 2016-07-08 NOTE — Progress Notes (Signed)
   Subjective:    Patient ID: Terri Johnson, female    DOB: [birth year], 50 y.o.   MRN: [id number]  HPI    Review of Systems  All other systems reviewed and are negative.      Objective:   Physical Exam        Assessment & Plan:

## 2016-07-11 NOTE — Progress Notes (Signed)
Patient ID: Terri Johnson, female   DOB: 07-19-1966, 50 y.o.   MRN: [id number] Subjective: 50 year old female presents to the office today for bilateral great toes. Patient states that she has ingrown nails which been painful for some time now. 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 Patient states the ingrown toenails have been recurrent and neck her off and on multiple times. Patient presents today for further treatment and evaluation  Patient Active Problem List   Diagnosis Date Noted  . Overactive bladder 05/06/2016  . OSA (obstructive sleep apnea) 03/24/2016  . Migraines 03/23/2016  . Vitamin D deficiency 08/08/2015  . Vitamin B12 deficiency anemia 07/04/2015  . B12 deficiency 05/14/2015  . Hot flashes 05/11/2015  . Preventative health care 05/11/2015  . Asthma 03/24/2015  . GERD (gastroesophageal reflux disease) 03/24/2015  . Anxiety and depression 03/24/2015  . Headache 03/24/2015    No Known Allergies  Objective:  General: Well developed, nourished, in no acute distress, alert and oriented x3   Dermatology: Skin is warm, dry and supple bilateral. Bilateral hallux nail appears to be severely incurvated with hyperkeratosis formation at the distal aspects of  the medial and lateral nail border. The remaining nails appear unremarkable at this time. There are no open sores, lesions.  Vascular: Dorsalis Pedis artery and Posterior Tibial artery pedal pulses palpable. No lower extremity edema noted.   Neruologic: Grossly intact via light touch bilateral.  Musculoskeletal: Tenderness to palpation of the lateral borders of the bilateral great toe nail fold(s). Muscular strength within normal limits in all groups bilateral.   Assesement: #1 pot pain in bilateral great toes. #2 paronychia bilateral great toes lateral borders. #3 onychocryptosis bilateral great toes lateral borders.    Plan of Care:  1. Patient evaluated.  2. Discussed treatment alternatives and  plan of care; Explained nail avulsion procedure and post procedure course to patient. 3. Patient opted for partial nail avulsion of the bilateral great toes.  4. Prior to procedure, local anesthesia infiltration utilized using 3 ml of a 50:50 mixture of 2% plain lidocaine and 0.5% plain marcaine in a normal hallux block fashion and a betadine prep performed.  5. Partial permanent nail avulsion performed including the respective nail matrix using [id number] applications of phenol followed by alcohol flush on the.  6. Light dressing applied. 7. Return to clinic in 2 weeks.   Edrick Kins, DPM

## 2016-07-12 MED FILL — MYRBETRIQ ER 25 MG TABLET: 25 | 30 days supply | Qty: 30 | Fill #1

## 2016-07-22 ENCOUNTER — Ambulatory Visit: Payer: 59 | Admitting: Podiatry

## 2016-07-23 MED FILL — MONTELUKAST SOD 10 MG TAB: 10 | 90 days supply | Qty: 90 | Fill #1

## 2016-07-26 ENCOUNTER — Ambulatory Visit (INDEPENDENT_AMBULATORY_CARE_PROVIDER_SITE_OTHER): Payer: 59 | Admitting: Family

## 2016-07-26 ENCOUNTER — Encounter: Payer: Self-pay | Admitting: Family

## 2016-07-26 VITALS — BP 126/80 | HR 107 | Temp 98.2°F | Resp 16 | Ht 60.0 in | Wt 156.0 lb

## 2016-07-26 DIAGNOSIS — M5442 Lumbago with sciatica, left side: Secondary | ICD-10-CM

## 2016-07-26 DIAGNOSIS — E538 Deficiency of other specified B group vitamins: Secondary | ICD-10-CM | POA: Diagnosis not present

## 2016-07-26 DIAGNOSIS — F419 Anxiety disorder, unspecified: Secondary | ICD-10-CM

## 2016-07-26 DIAGNOSIS — F32A Depression, unspecified: Secondary | ICD-10-CM

## 2016-07-26 DIAGNOSIS — D519 Vitamin B12 deficiency anemia, unspecified: Secondary | ICD-10-CM | POA: Diagnosis not present

## 2016-07-26 DIAGNOSIS — F418 Other specified anxiety disorders: Secondary | ICD-10-CM

## 2016-07-26 DIAGNOSIS — F329 Major depressive disorder, single episode, unspecified: Secondary | ICD-10-CM

## 2016-07-26 MED ORDER — MELOXICAM 7.5 MG PO TABS: 7.5000 mg | ORAL_TABLET | Freq: Every day | ORAL | 0 refills | Status: DC

## 2016-07-26 MED ORDER — CYCLOBENZAPRINE HCL 5 MG PO TABS: 5.0000 mg | ORAL_TABLET | Freq: Three times a day (TID) | ORAL | 0 refills | Status: DC | PRN

## 2016-07-26 MED ORDER — CYANOCOBALAMIN 1000 MCG/ML IJ SOLN
1000.0000 ug | INTRAMUSCULAR | Status: AC
  Administered 2016-07-26: 1000 ug via INTRAMUSCULAR

## 2016-07-26 MED FILL — CYCLOBENZAPRINE 5 MG TABLET: 5 | 7 days supply | Qty: 20 | Fill #0

## 2016-07-26 MED FILL — MELOXICAM 7.5 MG TABLET: 7.5 | 14 days supply | Qty: 14 | Fill #0

## 2016-07-26 NOTE — Progress Notes (Signed)
Subjective:    Patient ID: Terri Johnson, female    DOB: [birth year], 50 y.o.   MRN: [id number]  HPI   Terri Johnson is a 50 yr old female who presents today for follow up.  1) anxiety/depression- She is maintained on wellbutrin. Reports mood is good, anxiety is well controlled. Feels much better since she started CPAP back in June.    2) Back pain- reports low back pain since Thursday 07/22/16.  She reports that pain radiates down the back of the leg.  She has been using tylenol without improvement.   3) B12 deficiency- requesting b12 injection today.   Review of Systems See HPI  Past Medical History:  Diagnosis Date  . Allergy   . Asthma   . B12 deficiency 05/14/2015  . Depression   . GERD (gastroesophageal reflux disease)   . History of chicken pox   . Migraines   . OSA (obstructive sleep apnea) 03/2016     Social History   Social History  . Marital status: Married    Spouse name: N/A  . Number of children: N/A  . Years of education: N/A   Occupational History  . Not on file.   Social History Main Topics  . Smoking status: Never Smoker  . Smokeless tobacco: Not on file  . Alcohol use No  . Drug use: No  . Sexual activity: Not on file   Other Topics Concern  . 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    Has associates degree   Enjoys painting, animals, antiquing, shopping, reading, movies       Past Surgical History:  Procedure Laterality Date  . CESAREAN SECTION  1991  . CHOLECYSTECTOMY  2007  . eardrum repair - left Left   . INNER EAR SURGERY     Pt states she had her "eardrum rebuilt". Has had 8 sets of tubes in ears through [facility]  . MYRINGOTOMY Bilateral    twice each.  . TONSILLECTOMY AND ADENOIDECTOMY  1976  . TUBAL LIGATION  2001    Family History  Problem Relation Age of Onset  . Hyperlipidemia Mother   . Heart disease Mother   . Hypertension Mother   .  Depression Mother   . Parkinson's disease Father   . Depression Maternal Aunt   . Alcohol abuse Maternal Uncle   . Depression Maternal Grandmother   . Heart disease Maternal Grandfather   . Hypertension Maternal Grandfather   . Alcohol abuse Maternal Grandfather   . AVM Maternal Aunt     congenital  . Aneurysm Maternal Aunt     No Known Allergies  Current Outpatient Prescriptions on File Prior to Visit  Medication Sig Dispense Refill  . albuterol (PROAIR HFA) 108 (90 BASE) MCG/ACT inhaler Inhale 2 puffs into the lungs every 6 (six) hours as needed for wheezing or shortness of breath. 3.7 g 3  . betamethasone valerate ointment (VALISONE) 0.1 % Apply small amount to ear as needed for itching/scaling.    Marland Kitchen buPROPion (WELLBUTRIN XL) 300 MG 24 hr tablet Take 1 tablet (300 mg total) by mouth daily. 90 tablet 0  . cetirizine (ZYRTEC) 10 MG tablet Take 10 mg by mouth daily.    . CYANOCOBALAMIN IJ Inject 1,000 mcg as directed every 30 (thirty) days.    . Ergocalciferol (VITAMIN D2) 2000 UNITS TABS Take 1 tablet by mouth daily.  Reported on 03/22/2016    . esomeprazole (NEXIUM) 40 MG capsule TAKE 1 CAPSULE BY MOUTH DAILY AT 12 NOON. 90 capsule 3  . mirabegron ER (MYRBETRIQ) 25 MG TB24 tablet Take 1 tablet (25 mg total) by mouth daily. 30 tablet 2  . montelukast (SINGULAIR) 10 MG tablet Take 1 tablet (10 mg total) by mouth at bedtime. 90 tablet 1   No current facility-administered medications on file prior to visit.     BP 126/80 (BP Location: Right Arm, Cuff Size: Normal)   Pulse (!) 107   Temp 98.2 F (36.8 C) (Oral)   Resp 16   Ht 5' (1.524 m)   Wt 156 lb (70.8 kg)   LMP 03/23/2014   SpO2 98% Comment: room air  BMI 30.47 kg/m       Objective:   Physical Exam  Constitutional: She is oriented to person, place, and time. She appears well-developed and well-nourished.  HENT:  Head: Normocephalic and atraumatic.  Cardiovascular: Normal rate, regular rhythm and normal heart sounds.     No murmur heard. Pulmonary/Chest: Effort normal and breath sounds normal. No respiratory distress. She has no wheezes.  Musculoskeletal:       Cervical back: She exhibits no tenderness.       Thoracic back: She exhibits no tenderness.       Lumbar back: She exhibits tenderness.  Neurological: She is alert and oriented to person, place, and time.  Reflex Scores:      Patellar reflexes are 2+ on the right side and 2+ on the left side. 5/5 bilateral LE strength  Psychiatric: She has a normal mood and affect. Her behavior is normal. Judgment and thought content normal.          Assessment & Plan:  Low back pain with sciatica- will rx with meloxicam and prn flexeril. Pt advised to call if new/worsening symptoms/bowel or bladder incontinence.

## 2016-07-26 NOTE — Assessment & Plan Note (Signed)
b12 injection today. Continue monthly.  ?

## 2016-07-26 NOTE — Assessment & Plan Note (Signed)
Stable on wellbutrin. Continue same.  

## 2016-07-26 NOTE — Patient Instructions (Signed)
Please begin meloxicam once daily for back pain (anti-inflammatory). You may use flexeril (muscle relaxer 3 times daily as needed)- this may cause drowsiness so don't drive after taking.    Sciatica Sciatica is pain, weakness, numbness, or tingling along the path of the sciatic nerve. The nerve starts in the lower back and runs down the back of each leg. The nerve controls the muscles in the lower leg and in the back of the knee, while also providing sensation to the back of the thigh, lower leg, and the sole of your foot. Sciatica is a symptom of another medical condition. For instance, nerve damage or certain conditions, such as a herniated disk or bone spur on the spine, pinch or put pressure on the sciatic nerve. This causes the pain, weakness, or other sensations normally associated with sciatica. Generally, sciatica only affects one side of the body. CAUSES   Herniated or slipped disc.  Degenerative disk disease.  A pain disorder involving the narrow muscle in the buttocks (piriformis syndrome).  Pelvic injury or fracture.  Pregnancy.  Tumor (rare). SYMPTOMS  Symptoms can vary from mild to very severe. The symptoms usually travel from the low back to the buttocks and down the back of the leg. Symptoms can include:  Mild tingling or dull aches in the lower back, leg, or hip.  Numbness in the back of the calf or sole of the foot.  Burning sensations in the lower back, leg, or hip.  Sharp pains in the lower back, leg, or hip.  Leg weakness.  Severe back pain inhibiting movement. These symptoms may get worse with coughing, sneezing, laughing, or prolonged sitting or standing. Also, being overweight may worsen symptoms. DIAGNOSIS  Your caregiver will perform a physical exam to look for common symptoms of sciatica. He or she may ask you to do certain movements or activities that would trigger sciatic nerve pain. Other tests may be performed to find the cause of the sciatica. These  may include:  Blood tests.  X-rays.  Imaging tests, such as an MRI or CT scan. TREATMENT  Treatment is directed at the cause of the sciatic pain. Sometimes, treatment is not necessary and the pain and discomfort goes away on its own. If treatment is needed, your caregiver may suggest:  Over-the-counter medicines to relieve pain.  Prescription medicines, such as anti-inflammatory medicine, muscle relaxants, or narcotics.  Applying heat or ice to the painful area.  Steroid injections to lessen pain, irritation, and inflammation around the nerve.  Reducing activity during periods of pain.  Exercising and stretching to strengthen your abdomen and improve flexibility of your spine. Your caregiver may suggest losing weight if the extra weight makes the back pain worse.  Physical therapy.  Surgery to eliminate what is pressing or pinching the nerve, such as a bone spur or part of a herniated disk. HOME CARE INSTRUCTIONS   Only take over-the-counter or prescription medicines for pain or discomfort as directed by your caregiver.  Apply ice to the affected area for 20 minutes, 3-4 times a day for the first 48-72 hours. Then try heat in the same way.  Exercise, stretch, or perform your usual activities if these do not aggravate your pain.  Attend physical therapy sessions as directed by your caregiver.  Keep all follow-up appointments as directed by your caregiver.  Do not wear high heels or shoes that do not provide proper support.  Check your mattress to see if it is too soft. A firm mattress may  lessen your pain and discomfort. SEEK IMMEDIATE MEDICAL CARE IF:   You lose control of your bowel or bladder (incontinence).  You have increasing weakness in the lower back, pelvis, buttocks, or legs.  You have redness or swelling of your back.  You have a burning sensation when you urinate.  You have pain that gets worse when you lie down or awakens you at night.  Your pain is  worse than you have experienced in the past.  Your pain is lasting longer than 4 weeks.  You are suddenly losing weight without reason. MAKE SURE YOU:  Understand these instructions.  Will watch your condition.  Will get help right away if you are not doing well or get worse.   This information is not intended to replace advice given to you by your health care provider. Make sure you discuss any questions you have with your health care provider.   Document Released: 09/28/2001 Document Revised: 06/25/2015 Document Reviewed: 02/13/2012 Elsevier Interactive Patient Education [employer].

## 2016-07-26 NOTE — Progress Notes (Signed)
Pre visit review using our clinic review tool, if applicable. No additional management support is needed unless otherwise documented below in the visit note. 

## 2016-07-26 NOTE — Assessment & Plan Note (Signed)
b12 injection today.  

## 2016-07-27 ENCOUNTER — Ambulatory Visit: Payer: 59

## 2016-08-02 DIAGNOSIS — J45901 Unspecified asthma with (acute) exacerbation: Secondary | ICD-10-CM | POA: Diagnosis not present

## 2016-08-02 DIAGNOSIS — G4733 Obstructive sleep apnea (adult) (pediatric): Secondary | ICD-10-CM | POA: Diagnosis not present

## 2016-08-04 ENCOUNTER — Telehealth: Payer: Self-pay | Admitting: Family

## 2016-08-04 MED ORDER — METHYLPREDNISOLONE 4 MG PO TBPK: ORAL_TABLET | ORAL | 0 refills | Status: DC

## 2016-08-04 MED FILL — METHYLPREDNISOLONE 4 MG TAB: 4 | 6 days supply | Qty: 21 | Fill #0

## 2016-08-04 NOTE — Telephone Encounter (Signed)
D/c meloxicam, trial of medrol dose pak. Let me know if not improved in 1 week.

## 2016-08-04 NOTE — Telephone Encounter (Signed)
Caller name: Silena Relationship to patient: self Can be reached: [phone]  Reason for call: Pt still experiencing back pain radiating down back of leg. She is out of meloxicam and still taking flexeril prn. Pt wanting to know next step (radiology, f/u appt, etc). Pt requesting call.

## 2016-08-04 NOTE — Telephone Encounter (Signed)
Notified pt and she voices understanding. 

## 2016-08-05 DIAGNOSIS — J45901 Unspecified asthma with (acute) exacerbation: Secondary | ICD-10-CM | POA: Diagnosis not present

## 2016-08-05 DIAGNOSIS — G4733 Obstructive sleep apnea (adult) (pediatric): Secondary | ICD-10-CM | POA: Diagnosis not present

## 2016-08-13 DIAGNOSIS — H9203 Otalgia, bilateral: Secondary | ICD-10-CM | POA: Diagnosis not present

## 2016-08-13 DIAGNOSIS — H6123 Impacted cerumen, bilateral: Secondary | ICD-10-CM | POA: Diagnosis not present

## 2016-08-13 DIAGNOSIS — H906 Mixed conductive and sensorineural hearing loss, bilateral: Secondary | ICD-10-CM | POA: Diagnosis not present

## 2016-08-13 DIAGNOSIS — H60543 Acute eczematoid otitis externa, bilateral: Secondary | ICD-10-CM | POA: Diagnosis not present

## 2016-08-19 MED FILL — MYRBETRIQ ER 25 MG TABLET: 25 | 30 days supply | Qty: 30 | Fill #2

## 2016-08-31 ENCOUNTER — Ambulatory Visit (INDEPENDENT_AMBULATORY_CARE_PROVIDER_SITE_OTHER): Payer: 59 | Admitting: Behavioral Health

## 2016-08-31 DIAGNOSIS — E538 Deficiency of other specified B group vitamins: Secondary | ICD-10-CM

## 2016-08-31 MED ORDER — CYANOCOBALAMIN 1000 MCG/ML IJ SOLN
1000.0000 ug | Freq: Once | INTRAMUSCULAR | Status: AC
  Administered 2016-08-31: 1000 ug via INTRAMUSCULAR

## 2016-08-31 NOTE — Progress Notes (Signed)
Pre visit review using our clinic review tool, if applicable. No additional management support is needed unless otherwise documented below in the visit note.  Patient in clinic today for B12 injection. IM given in Left Deltoid. Patient tolerated injection well.  Next appointment scheduled for 09/30/16 at 3:45 PM.

## 2016-09-01 NOTE — Progress Notes (Signed)
Noted  

## 2016-09-05 DIAGNOSIS — J45901 Unspecified asthma with (acute) exacerbation: Secondary | ICD-10-CM | POA: Diagnosis not present

## 2016-09-05 DIAGNOSIS — G4733 Obstructive sleep apnea (adult) (pediatric): Secondary | ICD-10-CM | POA: Diagnosis not present

## 2016-09-16 ENCOUNTER — Other Ambulatory Visit: Payer: Self-pay | Admitting: Family

## 2016-09-16 MED FILL — BUPROPION HCL XL 300 MG TAB: 300 | 90 days supply | Qty: 90 | Fill #0

## 2016-09-16 MED FILL — MYRBETRIQ ER 25 MG TABLET: 25 | 30 days supply | Qty: 30 | Fill #0 | Status: TO

## 2016-09-16 NOTE — Telephone Encounter (Signed)
Medication filled to pharmacy as requested.   

## 2016-09-30 ENCOUNTER — Telehealth: Payer: Self-pay | Admitting: Family

## 2016-09-30 ENCOUNTER — Ambulatory Visit (INDEPENDENT_AMBULATORY_CARE_PROVIDER_SITE_OTHER): Payer: 59

## 2016-09-30 DIAGNOSIS — E538 Deficiency of other specified B group vitamins: Secondary | ICD-10-CM

## 2016-09-30 MED ORDER — CYANOCOBALAMIN 1000 MCG/ML IJ SOLN
1000.0000 ug | Freq: Once | INTRAMUSCULAR | Status: AC
  Administered 2016-09-30: 1000 ug via INTRAMUSCULAR

## 2016-09-30 NOTE — Progress Notes (Signed)
Pre visit review using our clinic tool,if applicable. No additional management support is needed unless otherwise documented below in the visit note.   Patient in for B12 injection per order from M. O'sullivan due to B12 dificiency. Return appointment given.

## 2016-09-30 NOTE — Telephone Encounter (Signed)
Pt dropped off APR report for Melissa to fill out, documents were placed in tray at front office

## 2016-10-05 DIAGNOSIS — G4733 Obstructive sleep apnea (adult) (pediatric): Secondary | ICD-10-CM | POA: Diagnosis not present

## 2016-10-05 DIAGNOSIS — J45901 Unspecified asthma with (acute) exacerbation: Secondary | ICD-10-CM | POA: Diagnosis not present

## 2016-10-12 NOTE — Telephone Encounter (Signed)
Form completed and faxed to [employer]Medical Underwriting Department at [phone].

## 2016-10-21 ENCOUNTER — Other Ambulatory Visit: Payer: Self-pay | Admitting: Family

## 2016-10-21 MED FILL — MYRBETRIQ ER 25 MG TABLET: 25 | 30 days supply | Qty: 30 | Fill #0

## 2016-11-02 ENCOUNTER — Ambulatory Visit (INDEPENDENT_AMBULATORY_CARE_PROVIDER_SITE_OTHER): Payer: 59

## 2016-11-02 DIAGNOSIS — E538 Deficiency of other specified B group vitamins: Secondary | ICD-10-CM

## 2016-11-02 MED ORDER — CYANOCOBALAMIN 1000 MCG/ML IJ SOLN
1000.0000 ug | Freq: Once | INTRAMUSCULAR | Status: AC
  Administered 2016-11-02: 1000 ug via INTRAMUSCULAR

## 2016-11-02 NOTE — Progress Notes (Signed)
Pre visit review using our clinic tool,if applicable. No additional management support is needed unless otherwise documented below in the visit note.    Patient in for B12 injection. Given IM R. Deltoid. No complaints voiced.

## 2016-11-16 ENCOUNTER — Ambulatory Visit (INDEPENDENT_AMBULATORY_CARE_PROVIDER_SITE_OTHER): Payer: 59 | Admitting: Medical

## 2016-11-16 ENCOUNTER — Encounter: Payer: Self-pay | Admitting: Medical

## 2016-11-16 VITALS — BP 121/76 | HR 110 | Temp 98.1°F | Resp 16 | Ht 60.0 in | Wt 162.2 lb

## 2016-11-16 DIAGNOSIS — J0100 Acute maxillary sinusitis, unspecified: Secondary | ICD-10-CM

## 2016-11-16 MED ORDER — BENZONATATE 100 MG PO CAPS: 100.0000 mg | ORAL_CAPSULE | Freq: Three times a day (TID) | ORAL | 0 refills | Status: DC | PRN

## 2016-11-16 MED ORDER — AMOXICILLIN-POT CLAVULANATE 875-125 MG PO TABS: 1.0000 | ORAL_TABLET | Freq: Two times a day (BID) | ORAL | 0 refills | Status: DC

## 2016-11-16 MED ORDER — FLUTICASONE PROPIONATE 50 MCG/ACT NA SUSP: 2.0000 | Freq: Every day | NASAL | 1 refills | Status: DC

## 2016-11-16 MED ORDER — METHYLPREDNISOLONE ACETATE 40 MG/ML IJ SUSP
40.0000 mg | Freq: Once | INTRAMUSCULAR | Status: AC
  Administered 2016-11-16: 40 mg via INTRAMUSCULAR

## 2016-11-16 MED FILL — BENZONATATE 100 MG CAP: 100 | 7 days supply | Qty: 21 | Fill #0

## 2016-11-16 MED FILL — FLUTICASONE PROP 50 MCG SPR: 50 | 30 days supply | Qty: 16 | Fill #0

## 2016-11-16 MED FILL — AMOX-CLAV 875-125 MG TABLET: 875-125 | 10 days supply | Qty: 20 | Fill #0

## 2016-11-16 NOTE — Progress Notes (Signed)
Pre visit review using our clinic review tool, if applicable. No additional management support is needed unless otherwise documented below in the visit note/SLS  

## 2016-11-16 NOTE — Progress Notes (Signed)
Subjective:    Patient ID: Terri Johnson, female    DOB: 1965/12/11, 51 y.o.   MRN: [id number]  HPI  Pt in with nasal congestion, colored mucous from nose when blows nose, sinus pain, and ear pain. Pain in ear worse on left side. Some dry cough for 3 days.  Some upper teeth discomfort.  Review of Systems  Constitutional: Negative for chills, fatigue and fever.  HENT: Positive for congestion, postnasal drip, sinus pain and sinus pressure.   Respiratory: Positive for cough. Negative for shortness of breath and wheezing.   Cardiovascular: Negative for chest pain and palpitations.  Gastrointestinal: Negative for abdominal pain.  Musculoskeletal: Negative for back pain and myalgias.  Skin: Negative for rash.  Neurological: Negative for numbness and headaches.  Hematological: Negative for adenopathy. Does not bruise/bleed easily.  Psychiatric/Behavioral: Negative for behavioral problems and confusion.    Past Medical History:  Diagnosis Date  . Allergy   . Asthma   . B12 deficiency 05/14/2015  . Depression   . GERD (gastroesophageal reflux disease)   . History of chicken pox   . Migraines   . OSA (obstructive sleep apnea) 03/2016     Social History   Social History  . Marital status: Married    Spouse name: N/A  . Number of children: N/A  . Years of education: N/A   Occupational History  . Not on file.   Social History Main Topics  . Smoking status: Never Smoker  . Smokeless tobacco: Never Used  . Alcohol use No  . Drug use: No  . Sexual activity: Not on file   Other Topics Concern  . 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    Has associates degree   Enjoys painting, animals, antiquing, shopping, reading, movies       Past Surgical History:  Procedure Laterality Date  . CESAREAN SECTION  1991  . CHOLECYSTECTOMY  2007  . eardrum repair - left Left   . INNER EAR SURGERY     Pt  states she had her "eardrum rebuilt". Has had 8 sets of tubes in ears through adolecense  . MYRINGOTOMY Bilateral    twice each.  . TONSILLECTOMY AND ADENOIDECTOMY  1976  . TUBAL LIGATION  2001    Family History  Problem Relation Age of Onset  . Hyperlipidemia Mother   . Heart disease Mother   . Hypertension Mother   . Depression Mother   . Parkinson's disease Father   . Depression Maternal Aunt   . Alcohol abuse Maternal Uncle   . Depression Maternal Grandmother   . Heart disease Maternal Grandfather   . Hypertension Maternal Grandfather   . Alcohol abuse Maternal Grandfather   . AVM Maternal Aunt     congenital  . Aneurysm Maternal Aunt     No Known Allergies  Current Outpatient Prescriptions on File Prior to Visit  Medication Sig Dispense Refill  . albuterol (PROAIR HFA) 108 (90 BASE) MCG/ACT inhaler Inhale 2 puffs into the lungs every 6 (six) hours as needed for wheezing or shortness of breath. 3.7 g 3  . betamethasone valerate ointment (VALISONE) 0.1 % Apply small amount to ear as needed for itching/scaling.    Marland Kitchen buPROPion (WELLBUTRIN XL) 300 MG 24 hr tablet TAKE 1 TABLET BY MOUTH ONCE DAILY 90 tablet 1  . cetirizine (ZYRTEC) 10 MG tablet Take 10 mg by  mouth daily.    . CYANOCOBALAMIN IJ Inject 1,000 mcg as directed every 30 (thirty) days.    . Ergocalciferol (VITAMIN D2) 2000 UNITS TABS Take 1 tablet by mouth daily. Reported on 03/22/2016    . esomeprazole (NEXIUM) 40 MG capsule TAKE 1 CAPSULE BY MOUTH DAILY AT 12 NOON. 90 capsule 3  . montelukast (SINGULAIR) 10 MG tablet TAKE 1 TABLET BY MOUTH AT BEDTIME 90 tablet 1  . MYRBETRIQ 25 MG TB24 tablet TAKE 1 TABLET BY MOUTH ONCE DAILY 90 tablet 0   Current Facility-Administered Medications on File Prior to Visit  Medication Dose Route Frequency Provider Last Rate Last Dose  . cyanocobalamin ((VITAMIN B-12)) injection 1,000 mcg  1,000 mcg Intramuscular Q30 days Debbrah Alar, NP   1,000 mcg at 07/26/16 1624    BP  121/76 (BP Location: Right Arm, Patient Position: Sitting, Cuff Size: Large)   Pulse (!) 118   Temp 98.1 F (36.7 C) (Oral)   Resp 16   Ht 5' (1.524 m)   Wt 162 lb 4 oz (73.6 kg)   LMP 03/23/2014   SpO2 98%   BMI 31.69 kg/m       Objective:   Physical Exam   General  Mental Status - Alert. General Appearance - Well groomed. Not in acute distress.  Skin Rashes- No Rashes.  HEENT Head- Normal. Ear Auditory Canal - Left- Normal. Right - Normal.Tympanic Membrane- Left- Normal. Right- Normal. Eye Sclera/Conjunctiva- Left- Normal. Right- Normal. Nose & Sinuses Nasal Mucosa- Left-  Boggy and Congested. Right-  Boggy and  Congested.Bilateral maxillary and frontal sinus pressure. Mouth & Throat Lips: Upper Lip- Normal: no dryness, cracking, pallor, cyanosis, or vesicular eruption. Lower Lip-Normal: no dryness, cracking, pallor, cyanosis or vesicular eruption. Buccal Mucosa- Bilateral- No Aphthous ulcers. Oropharynx- No Discharge or Erythema. Tonsils: Characteristics- Bilateral- No Erythema or Congestion. Size/Enlargement- Bilateral- No enlargement. Discharge- bilateral-None.  Neck Neck- Supple. No Masses.   Chest and Lung Exam Auscultation: Breath Sounds:-Clear even and unlabored.  Cardiovascular Auscultation:Rythm- Regular, rate and rhythm. Murmurs & Other Heart Sounds:Ausculatation of the heart reveal- No Murmurs.  Lymphatic Head & Neck General Head & Neck Lymphatics: Bilateral: Description- No Localized lymphadenopathy.      Assessment & Plan:  You appear to have a sinus infection. I am prescribing augmentin antibiotic for the infection. To help with the nasal congestion I prescribed flonase nasal steroid. For your associated cough, I prescribed cough medicine benzonatate  Rest, hydrate, tylenol for fever.  Pt requested depomedrol 40 mg injection for inflammation. So after discussion decided would give.   Follow up in 7 days or as needed.

## 2016-11-16 NOTE — Patient Instructions (Addendum)
You appear to have a sinus infection. I am prescribing augmentin antibiotic for the infection. To help with the nasal congestion I prescribed flonase nasal steroid. For your associated cough, I prescribed cough medicine benzonatate  Rest, hydrate, tylenol for fever.  Follow up in 7 days or as needed.  Pt requested depomedrol 40 mg injection for inflammation. So after discussion decided would give.   If clinically sinus pressure persists or worsens advised her rocephin IM option to add on treatment if needed.(she mentioned this today as well but at present time did not think indicated)

## 2016-11-26 MED FILL — MONTELUKAST SOD 10 MG TAB: 10 | 90 days supply | Qty: 90 | Fill #0

## 2016-11-26 MED FILL — MYRBETRIQ ER 25 MG TABLET: 25 | 30 days supply | Qty: 30 | Fill #1

## 2016-12-02 ENCOUNTER — Ambulatory Visit (INDEPENDENT_AMBULATORY_CARE_PROVIDER_SITE_OTHER): Payer: 59

## 2016-12-02 DIAGNOSIS — E538 Deficiency of other specified B group vitamins: Secondary | ICD-10-CM | POA: Diagnosis not present

## 2016-12-02 MED ORDER — CYANOCOBALAMIN 1000 MCG/ML IJ SOLN
1000.0000 ug | Freq: Once | INTRAMUSCULAR | Status: AC
  Administered 2016-12-02: 1000 ug via INTRAMUSCULAR

## 2016-12-02 NOTE — Progress Notes (Signed)
Pre visit review using our clinic tool,if applicable. No additional management support is needed unless otherwise documented below in the visit note.   Patient in for B 12 injection per order from [facility]. Patient has B12 deficiency.   Given IM Left Deltoid. Patient tolerated well.

## 2016-12-20 MED FILL — BUPROPION HCL XL 300 MG TAB: 300 | 90 days supply | Qty: 90 | Fill #1

## 2016-12-30 ENCOUNTER — Ambulatory Visit (INDEPENDENT_AMBULATORY_CARE_PROVIDER_SITE_OTHER): Payer: 59 | Admitting: Behavioral Health

## 2016-12-30 DIAGNOSIS — E538 Deficiency of other specified B group vitamins: Secondary | ICD-10-CM | POA: Diagnosis not present

## 2016-12-30 MED ORDER — CYANOCOBALAMIN 1000 MCG/ML IJ SOLN
1000.0000 ug | Freq: Once | INTRAMUSCULAR | Status: AC
  Administered 2016-12-30: 1000 ug via INTRAMUSCULAR

## 2016-12-30 NOTE — Progress Notes (Addendum)
Pre visit review using our clinic review tool, if applicable. No additional management support is needed unless otherwise documented below in the visit note.  Pt. came in for b12 injection and tolerated it well. Next appointment 02/02/2017.  Reviewed that pt was given b12 im injection. Saw deficiency b12 on problem list. Agree with administration of med.  Saguier, Percell Miller, PA-C

## 2017-01-10 ENCOUNTER — Other Ambulatory Visit: Payer: Self-pay | Admitting: Family

## 2017-01-10 MED FILL — MYRBETRIQ ER 25 MG TABLET: 25 | 90 days supply | Qty: 90 | Fill #0

## 2017-01-24 ENCOUNTER — Encounter: Payer: Self-pay | Admitting: Family

## 2017-01-24 ENCOUNTER — Ambulatory Visit: Payer: 59 | Admitting: Family

## 2017-01-24 ENCOUNTER — Ambulatory Visit (INDEPENDENT_AMBULATORY_CARE_PROVIDER_SITE_OTHER): Payer: 59 | Admitting: Family

## 2017-01-24 VITALS — BP 136/79 | HR 105 | Temp 98.0°F | Resp 18 | Ht 60.0 in | Wt 167.2 lb

## 2017-01-24 DIAGNOSIS — R635 Abnormal weight gain: Secondary | ICD-10-CM

## 2017-01-24 DIAGNOSIS — J45909 Unspecified asthma, uncomplicated: Secondary | ICD-10-CM

## 2017-01-24 DIAGNOSIS — F418 Other specified anxiety disorders: Secondary | ICD-10-CM | POA: Diagnosis not present

## 2017-01-24 DIAGNOSIS — E538 Deficiency of other specified B group vitamins: Secondary | ICD-10-CM | POA: Diagnosis not present

## 2017-01-24 DIAGNOSIS — N3281 Overactive bladder: Secondary | ICD-10-CM | POA: Diagnosis not present

## 2017-01-24 DIAGNOSIS — G4733 Obstructive sleep apnea (adult) (pediatric): Secondary | ICD-10-CM

## 2017-01-24 DIAGNOSIS — F419 Anxiety disorder, unspecified: Secondary | ICD-10-CM

## 2017-01-24 DIAGNOSIS — Z0000 Encounter for general adult medical examination without abnormal findings: Secondary | ICD-10-CM

## 2017-01-24 DIAGNOSIS — F329 Major depressive disorder, single episode, unspecified: Secondary | ICD-10-CM

## 2017-01-24 NOTE — Patient Instructions (Addendum)
Please contact [facility] to schedule your consultation for the screening colonoscopy at [phone]. Complete lab work prior to leaving.

## 2017-01-24 NOTE — Progress Notes (Signed)
Subjective:    Patient ID: Terri Johnson, female    DOB: [birth year], 51 y.o.   MRN: [id number]  HPI  [name] is a 51 yr old female who presents today for follow up.  1) Anxiety/Depression- mood is good. She is working on her bachelors so has been more stressed. She continues wellbutrin.   2) B12 deficiency- maintained on b12 shots monthly.  3) OSA- Stable on cpap.    4) OAB- using myrbetric. Helping.  Still has some occasional mild incontinence.  Wt Readings from Last 3 Encounters:  01/24/17 167 lb 3.2 oz (75.8 kg)  11/16/16 162 lb 4 oz (73.6 kg)  07/26/16 156 lb (70.8 kg)   5) Asthma- mild symptoms.  Continues singulair. Using albuterol 2-3 times a week in the AM.  Review of Systems Past Medical History:  Diagnosis Date  . Allergy   . Asthma   . B12 deficiency 05/14/2015  . Depression   . GERD (gastroesophageal reflux disease)   . History of chicken pox   . Migraines   . OSA (obstructive sleep apnea) 03/2016     Social History   Social History  . Marital status: Married    Spouse name: N/A  . Number of children: N/A  . Years of education: N/A   Occupational History  . Not on file.   Social History Main Topics  . Smoking status: Never Smoker  . Smokeless tobacco: Never Used  . Alcohol use No  . Drug use: No  . Sexual activity: Not on file   Other Topics Concern  . 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    Has associates degree   Enjoys painting, animals, antiquing, shopping, reading, movies       Past Surgical History:  Procedure Laterality Date  . CESAREAN SECTION  1991  . CHOLECYSTECTOMY  2007  . eardrum repair - left Left   . INNER EAR SURGERY     Pt states she had her "eardrum rebuilt". Has had 8 sets of tubes in ears through adolecense  . MYRINGOTOMY Bilateral    twice each.  . TONSILLECTOMY AND ADENOIDECTOMY  1976  . TUBAL LIGATION  2001    Family  History  Problem Relation Age of Onset  . Hyperlipidemia Mother   . Heart disease Mother   . Hypertension Mother   . Depression Mother   . Parkinson's disease Father   . Depression Maternal Aunt   . Alcohol abuse Maternal Uncle   . Depression Maternal Grandmother   . Heart disease Maternal Grandfather   . Hypertension Maternal Grandfather   . Alcohol abuse Maternal Grandfather   . AVM Maternal Aunt     congenital  . Aneurysm Maternal Aunt     Allergies  Allergen Reactions  . Aspirin Other (See Comments)    Gi/ringing ears  . Tetracycline Rash    Rash/throat swells    Current Outpatient Prescriptions on File Prior to Visit  Medication Sig Dispense Refill  . albuterol (PROAIR HFA) 108 (90 BASE) MCG/ACT inhaler Inhale 2 puffs into the lungs every 6 (six) hours as needed for wheezing or shortness of breath. 3.7 g 3  . betamethasone valerate ointment (VALISONE) 0.1 % Apply small amount to ear as needed for itching/scaling.    Marland Kitchen buPROPion (WELLBUTRIN XL) 300 MG 24 hr tablet TAKE 1 TABLET BY MOUTH ONCE DAILY 90 tablet  1  . cetirizine (ZYRTEC) 10 MG tablet Take 10 mg by mouth daily.    . CYANOCOBALAMIN IJ Inject 1,000 mcg as directed every 30 (thirty) days.    . Ergocalciferol (VITAMIN D2) 2000 UNITS TABS Take 1 tablet by mouth daily. Reported on 03/22/2016    . esomeprazole (NEXIUM) 40 MG capsule TAKE 1 CAPSULE BY MOUTH DAILY AT 12 NOON. 90 capsule 3  . fluticasone (FLONASE) 50 MCG/ACT nasal spray Place 2 sprays into both nostrils daily. 16 g 1  . montelukast (SINGULAIR) 10 MG tablet TAKE 1 TABLET BY MOUTH AT BEDTIME 90 tablet 1  . MYRBETRIQ 25 MG TB24 tablet TAKE 1 TABLET BY MOUTH ONCE DAILY 90 tablet 0   Current Facility-Administered Medications on File Prior to Visit  Medication Dose Route Frequency Provider Last Rate Last Dose  . cyanocobalamin ((VITAMIN B-12)) injection 1,000 mcg  1,000 mcg Intramuscular Q30 days Debbrah Alar, NP   1,000 mcg at 07/26/16 1624    BP 136/79  (BP Location: Right Arm, Cuff Size: Large)   Pulse (!) 105   Temp 98 F (36.7 C) (Oral)   Resp 18   Ht 5' (1.524 m)   Wt 167 lb 3.2 oz (75.8 kg)   LMP 03/23/2014   SpO2 100% Comment: room air  BMI 32.65 kg/m       Objective:   Physical Exam  Constitutional: She is oriented to person, place, and time. She appears well-developed and well-nourished.  HENT:  Head: Normocephalic and atraumatic.  Cardiovascular: Normal rate, regular rhythm and normal heart sounds.   No murmur heard. Pulmonary/Chest: Effort normal and breath sounds normal. No respiratory distress. She has no wheezes.  Musculoskeletal: She exhibits no edema.  Neurological: She is alert and oriented to person, place, and time.  Psychiatric: She has a normal mood and affect. Her behavior is normal. Judgment and thought content normal.          Assessment & Plan:

## 2017-01-24 NOTE — Progress Notes (Signed)
Pre visit review using our clinic review tool, if applicable. No additional management support is needed unless otherwise documented below in the visit note. 

## 2017-01-25 LAB — TSH: TSH: 1.92 u[IU]/mL (ref 0.35–4.50)

## 2017-01-26 DIAGNOSIS — G4733 Obstructive sleep apnea (adult) (pediatric): Secondary | ICD-10-CM | POA: Diagnosis not present

## 2017-01-26 DIAGNOSIS — J45901 Unspecified asthma with (acute) exacerbation: Secondary | ICD-10-CM | POA: Diagnosis not present

## 2017-01-27 NOTE — Assessment & Plan Note (Signed)
Stable, continue b12 injections.

## 2017-01-27 NOTE — Assessment & Plan Note (Signed)
Stable, continue wellbutrin  ?

## 2017-01-27 NOTE — Assessment & Plan Note (Signed)
Stable on myrbetriq, continue same.

## 2017-01-27 NOTE — Assessment & Plan Note (Signed)
Stable.  Continue singulair, albuterol prn. Advised pt to let me know if symptoms worsen as we may need to add inhaled corticosteroid.

## 2017-01-27 NOTE — Assessment & Plan Note (Signed)
Stable, continue cpap.  

## 2017-02-02 ENCOUNTER — Ambulatory Visit (INDEPENDENT_AMBULATORY_CARE_PROVIDER_SITE_OTHER): Payer: 59 | Admitting: Behavioral Health

## 2017-02-02 ENCOUNTER — Telehealth: Payer: Self-pay | Admitting: Medical

## 2017-02-02 DIAGNOSIS — E538 Deficiency of other specified B group vitamins: Secondary | ICD-10-CM

## 2017-02-02 MED ORDER — CYANOCOBALAMIN 1000 MCG/ML IJ SOLN
1000.0000 ug | Freq: Once | INTRAMUSCULAR | Status: AC
  Administered 2017-02-02: 1000 ug via INTRAMUSCULAR

## 2017-02-02 NOTE — Progress Notes (Addendum)
Pre visit review using our clinic review tool, if applicable. No additional management support is needed unless otherwise documented below in the visit note.  Patient came in office today for monthly B12 injection. IM injection given in left deltoid. Patient tolerated it well. Next appointment scheduled for 03/04/17 at 3:45 PM.  I have reviewed pt chart and agree with b12 injection. Hx of of b12 deficiency  Saguier, Percell Miller, [place]

## 2017-02-02 NOTE — Telephone Encounter (Signed)
Opened for review. 

## 2017-02-24 ENCOUNTER — Encounter: Payer: Self-pay | Admitting: Family

## 2017-03-04 ENCOUNTER — Ambulatory Visit: Payer: 59

## 2017-03-09 MED FILL — MONTELUKAST SOD 10 MG TAB: 10 | 90 days supply | Qty: 90 | Fill #1

## 2017-03-10 ENCOUNTER — Ambulatory Visit: Payer: 59

## 2017-03-16 ENCOUNTER — Ambulatory Visit (INDEPENDENT_AMBULATORY_CARE_PROVIDER_SITE_OTHER): Payer: 59

## 2017-03-16 DIAGNOSIS — E538 Deficiency of other specified B group vitamins: Secondary | ICD-10-CM

## 2017-03-16 MED ORDER — CYANOCOBALAMIN 1000 MCG/ML IJ SOLN
1000.0000 ug | Freq: Once | INTRAMUSCULAR | Status: AC
  Administered 2017-03-16: 1000 ug via INTRAMUSCULAR

## 2017-03-16 NOTE — Progress Notes (Signed)
Pre visit review using our clinic tool,if applicable. No additional management support is needed unless otherwise documented below in the visit note.   Patient in for B12 injection per order from M. Inda Castle, N.P. Due to patient having B12 deficiency.  Patient has no complaints this visit.  Given IM left deltoid. Patient tolerated well.  Return appointment scheduled.

## 2017-03-16 NOTE — Progress Notes (Signed)
Noted  

## 2017-03-29 ENCOUNTER — Other Ambulatory Visit: Payer: Self-pay | Admitting: Family

## 2017-03-29 MED FILL — BUPROPION HCL XL 300 MG TAB: 300 | 90 days supply | Qty: 90 | Fill #0

## 2017-03-29 NOTE — Telephone Encounter (Signed)
Pt is requesting Ventolin

## 2017-03-30 ENCOUNTER — Ambulatory Visit (INDEPENDENT_AMBULATORY_CARE_PROVIDER_SITE_OTHER): Payer: 59 | Admitting: Family Medicine

## 2017-03-30 ENCOUNTER — Encounter: Payer: Self-pay | Admitting: Family Medicine

## 2017-03-30 ENCOUNTER — Ambulatory Visit: Payer: 59 | Admitting: Family Medicine

## 2017-03-30 VITALS — BP 128/74 | HR 109 | Temp 98.1°F | Ht 60.0 in | Wt 171.2 lb

## 2017-03-30 DIAGNOSIS — J0100 Acute maxillary sinusitis, unspecified: Secondary | ICD-10-CM | POA: Diagnosis not present

## 2017-03-30 DIAGNOSIS — D649 Anemia, unspecified: Secondary | ICD-10-CM

## 2017-03-30 LAB — CBC WITH DIFFERENTIAL/PLATELET
BASOS PCT: 0.7 % (ref 0.0–3.0)
Basophils Absolute: 0.1 10*3/uL (ref 0.0–0.1)
Eosinophils Absolute: 0.2 10*3/uL (ref 0.0–0.7)
Eosinophils Relative: 2.1 % (ref 0.0–5.0)
HCT: 33.7 % — ABNORMAL LOW (ref 36.0–46.0)
Hemoglobin: 11 g/dL — ABNORMAL LOW (ref 12.0–15.0)
LYMPHS ABS: 2.8 10*3/uL (ref 0.7–4.0)
Lymphocytes Relative: 37.1 % (ref 12.0–46.0)
MCHC: 32.7 g/dL (ref 30.0–36.0)
MCV: 90.1 fl (ref 78.0–100.0)
MONO ABS: 0.5 10*3/uL (ref 0.1–1.0)
Monocytes Relative: 6.2 % (ref 3.0–12.0)
NEUTROS PCT: 53.9 % (ref 43.0–77.0)
Neutro Abs: 4.1 10*3/uL (ref 1.4–7.7)
PLATELETS: 351 10*3/uL (ref 150.0–400.0)
RBC: 3.74 Mil/uL — ABNORMAL LOW (ref 3.87–5.11)
RDW: 14.8 % (ref 11.5–15.5)
WBC: 7.6 10*3/uL (ref 4.0–10.5)

## 2017-03-30 LAB — IBC PANEL
Iron: 52 ug/dL (ref 42–145)
SATURATION RATIOS: 10.8 % — AB (ref 20.0–50.0)
Transferrin: 344 mg/dL (ref 212.0–360.0)

## 2017-03-30 LAB — FERRITIN: Ferritin: 20.1 ng/mL (ref 10.0–291.0)

## 2017-03-30 MED ORDER — AMOXICILLIN-POT CLAVULANATE 875-125 MG PO TABS: 1.0000 | ORAL_TABLET | Freq: Two times a day (BID) | ORAL | 0 refills | Status: DC

## 2017-03-30 MED FILL — AMOX-CLAV 875-125 MG TABLET: 875-125 | 10 days supply | Qty: 20 | Fill #0

## 2017-03-30 NOTE — Progress Notes (Signed)
Chief Complaint  Patient presents with  . Sinus Problem    pressure, drainage,cough(yellow).sxs started last Mon    Terri Johnson here for URI complaints.  Duration: 9 days  Associated symptoms: sinus congestion, sinus pain, sore throat, dental pain and cough Denies: subjective fever, rhinorrhea, itchy watery eyes, ear pain, ear drainage, shortness of breath, myalgia and rigors Treatment to date: Zyrtec D and Singulair Sick contacts: Yes  Patient has a history of tachycardia. She is found to be anemic with B12 deficiency. She has been receiving replacement over the past year. She had her iron checked in 2016. She had a referral placed for gastroenterology that she needs to get in touch with. Denies any areas of easy bruising or bleeding.  ROS:  Const: Denies fevers HEENT: As noted in HPI Lungs: No SOB  Past Medical History:  Diagnosis Date  . Allergy   . Asthma   . B12 deficiency 05/14/2015  . Depression   . GERD (gastroesophageal reflux disease)   . History of chicken pox   . Migraines   . OSA (obstructive sleep apnea) 03/2016   Family History  Problem Relation Age of Onset  . Hyperlipidemia Mother   . Heart disease Mother   . Hypertension Mother   . Depression Mother   . Parkinson's disease Father   . Depression Maternal Aunt   . Alcohol abuse Maternal Uncle   . Depression Maternal Grandmother   . Heart disease Maternal Grandfather   . Hypertension Maternal Grandfather   . Alcohol abuse Maternal Grandfather   . AVM Maternal Aunt        congenital  . Aneurysm Maternal Aunt     BP 128/74 (BP Location: Left Arm, Patient Position: Sitting, Cuff Size: Normal)   Pulse (!) 109   Temp 98.1 F (36.7 C) (Oral)   Ht 5' (1.524 m)   Wt 171 lb 3.5 oz (77.7 kg)   LMP 03/23/2014   SpO2 98%   BMI 33.44 kg/m  General: Awake, alert, appears stated age 51: AT, Terri Johnson, ears patent b/l and TM's neg, nares patent w/o discharge, max sinuses TTP, worse on L, pharynx pink and  without exudates, MMM Neck: No masses or asymmetry Heart: Tachycardic, reg rhythm, no murmurs, no bruits Lungs: CTAB, no accessory muscle use Psych: Age appropriate judgment and insight, normal mood and affect  Acute maxillary sinusitis, recurrence not specified - Plan: amoxicillin-clavulanate (AUGMENTIN) 875-125 MG tablet  Normocytic anemia - Plan: Ferritin, IBC panel, CBC w/Diff  Orders as above. Continue to push fluids, practice good hand hygiene, cover mouth when coughing. Vit B 12 def noted, will rule out Fe def also and obtain smear. Fe only checked in past, add above for completeness. Encouraged her to follow through with colon cancer screening which she voiced she plans to do. F/u prn. If starting to experience fevers, shaking, or shortness of breath, seek immediate care. Pt voiced understanding and agreement to the plan.  [place], DO 03/30/17 11:51 AM

## 2017-03-30 NOTE — Patient Instructions (Addendum)
Continue to push fluids, practice good hand hygiene, and cover your mouth if you cough.  If you start having fevers, shaking or shortness of breath, seek immediate care.  Give [place] 2-3 business days to get the results of your labs back.

## 2017-03-31 MED FILL — VENTOLIN HFA 90 MCG INHALER: 108 (90 BAS | 16 days supply | Qty: 18 | Fill #0

## 2017-04-11 ENCOUNTER — Ambulatory Visit (INDEPENDENT_AMBULATORY_CARE_PROVIDER_SITE_OTHER): Payer: 59 | Admitting: Family Medicine

## 2017-04-11 VITALS — BP 122/68 | HR 104 | Temp 98.0°F | Resp 16 | Wt 169.8 lb

## 2017-04-11 DIAGNOSIS — J302 Other seasonal allergic rhinitis: Secondary | ICD-10-CM | POA: Diagnosis not present

## 2017-04-11 DIAGNOSIS — H6983 Other specified disorders of Eustachian tube, bilateral: Secondary | ICD-10-CM | POA: Diagnosis not present

## 2017-04-11 MED ORDER — LEVOCETIRIZINE DIHYDROCHLORIDE 5 MG PO TABS: 5.0000 mg | ORAL_TABLET | Freq: Every evening | ORAL | 5 refills | Status: DC

## 2017-04-11 MED ORDER — PREDNISONE 20 MG PO TABS: 40.0000 mg | ORAL_TABLET | Freq: Every day | ORAL | 0 refills | Status: AC

## 2017-04-11 MED FILL — predniSONE 20 MG TABS: 20 | 5 days supply | Qty: 10 | Fill #0

## 2017-04-11 MED FILL — LEVOCETIRIZINE 5 MG TABLET: 5 | 30 days supply | Qty: 30 | Fill #0

## 2017-04-11 NOTE — Patient Instructions (Signed)
Keep pushing fluids.  Ok to return to work.  Let us know if you need anything.

## 2017-04-11 NOTE — Progress Notes (Signed)
Chief Complaint  Patient presents with  . Sinus Problem    Stuffy nose,coughing facial pressure.     Terri Johnson here for URI complaints.  Duration: 2 days got worse Associated symptoms: sinus congestion, sinus pain, rhinorrhea, itchy watery eyes, ear pain, sore throat and dry cough Denies: ear drainage, shortness of breath, myalgia and fevers/rigors Treatment to date: Augmentin for 10 days, Flonase, Zyrtec Sick contacts: No  ROS:  Const: Denies fevers HEENT: As noted in HPI Lungs: No SOB  Past Medical History:  Diagnosis Date  . Allergy   . Asthma   . B12 deficiency 05/14/2015  . Depression   . GERD (gastroesophageal reflux disease)   . History of chicken pox   . Migraines   . OSA (obstructive sleep apnea) 03/2016   Family History  Problem Relation Age of Onset  . Hyperlipidemia Mother   . Heart disease Mother   . Hypertension Mother   . Depression Mother   . Parkinson's disease Father   . Depression Maternal Aunt   . Alcohol abuse Maternal Uncle   . Depression Maternal Grandmother   . Heart disease Maternal Grandfather   . Hypertension Maternal Grandfather   . Alcohol abuse Maternal Grandfather   . AVM Maternal Aunt        congenital  . Aneurysm Maternal Aunt     BP 122/68 (BP Location: Left Arm, Patient Position: Sitting, Cuff Size: Normal)   Pulse (!) 104   Temp 98 F (36.7 C) (Oral)   Resp 16   Wt 169 lb 12.8 oz (77 kg)   LMP 03/23/2014   SpO2 98%   BMI 33.16 kg/m  General: Awake, alert, appears stated age 89: AT, [place], ears patent b/l and TM's retracted b/l, no fluid or erythema, nares patent w/o discharge, frontal sinuses are still TTP, pharynx pink and without exudates, MMM Neck: No masses or asymmetry Heart: RRR, no murmurs, no bruits Lungs: CTAB, no accessory muscle use Psych: Age appropriate judgment and insight, normal mood and affect  Seasonal allergic rhinitis, unspecified trigger - Plan: levocetirizine (XYZAL) 5 MG tablet  Dysfunction  of both eustachian tubes - Plan: predniSONE (DELTASONE) 20 MG tablet  Orders as above. Sounds more like allergies. Exchange zyrtec for Xyzal. PO steroid for ETD. Cont INCS. Continue to push fluids, practice good hand hygiene, cover mouth when coughing. F/u prn. If starting to experience fevers, shaking, or shortness of breath, seek immediate care. Pt voiced understanding and agreement to the plan.  [place], DO 04/11/17 3:41 PM

## 2017-04-15 ENCOUNTER — Ambulatory Visit (INDEPENDENT_AMBULATORY_CARE_PROVIDER_SITE_OTHER): Payer: 59

## 2017-04-15 DIAGNOSIS — E538 Deficiency of other specified B group vitamins: Secondary | ICD-10-CM | POA: Diagnosis not present

## 2017-04-15 MED ORDER — CYANOCOBALAMIN 1000 MCG/ML IJ SOLN
1000.0000 ug | Freq: Once | INTRAMUSCULAR | Status: AC
  Administered 2017-04-15: 1000 ug via INTRAMUSCULAR

## 2017-04-15 NOTE — Progress Notes (Signed)
Pre visit review using our clinic tool,if applicable. No additional management support is needed unless otherwise documented below in the visit note.   Patient in for B12 injection per order from M. Edwena Blow ,NP due to patient having B12 deficiency.   Given 1000 mcg IM left deltoid. Patient tolerated well. No complaints voiced. Appointment scheduled for 1 month.

## 2017-05-13 ENCOUNTER — Ambulatory Visit (INDEPENDENT_AMBULATORY_CARE_PROVIDER_SITE_OTHER): Payer: 59

## 2017-05-13 DIAGNOSIS — E538 Deficiency of other specified B group vitamins: Secondary | ICD-10-CM | POA: Diagnosis not present

## 2017-05-13 MED ORDER — CYANOCOBALAMIN 1000 MCG/ML IJ SOLN
1000.0000 ug | Freq: Once | INTRAMUSCULAR | Status: AC
  Administered 2017-05-13: 1000 ug via INTRAMUSCULAR

## 2017-05-13 NOTE — Progress Notes (Signed)
Pre visit review using our clinic tool,if applicable. No additional management support is needed unless otherwise documented below in the visit note.   Patient in for B12 injection per order from [place]. No complaints voiced.   Given 1000 mcg  IM Right deltoid. Patient tolerated well.  Return appointment given to patient.

## 2017-05-16 DIAGNOSIS — H6123 Impacted cerumen, bilateral: Secondary | ICD-10-CM | POA: Diagnosis not present

## 2017-05-16 DIAGNOSIS — H60543 Acute eczematoid otitis externa, bilateral: Secondary | ICD-10-CM | POA: Diagnosis not present

## 2017-05-16 DIAGNOSIS — H9203 Otalgia, bilateral: Secondary | ICD-10-CM | POA: Diagnosis not present

## 2017-05-16 DIAGNOSIS — H906 Mixed conductive and sensorineural hearing loss, bilateral: Secondary | ICD-10-CM | POA: Diagnosis not present

## 2017-05-16 MED FILL — BETAMETHASONE VALER 0.1% OI: 0.1 | 30 days supply | Qty: 30 | Fill #0

## 2017-06-01 ENCOUNTER — Encounter: Payer: Self-pay | Admitting: Acute Care

## 2017-06-01 ENCOUNTER — Ambulatory Visit (INDEPENDENT_AMBULATORY_CARE_PROVIDER_SITE_OTHER): Payer: 59 | Admitting: Acute Care

## 2017-06-01 VITALS — BP 122/80 | HR 100 | Ht 59.0 in | Wt 172.8 lb

## 2017-06-01 DIAGNOSIS — G4733 Obstructive sleep apnea (adult) (pediatric): Secondary | ICD-10-CM | POA: Diagnosis not present

## 2017-06-01 DIAGNOSIS — J45909 Unspecified asthma, uncomplicated: Secondary | ICD-10-CM

## 2017-06-01 NOTE — Patient Instructions (Addendum)
It is good to see you today. You are clearly benefiting from treatment We will place an  order new supplies and equipment . Continue on CPAP at bedtime. You appear to be benefiting from the treatment Goal is to wear for at least 4-6 hours each night for maximal clinical benefit. Continue to work on weight loss, as the link between excess weight  and sleep apnea is well established.  Do not drive if sleepy. Wash mask, tubing reservoir and filter once weekly with soapy water Follow up with 12 months with Dr. Halford Chessman  Or Eric Form, NP. Please contact office for sooner follow up if symptoms do not improve or worsen or seek emergency care

## 2017-06-01 NOTE — Assessment & Plan Note (Signed)
Currently stable Continue regimen of Singulair and albuterol as needed.

## 2017-06-01 NOTE — Progress Notes (Signed)
History of Present Illness Terri Johnson is a 51 y.o. female with OSA. She is followed by Dr. Corrie Dandy   06/01/2017 Annual Follow Up: Pt presents for annual follow up of CPAP use.She states she has no issues with her CPAP. She has gone back to school for her degree which she states has only been possible since starting her CPAP. She has less daytime sleepiness. She is more focused and alert. She has been able to come off Topamax and an anti depressant since starting her CPAP therapy. She denies any headaches. No more sleepiness while driving.She states her head gear is too lose, otherwise no other problems with equipment.She has just recently moved to a new home, and that threw her off her schedule. Additionally she has had the Flu, and that is the reason she missed afew nights of CPAP. She states normally compliances 100%. She denies fever, chest pain, orthopnea, or hemoptysis.   Test Results: Download 05/02/2017 through 05/31/2017 Air Sense 10 AutoSet AutoSet 5-15 cm of H2O Usage days 23/30 or 77% Greater than 4 hours equals 23 days or 77% Average usage 5 hours and 30 minutes Average pressure 8.5 cm H2O AHI equals 3.4 ( central equals 0.1, obstructive equals 2.4, unknown equals 0.3)   CBC Latest Ref Rng & Units 03/30/2017 05/06/2016 08/04/2015  WBC 4.0 - 10.5 K/uL 7.6 6.4 6.2  Hemoglobin 12.0 - 15.0 g/dL 11.0(L) 11.1(L) 11.0(L)  Hematocrit 36.0 - 46.0 % 33.7(L) 33.7(L) 33.1(L)  Platelets 150.0 - 400.0 K/uL 351.0 287.0 313.0    BMP Latest Ref Rng & Units 05/06/2016 03/23/2016 05/05/2015  Glucose 70 - 99 mg/dL 73 86 72  BUN 6 - 23 mg/dL 15 17 11   Creatinine 0.40 - 1.20 mg/dL 0.91 0.80 0.93  Sodium 135 - 145 mEq/L 139 138 138  Potassium 3.5 - 5.1 mEq/L 3.8 4.0 3.7  Chloride 96 - 112 mEq/L 108 107 108  CO2 19 - 32 mEq/L 23 25 22   Calcium 8.4 - 10.5 mg/dL 9.6 9.2 9.1     Past medical hx Past Medical History:  Diagnosis Date  . Allergy   . Asthma   . B12 deficiency 05/14/2015  .  Depression   . GERD (gastroesophageal reflux disease)   . History of chicken pox   . Migraines   . OSA (obstructive sleep apnea) 03/2016     Social History  Substance Use Topics  . Smoking status: Never Smoker  . Smokeless tobacco: Never Used  . Alcohol use No    Ms.Keiffer reports that she has never smoked. She has never used smokeless tobacco. She reports that she does not drink alcohol or use drugs.  Tobacco Cessation: Patient is a never smoker  Past surgical hx, Family hx, Social hx all reviewed.  Current Outpatient Prescriptions on File Prior to Visit  Medication Sig  . betamethasone valerate ointment (VALISONE) 0.1 % Apply small amount to ear as needed for itching/scaling.  Marland Kitchen buPROPion (WELLBUTRIN XL) 300 MG 24 hr tablet TAKE 1 TABLET BY MOUTH ONCE DAILY  . CYANOCOBALAMIN IJ Inject 1,000 mcg as directed every 30 (thirty) days.  . Ergocalciferol (VITAMIN D2) 2000 UNITS TABS Take 1 tablet by mouth daily. Reported on 03/22/2016  . esomeprazole (NEXIUM) 40 MG capsule TAKE 1 CAPSULE BY MOUTH DAILY AT 12 NOON.  . fluticasone (FLONASE) 50 MCG/ACT nasal spray Place 2 sprays into both nostrils daily.  Marland Kitchen levocetirizine (XYZAL) 5 MG tablet Take 1 tablet (5 mg total) by mouth every evening.  Marland Kitchen  montelukast (SINGULAIR) 10 MG tablet TAKE 1 TABLET BY MOUTH AT BEDTIME  . MYRBETRIQ 25 MG TB24 tablet TAKE 1 TABLET BY MOUTH ONCE DAILY  . VENTOLIN HFA 108 (90 Base) MCG/ACT inhaler INHALE 2 PUFFS INTO THE LUNGS EVERY 6 HOURS AS NEEDED FOR WHEEZING OR SHORTNESS OF BREATH.   Current Facility-Administered Medications on File Prior to Visit  Medication  . cyanocobalamin ((VITAMIN B-12)) injection 1,000 mcg     Allergies  Allergen Reactions  . Aspirin Other (See Comments)    Gi/ringing ears  . Tetracycline Rash    Rash/throat swells    Review Of Systems:  Constitutional:   No  weight loss, night sweats,  Fevers, chills, fatigue, or  lassitude.  HEENT:   No headaches,  Difficulty  swallowing,  Tooth/dental problems, or  Sore throat,                No sneezing, itching, ear ache, nasal congestion, post nasal drip,   CV:  No chest pain,  Orthopnea, PND, swelling in lower extremities, anasarca, dizziness, palpitations, syncope.   GI  No heartburn, indigestion, abdominal pain, nausea, vomiting, diarrhea, change in bowel habits, loss of appetite, bloody stools.   Resp: No shortness of breath with exertion or at rest.  No excess mucus, no productive cough,  No non-productive cough,  No coughing up of blood.  No change in color of mucus.  No wheezing.  No chest wall deformity  Skin: no rash or lesions.  GU: no dysuria, change in color of urine, no urgency or frequency.  No flank pain, no hematuria   MS:  No joint pain or swelling.  No decreased range of motion.  No back pain.  Psych:  No change in mood or affect. No depression or anxiety.  No memory loss.   Vital Signs BP 122/80 (BP Location: Left Arm, Patient Position: Sitting, Cuff Size: Normal)   Pulse 100   Ht 4\' 11"  (1.499 m)   Wt 172 lb 12.8 oz (78.4 kg)   LMP 03/23/2014   SpO2 97%   BMI 34.90 kg/m    Physical Exam:  General- No distress,  A&Ox3, pleasant ENT: No sinus tenderness, TM clear, pale nasal mucosa, no oral exudate,no post nasal drip, no LAN Cardiac: S1, S2, regular rate and rhythm, no murmur Chest: No wheeze/ rales/ dullness; no accessory muscle use, no nasal flaring, no sternal retractions Abd.: Soft Non-tender, nondistended, bowel sounds positive, obese Ext: No clubbing cyanosis, edema Neuro:  normal strength Skin: No rashes, warm and dry Psych: normal mood and behavior   Assessment/Plan  OSA (obstructive sleep apnea) Compliant with CPAP treatment Benefiting from therapy Plan You are clearly benefiting from treatment We will place an  order new supplies and equipment . Continue on CPAP at bedtime. You appear to be benefiting from the treatment Goal is to wear for at least 4-6  hours each night for maximal clinical benefit. Continue to work on weight loss, as the link between excess weight  and sleep apnea is well established.  Do not drive if sleepy. Wash mask, tubing reservoir and filter once weekly with soapy water Follow up with 12 months with Dr. Halford Chessman  Or Eric Form, NP. Please contact office for sooner follow up if symptoms do not improve or worsen or seek emergency care      Asthma Currently stable Continue regimen of Singulair and albuterol as needed.    Magdalen Spatz, NP 06/01/2017  9:27 PM

## 2017-06-01 NOTE — Assessment & Plan Note (Signed)
Compliant with CPAP treatment Benefiting from therapy Plan You are clearly benefiting from treatment We will place an  order new supplies and equipment . Continue on CPAP at bedtime. You appear to be benefiting from the treatment Goal is to wear for at least 4-6 hours each night for maximal clinical benefit. Continue to work on weight loss, as the link between excess weight  and sleep apnea is well established.  Do not drive if sleepy. Wash mask, tubing reservoir and filter once weekly with soapy water Follow up with 12 months with Dr. Halford Chessman  Or Eric Form, NP. Please contact office for sooner follow up if symptoms do not improve or worsen or seek emergency care

## 2017-06-05 NOTE — Progress Notes (Signed)
I have reviewed and agree with assessment/plan.  Chesley Mires, MD [facility] Pulmonary/Critical Care 06/05/2017, 10:57 AM Pager:  [phone]

## 2017-06-14 ENCOUNTER — Ambulatory Visit: Payer: 59

## 2017-06-17 ENCOUNTER — Ambulatory Visit (INDEPENDENT_AMBULATORY_CARE_PROVIDER_SITE_OTHER): Payer: 59

## 2017-06-17 DIAGNOSIS — E538 Deficiency of other specified B group vitamins: Secondary | ICD-10-CM | POA: Diagnosis not present

## 2017-06-17 MED ORDER — CYANOCOBALAMIN 1000 MCG/ML IJ SOLN
1000.0000 ug | Freq: Once | INTRAMUSCULAR | Status: AC
  Administered 2017-10-21: 1000 ug via INTRAMUSCULAR

## 2017-06-17 NOTE — Progress Notes (Signed)
Pre visit review using our clinic tool,if applicable. No additional management support is needed unless otherwise documented below in the visit note.   Patient in for B12 injection per order from M. Osullivan NP due to patient having B12 deficiency.  No complaints voiced this visit. Patient taking medications as ordered.    Patient given 1000 mcg IM Right deltoid muscle and tolerated well.  Return appointment scheduled for 1 month.

## 2017-06-20 NOTE — Progress Notes (Signed)
Noted  

## 2017-06-29 ENCOUNTER — Telehealth: Payer: Self-pay | Admitting: Family

## 2017-06-29 MED FILL — BUPROPION HCL XL 300 MG TAB: 300 | 90 days supply | Qty: 90 | Fill #1

## 2017-06-29 NOTE — Telephone Encounter (Signed)
Singulair refill sent to pharmacy. Pt last seen 01/2017 and has not future appts scheduled. When should pt follow up in the office?

## 2017-07-01 NOTE — Telephone Encounter (Signed)
10/18 please.

## 2017-07-04 NOTE — Telephone Encounter (Signed)
shiquita-- please call pt to schedule appt for routine 6 month follow up with Melissa in October. Thanks!

## 2017-07-04 NOTE — Telephone Encounter (Signed)
Pt has been scheduled.  °

## 2017-07-19 ENCOUNTER — Other Ambulatory Visit: Payer: Self-pay | Admitting: Family

## 2017-07-19 ENCOUNTER — Ambulatory Visit (INDEPENDENT_AMBULATORY_CARE_PROVIDER_SITE_OTHER): Payer: 59 | Admitting: Behavioral Health

## 2017-07-19 DIAGNOSIS — E538 Deficiency of other specified B group vitamins: Secondary | ICD-10-CM | POA: Diagnosis not present

## 2017-07-19 DIAGNOSIS — Z1231 Encounter for screening mammogram for malignant neoplasm of breast: Secondary | ICD-10-CM

## 2017-07-19 MED ORDER — CYANOCOBALAMIN 1000 MCG/ML IJ SOLN
1000.0000 ug | Freq: Once | INTRAMUSCULAR | Status: AC
  Administered 2017-07-19: 1000 ug via INTRAMUSCULAR

## 2017-07-19 NOTE — Progress Notes (Signed)
Pre visit review using our clinic review tool, if applicable. No additional management support is needed unless otherwise documented below in the visit note.  Patient came in clinic for monthly B12 injection. IM injection was given in the left deltoid. Patient tolerated it well. Next appointment 08/19/17 at 3:30 PM.

## 2017-07-20 NOTE — Progress Notes (Signed)
Reviewed

## 2017-07-22 ENCOUNTER — Other Ambulatory Visit: Payer: Self-pay | Admitting: Family

## 2017-07-28 ENCOUNTER — Ambulatory Visit (HOSPITAL_BASED_OUTPATIENT_CLINIC_OR_DEPARTMENT_OTHER): Payer: 59

## 2017-07-29 DIAGNOSIS — J45901 Unspecified asthma with (acute) exacerbation: Secondary | ICD-10-CM | POA: Diagnosis not present

## 2017-07-29 DIAGNOSIS — G4733 Obstructive sleep apnea (adult) (pediatric): Secondary | ICD-10-CM | POA: Diagnosis not present

## 2017-08-05 ENCOUNTER — Ambulatory Visit (INDEPENDENT_AMBULATORY_CARE_PROVIDER_SITE_OTHER): Payer: 59 | Admitting: Family

## 2017-08-05 ENCOUNTER — Encounter: Payer: Self-pay | Admitting: Family

## 2017-08-05 VITALS — BP 132/79 | HR 113 | Temp 98.2°F | Resp 18 | Ht 59.0 in | Wt 178.4 lb

## 2017-08-05 DIAGNOSIS — R635 Abnormal weight gain: Secondary | ICD-10-CM | POA: Diagnosis not present

## 2017-08-05 DIAGNOSIS — E538 Deficiency of other specified B group vitamins: Secondary | ICD-10-CM | POA: Diagnosis not present

## 2017-08-05 DIAGNOSIS — M8580 Other specified disorders of bone density and structure, unspecified site: Secondary | ICD-10-CM

## 2017-08-05 DIAGNOSIS — N3281 Overactive bladder: Secondary | ICD-10-CM

## 2017-08-05 DIAGNOSIS — F329 Major depressive disorder, single episode, unspecified: Secondary | ICD-10-CM | POA: Diagnosis not present

## 2017-08-05 DIAGNOSIS — F419 Anxiety disorder, unspecified: Secondary | ICD-10-CM | POA: Diagnosis not present

## 2017-08-05 DIAGNOSIS — G4733 Obstructive sleep apnea (adult) (pediatric): Secondary | ICD-10-CM

## 2017-08-05 DIAGNOSIS — J45909 Unspecified asthma, uncomplicated: Secondary | ICD-10-CM

## 2017-08-05 MED ORDER — MELOXICAM 7.5 MG PO TABS: 7.5000 mg | ORAL_TABLET | Freq: Every day | ORAL | 0 refills | Status: DC

## 2017-08-05 MED FILL — MELOXICAM 7.5 MG TABLET: 7.5 | 14 days supply | Qty: 14 | Fill #0

## 2017-08-05 NOTE — Patient Instructions (Signed)
You may begin meloxicam once daily for back pain.

## 2017-08-05 NOTE — Progress Notes (Addendum)
Subjective:    Patient ID: Terri Johnson, female    DOB: 07/28/1966, 51 y.o.   MRN: [id number]  HPI   Terri Johnson is a 51 yr old female who presents today for follow up of multiple medical problems.  1)Anxiety/depression- report mood is stable. Maintained on wellbutrin.   Wt Readings from Last 3 Encounters:  08/05/17 178 lb 6.4 oz (80.9 kg)  06/01/17 172 lb 12.8 oz (78.4 kg)  04/11/17 169 lb 12.8 oz (77 kg)   2) B12 deficiency-continues monthly b12 shots.    3) OSA- continues CPAP.  Reports good compliance and feels rested in AM.   4) OAB- continues myrbetric. Reports stable.   4) Asthma- maintained on singulair and prn albuterol. Reports some sinus congestion.  She has not needed her albuterol recently. Continues xyzyal.    5) Weight gain- reports that she is back in school. She is studying her bachelors in medical lab.  Exercises a few times a week.  Lab Results  Component Value Date   TSH 1.92 01/24/2017   Reports that she pulled something in her lower back about 2 weekends ago. Has tried ibuprofen with "a little improvement."  Pain radiates down the right buttock.   Review of Systems    see HPI  Past Medical History:  Diagnosis Date  . Allergy   . Asthma   . B12 deficiency 05/14/2015  . Depression   . GERD (gastroesophageal reflux disease)   . History of chicken pox   . Migraines   . OSA (obstructive sleep apnea) 03/2016     Social History   Social History  . Marital status: Married    Spouse name: N/A  . Number of children: N/A  . Years of education: N/A   Occupational History  . Not on file.   Social History Main Topics  . Smoking status: Never Smoker  . Smokeless tobacco: Never Used  . Alcohol use No  . Drug use: No  . Sexual activity: Not on file   Other Topics Concern  . 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    Has associates degree   Enjoys painting,  animals, antiquing, shopping, reading, movies       Past Surgical History:  Procedure Laterality Date  . CESAREAN SECTION  1991  . CHOLECYSTECTOMY  2007  . eardrum repair - left Left   . INNER EAR SURGERY     Pt states she had her "eardrum rebuilt". Has had 8 sets of tubes in ears through adolecense  . MYRINGOTOMY Bilateral    twice each.  . TONSILLECTOMY AND ADENOIDECTOMY  1976  . TUBAL LIGATION  2001    Family History  Problem Relation Age of Onset  . Hyperlipidemia Mother   . Heart disease Mother   . Hypertension Mother   . Depression Mother   . Parkinson's disease Father   . Depression Maternal Aunt   . Alcohol abuse Maternal Uncle   . Depression Maternal Grandmother   . Heart disease Maternal Grandfather   . Hypertension Maternal Grandfather   . Alcohol abuse Maternal Grandfather   . AVM Maternal Aunt        congenital  . Aneurysm Maternal Aunt     Allergies  Allergen Reactions  . Aspirin Other (See Comments)    Gi/ringing ears  . Tetracycline Rash    Rash/throat swells  Current Outpatient Prescriptions on File Prior to Visit  Medication Sig Dispense Refill  . betamethasone valerate ointment (VALISONE) 0.1 % Apply small amount to ear as needed for itching/scaling.    Marland Kitchen buPROPion (WELLBUTRIN XL) 300 MG 24 hr tablet TAKE 1 TABLET BY MOUTH ONCE DAILY 90 tablet 0  . CYANOCOBALAMIN IJ Inject 1,000 mcg as directed every 30 (thirty) days.    . Ergocalciferol (VITAMIN D2) 2000 UNITS TABS Take 1 tablet by mouth daily. Reported on 03/22/2016    . esomeprazole (NEXIUM) 40 MG capsule TAKE 1 CAPSULE BY MOUTH DAILY AT 12 NOON. 90 capsule 3  . fluticasone (FLONASE) 50 MCG/ACT nasal spray Place 2 sprays into both nostrils daily. 16 g 1  . levocetirizine (XYZAL) 5 MG tablet Take 1 tablet (5 mg total) by mouth every evening. 30 tablet 5  . montelukast (SINGULAIR) 10 MG tablet TAKE 1 TABLET BY MOUTH AT BEDTIME 90 tablet 1  . MYRBETRIQ 25 MG TB24 tablet TAKE 1 TABLET BY MOUTH  ONCE DAILY 90 tablet 0  . VENTOLIN HFA 108 (90 Base) MCG/ACT inhaler INHALE 2 PUFFS INTO THE LUNGS EVERY 6 HOURS AS NEEDED FOR WHEEZING OR SHORTNESS OF BREATH. 18 g 3   Current Facility-Administered Medications on File Prior to Visit  Medication Dose Route Frequency Provider Last Rate Last Dose  . cyanocobalamin ((VITAMIN B-12)) injection 1,000 mcg  1,000 mcg Intramuscular Once Debbrah Alar, NP        BP 132/79 (BP Location: Right Arm, Cuff Size: Large)   Pulse (!) 113   Temp 98.2 F (36.8 C) (Oral)   Resp 18   Ht 4\' 11"  (1.499 m)   Wt 178 lb 6.4 oz (80.9 kg)   LMP 03/23/2014   SpO2 100%   BMI 36.03 kg/m    Objective:   Physical Exam  Constitutional: She is oriented to person, place, and time. She appears well-developed and well-nourished.  HENT:  Head: Normocephalic and atraumatic.  Cardiovascular: Normal rate, regular rhythm and normal heart sounds.   No murmur heard. Pulmonary/Chest: Effort normal and breath sounds normal. No respiratory distress. She has no wheezes.  Musculoskeletal: She exhibits no edema.  Neurological: She is alert and oriented to person, place, and time.  Skin: Skin is warm and dry.  Psychiatric: She has a normal mood and affect. Her behavior is normal. Judgment and thought content normal.          Assessment & Plan:  Weight gain-  Eating poorly and not exercising.  Stressed the importance of healthy diet and regular exercise.  Low back pain with radiculopathy- advised pt on trial of meloxicam and to call if symptoms worsen or fail to improve.

## 2017-08-07 NOTE — Assessment & Plan Note (Signed)
Stable on myrbetriq

## 2017-08-07 NOTE — Assessment & Plan Note (Signed)
Clinically stable. Continue b12 shots.

## 2017-08-07 NOTE — Assessment & Plan Note (Signed)
Clinically stable.  Scored 8 on PHQ9 Continue wellbutrin.

## 2017-08-07 NOTE — Assessment & Plan Note (Signed)
Stable on singulair. Continue same.   

## 2017-08-07 NOTE — Assessment & Plan Note (Signed)
Stable on myrbetriq. continue same.

## 2017-08-08 MED FILL — MONTELUKAST SOD 10 MG TAB: 10 | 90 days supply | Qty: 90 | Fill #0

## 2017-08-18 ENCOUNTER — Encounter (HOSPITAL_BASED_OUTPATIENT_CLINIC_OR_DEPARTMENT_OTHER): Payer: Self-pay

## 2017-08-18 ENCOUNTER — Ambulatory Visit (HOSPITAL_BASED_OUTPATIENT_CLINIC_OR_DEPARTMENT_OTHER)
Admission: RE | Admit: 2017-08-18 | Discharge: 2017-08-18 | Disposition: A | Discharge status: Home / Self Care | Payer: 59 | Source: Ambulatory Visit | Attending: Family | Admitting: Family

## 2017-08-18 DIAGNOSIS — Z780 Asymptomatic menopausal state: Secondary | ICD-10-CM | POA: Insufficient documentation

## 2017-08-18 DIAGNOSIS — E2839 Other primary ovarian failure: Secondary | ICD-10-CM | POA: Diagnosis not present

## 2017-08-18 DIAGNOSIS — M8580 Other specified disorders of bone density and structure, unspecified site: Secondary | ICD-10-CM

## 2017-08-18 DIAGNOSIS — Z1231 Encounter for screening mammogram for malignant neoplasm of breast: Secondary | ICD-10-CM | POA: Diagnosis not present

## 2017-08-18 DIAGNOSIS — M85851 Other specified disorders of bone density and structure, right thigh: Secondary | ICD-10-CM | POA: Diagnosis not present

## 2017-08-19 ENCOUNTER — Ambulatory Visit (INDEPENDENT_AMBULATORY_CARE_PROVIDER_SITE_OTHER): Payer: 59 | Admitting: Behavioral Health

## 2017-08-19 DIAGNOSIS — E538 Deficiency of other specified B group vitamins: Secondary | ICD-10-CM

## 2017-08-19 MED ORDER — CYANOCOBALAMIN 1000 MCG/ML IJ SOLN
1000.0000 ug | Freq: Once | INTRAMUSCULAR | Status: AC
  Administered 2017-08-19: 1000 ug via INTRAMUSCULAR

## 2017-08-19 NOTE — Progress Notes (Signed)
Reviewed and agree.

## 2017-08-19 NOTE — Progress Notes (Signed)
Pre visit review using our clinic review tool, if applicable. No additional management support is needed unless otherwise documented below in the visit note.  Patient came in clinic for monthly B12 injection. IM injection was given in the left deltoid. Patient tolerated it well. Next appointment 09/21/17 at 3:30 PM.

## 2017-09-20 MED FILL — BUPROPION HCL XL 300 MG TAB: 300 | 90 days supply | Qty: 90 | Fill #0

## 2017-09-21 ENCOUNTER — Ambulatory Visit (INDEPENDENT_AMBULATORY_CARE_PROVIDER_SITE_OTHER): Payer: 59

## 2017-09-21 DIAGNOSIS — E538 Deficiency of other specified B group vitamins: Secondary | ICD-10-CM

## 2017-09-21 MED ORDER — CYANOCOBALAMIN 1000 MCG/ML IJ SOLN
1000.0000 ug | Freq: Once | INTRAMUSCULAR | Status: AC
  Administered 2017-09-21: 1000 ug via INTRAMUSCULAR

## 2017-09-21 NOTE — Progress Notes (Signed)
Pre visit review using our clinic tool,if applicable. No additional management support is needed unless otherwise documented below in the visit note.   Patient in for B12 injection per order from M. O'Sullivan,NP. Patient has B12 deficiency.  Given 1000 mcg IM left deltoid. Patient tolerated well. Return appointment scheduled for 1 month.

## 2017-10-03 DIAGNOSIS — N950 Postmenopausal bleeding: Secondary | ICD-10-CM | POA: Diagnosis not present

## 2017-10-05 ENCOUNTER — Encounter: Payer: Self-pay | Admitting: Family

## 2017-10-05 DIAGNOSIS — N950 Postmenopausal bleeding: Secondary | ICD-10-CM | POA: Diagnosis not present

## 2017-10-05 DIAGNOSIS — N882 Stricture and stenosis of cervix uteri: Secondary | ICD-10-CM | POA: Diagnosis not present

## 2017-10-05 DIAGNOSIS — N8500 Endometrial hyperplasia, unspecified: Secondary | ICD-10-CM | POA: Diagnosis not present

## 2017-10-21 ENCOUNTER — Ambulatory Visit (INDEPENDENT_AMBULATORY_CARE_PROVIDER_SITE_OTHER): Payer: 59

## 2017-10-21 DIAGNOSIS — E538 Deficiency of other specified B group vitamins: Secondary | ICD-10-CM

## 2017-10-21 MED ORDER — CYANOCOBALAMIN 1000 MCG/ML IJ SOLN: 1000.0000 ug | Freq: Once | INTRAMUSCULAR | Status: DC

## 2017-10-21 NOTE — Progress Notes (Signed)
Pre visit review using our clinic tool,if applicable. No additional management support is needed unless otherwise documented below in the visit note.   Patient in for b12 injection due to B12 deficiency per order from M. Edwena Blow, NP.  No complaints voiced this visit.  Given 1000 mcg IM Right deltoid. Patient tolerated wel.  Next appointment shceduled for 1 month.

## 2017-10-21 NOTE — Progress Notes (Signed)
Noted. Terri Johnson

## 2017-11-11 DIAGNOSIS — G4733 Obstructive sleep apnea (adult) (pediatric): Secondary | ICD-10-CM | POA: Diagnosis not present

## 2017-11-11 DIAGNOSIS — J45901 Unspecified asthma with (acute) exacerbation: Secondary | ICD-10-CM | POA: Diagnosis not present

## 2017-11-16 MED FILL — MONTELUKAST SOD 10 MG TAB: 10 | 90 days supply | Qty: 90 | Fill #1

## 2017-11-24 ENCOUNTER — Ambulatory Visit: Payer: 59

## 2017-11-24 DIAGNOSIS — E538 Deficiency of other specified B group vitamins: Secondary | ICD-10-CM

## 2017-11-24 MED ORDER — CYANOCOBALAMIN 1000 MCG/ML IJ SOLN: 1000.0000 ug | Freq: Once | INTRAMUSCULAR | Status: DC

## 2017-11-24 NOTE — Progress Notes (Signed)
Pt came in today for B12 injection. Received at 1550 in Right Deltoid without complications. Pt had no questions or concerns and states she will schedule her follow up at front desk on her way out.

## 2017-12-05 DIAGNOSIS — H524 Presbyopia: Secondary | ICD-10-CM | POA: Diagnosis not present

## 2017-12-05 DIAGNOSIS — D3131 Benign neoplasm of right choroid: Secondary | ICD-10-CM | POA: Diagnosis not present

## 2017-12-05 DIAGNOSIS — H5203 Hypermetropia, bilateral: Secondary | ICD-10-CM | POA: Diagnosis not present

## 2017-12-20 ENCOUNTER — Other Ambulatory Visit: Payer: Self-pay | Admitting: Family

## 2017-12-20 MED FILL — BUPROPION HCL XL 300 MG TAB: 300 | 90 days supply | Qty: 90 | Fill #0

## 2017-12-28 ENCOUNTER — Ambulatory Visit (INDEPENDENT_AMBULATORY_CARE_PROVIDER_SITE_OTHER): Payer: 59 | Admitting: *Deleted

## 2017-12-28 DIAGNOSIS — D519 Vitamin B12 deficiency anemia, unspecified: Secondary | ICD-10-CM | POA: Diagnosis not present

## 2017-12-28 MED ORDER — CYANOCOBALAMIN 1000 MCG/ML IJ SOLN: 1000.0000 ug | Freq: Once | INTRAMUSCULAR | Status: DC

## 2017-12-28 MED ORDER — CYANOCOBALAMIN 1000 MCG/ML IJ SOLN
1000.0000 ug | Freq: Once | INTRAMUSCULAR | Status: AC
  Administered 2017-12-28: 1000 ug via INTRAMUSCULAR

## 2017-12-28 NOTE — Progress Notes (Signed)
Pre visit review using our clinic review tool, if applicable. No additional management support is needed unless otherwise documented below in the visit note.  Pt here for monthly B12 injection.  B12 1052mcg given IM, left deltoid and pt tolerated injection well.  Next injection scheduled for 01/27/18 at 3:30pm.  Pt scheduled physical with PCP for 01/09/18 at 3:40pm.

## 2017-12-28 NOTE — Progress Notes (Signed)
Reviewed and agree.  Issaiah Seabrooks S O'Sullivan NP 

## 2018-01-09 ENCOUNTER — Telehealth: Payer: Self-pay | Admitting: Family

## 2018-01-09 ENCOUNTER — Encounter: Payer: Self-pay | Admitting: Family

## 2018-01-09 ENCOUNTER — Ambulatory Visit (INDEPENDENT_AMBULATORY_CARE_PROVIDER_SITE_OTHER): Payer: 59 | Admitting: Family

## 2018-01-09 VITALS — BP 122/54 | HR 116 | Temp 99.3°F | Resp 16

## 2018-01-09 DIAGNOSIS — K219 Gastro-esophageal reflux disease without esophagitis: Secondary | ICD-10-CM | POA: Diagnosis not present

## 2018-01-09 DIAGNOSIS — Z0000 Encounter for general adult medical examination without abnormal findings: Secondary | ICD-10-CM | POA: Diagnosis not present

## 2018-01-09 MED ORDER — PANTOPRAZOLE SODIUM 40 MG PO TBEC: 40.0000 mg | DELAYED_RELEASE_TABLET | Freq: Every day | ORAL | 5 refills | Status: DC

## 2018-01-09 MED FILL — PANTOPRAZOLE SOD DR 40 MG T: 40 | 30 days supply | Qty: 30 | Fill #0

## 2018-01-09 NOTE — Patient Instructions (Signed)
Please complete lab work prior to leaving.   Continue to work on [employer], exercise and weight loss.  Stop nexium,start protonix.

## 2018-01-09 NOTE — Telephone Encounter (Signed)
Pt is interested in cologard, could you please initiate?

## 2018-01-09 NOTE — Progress Notes (Signed)
Subjective:    Patient ID: Terri Johnson, female    DOB: Jun 28, 1966, 52 y.o.   MRN: [id number]  HPI  Patient presents today for complete physical.  Immunizations: tetanus 2012 Diet: Exercise: Colonoscopy: not on file Dexa: 08/18/17 Pap Smear: 06/24/15, (Dr. Gertie Fey)  Mammogram: 08/18/17  GERD- reports worsening GERD symptoms, currently on nexium.     Review of Systems    see HPI  Past Medical History:  Diagnosis Date  . Allergy   . Asthma   . B12 deficiency 05/14/2015  . Depression   . GERD (gastroesophageal reflux disease)   . History of chicken pox   . Migraines   . OSA (obstructive sleep apnea) 03/2016     Social History   Socioeconomic History  . Marital status: Married    Spouse name: Not on file  . Number of children: Not on file  . Years of education: Not on file  . Highest education level: Not on file  Occupational History  . Not on file  Social Needs  . Financial resource strain: Not on file  . Food insecurity:    Worry: Not on file    Inability: Not on file  . Transportation needs:    Medical: Not on file    Non-medical: Not on file  Tobacco Use  . Smoking status: Never Smoker  . Smokeless tobacco: Never Used  Substance and Sexual Activity  . Alcohol use: No    Alcohol/week: 0.0 oz  . Drug use: No  . Sexual activity: Not on file  Lifestyle  . Physical activity:    Days per week: Not on file    Minutes per session: Not on file  . Stress: Not on file  Relationships  . Social connections:    Talks on phone: Not on file    Gets together: Not on file    Attends religious service: Not on file    Active member of club or organization: Not on file    Attends meetings of clubs or organizations: Not on file    Relationship status: Not on file  . Intimate partner violence:    Fear of current or ex partner: Not on file    Emotionally abused: Not on file    Physically abused: Not on file    Forced sexual activity: Not on file  Other Topics  Concern  . 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    Has associates degree   Enjoys painting, animals, antiquing, shopping, reading, movies    Past Surgical History:  Procedure Laterality Date  . CESAREAN SECTION  1991  . CHOLECYSTECTOMY  2007  . eardrum repair - left Left   . INNER EAR SURGERY     Pt states she had her "eardrum rebuilt". Has had 8 sets of tubes in ears through adolecense  . MYRINGOTOMY Bilateral    twice each.  . TONSILLECTOMY AND ADENOIDECTOMY  1976  . TUBAL LIGATION  2001    Family History  Problem Relation Age of Onset  . Hyperlipidemia Mother   . Heart disease Mother   . Hypertension Mother   . Depression Mother   . Parkinson's disease Father   . Depression Maternal Grandmother   . Heart disease Maternal Grandfather   . Hypertension Maternal Grandfather   . Alcohol abuse Maternal Grandfather   . AVM Maternal Aunt  congenital  . Aneurysm Maternal Aunt   . Depression Maternal Aunt   . Alcohol abuse Maternal Uncle     Allergies  Allergen Reactions  . Aspirin Other (See Comments)    Gi/ringing ears  . Tetracycline Rash    Rash/throat swells    Current Outpatient Medications on File Prior to Visit  Medication Sig Dispense Refill  . betamethasone valerate ointment (VALISONE) 0.1 % Apply small amount to ear as needed for itching/scaling.    Marland Kitchen buPROPion (WELLBUTRIN XL) 300 MG 24 hr tablet TAKE 1 TABLET BY MOUTH ONCE DAILY 90 tablet 0  . CYANOCOBALAMIN IJ Inject 1,000 mcg as directed every 30 (thirty) days.    . Ergocalciferol (VITAMIN D2) 2000 UNITS TABS Take 1 tablet by mouth daily. Reported on 03/22/2016    . fluticasone (FLONASE) 50 MCG/ACT nasal spray Place 2 sprays into both nostrils daily. 16 g 1  . levocetirizine (XYZAL) 5 MG tablet Take 1 tablet (5 mg total) by mouth every evening. 30 tablet 5  . meloxicam (MOBIC) 7.5 MG tablet Take 1 tablet (7.5 mg total)  by mouth daily. 14 tablet 0  . montelukast (SINGULAIR) 10 MG tablet TAKE 1 TABLET BY MOUTH AT BEDTIME 90 tablet 1  . VENTOLIN HFA 108 (90 Base) MCG/ACT inhaler INHALE 2 PUFFS INTO THE LUNGS EVERY 6 HOURS AS NEEDED FOR WHEEZING OR SHORTNESS OF BREATH. 18 g 3  . MYRBETRIQ 25 MG TB24 tablet TAKE 1 TABLET BY MOUTH ONCE DAILY (Patient not taking: Reported on 01/09/2018) 90 tablet 0   No current facility-administered medications on file prior to visit.     BP (!) 122/54 (BP Location: Left Arm, Patient Position: Sitting, Cuff Size: Large)   Pulse (!) 116   Temp 99.3 F (37.4 C) (Oral)   Resp 16   LMP 03/23/2014   SpO2 99%    Objective:   Physical Exam  Physical Exam  Constitutional: She is oriented to person, place, and time. She appears well-developed and well-nourished. No distress.  HENT:  Head: Normocephalic and atraumatic.  Right Ear: Tympanic membrane and ear canal normal.  Left Ear: Tympanic membrane and ear canal normal.  Mouth/Throat: Oropharynx is clear and moist.  Eyes: Pupils are equal, round, and reactive to light. No scleral icterus.  Neck: Normal range of motion. No thyromegaly present.  Cardiovascular: Normal rate and regular rhythm.   No murmur heard. Pulmonary/Chest: Effort normal and breath sounds normal. No respiratory distress. He has no wheezes. She has no rales. She exhibits no tenderness.  Abdominal: Soft. Bowel sounds are normal. She exhibits no distension and no mass. There is no tenderness. There is no rebound and no guarding.  Musculoskeletal: She exhibits no edema.  Lymphadenopathy:    She has no cervical adenopathy.  Neurological: She is alert and oriented to person, place, and time. She has normal patellar reflexes. She exhibits normal muscle tone. Coordination normal.  Skin: Skin is warm and dry.  Psychiatric: She has a normal mood and affect. Her behavior is normal. Judgment and thought content normal.  Breasts: Examined lying Right: Without masses,  retractions, discharge or axillary adenopathy.  Left: Without masses, retractions, discharge or axillary adenopathy.  Pelvic: deferred     Assessment & Plan:         Assessment & Plan:  Preventative care- declines colo but is agreeable to cologard.  Will intiate.  EKG tracing is personally reviewed.  EKG notes NSR.  No acute changes. Machine notes atrial flutter, but this appears to  be overread- NSR on my assessment.   GERD- uncontrolled. D/c nexium, begin protonix.

## 2018-01-10 ENCOUNTER — Encounter: Payer: Self-pay | Admitting: Family

## 2018-01-10 LAB — HEPATIC FUNCTION PANEL
ALT: 40 U/L — AB (ref 0–35)
AST: 26 U/L (ref 0–37)
Albumin: 4.3 g/dL (ref 3.5–5.2)
Alkaline Phosphatase: 86 U/L (ref 39–117)
Bilirubin, Direct: 0.1 mg/dL (ref 0.0–0.3)
TOTAL PROTEIN: 7.1 g/dL (ref 6.0–8.3)
Total Bilirubin: 0.3 mg/dL (ref 0.2–1.2)

## 2018-01-10 LAB — CBC WITH DIFFERENTIAL/PLATELET
BASOS ABS: 0.1 10*3/uL (ref 0.0–0.1)
Basophils Relative: 1.2 % (ref 0.0–3.0)
EOS PCT: 2 % (ref 0.0–5.0)
Eosinophils Absolute: 0.2 10*3/uL (ref 0.0–0.7)
HEMATOCRIT: 34.1 % — AB (ref 36.0–46.0)
Hemoglobin: 11.4 g/dL — ABNORMAL LOW (ref 12.0–15.0)
LYMPHS PCT: 33.5 % (ref 12.0–46.0)
Lymphs Abs: 3.2 10*3/uL (ref 0.7–4.0)
MCHC: 33.6 g/dL (ref 30.0–36.0)
MCV: 92.5 fl (ref 78.0–100.0)
MONOS PCT: 6.7 % (ref 3.0–12.0)
Monocytes Absolute: 0.6 10*3/uL (ref 0.1–1.0)
NEUTROS ABS: 5.5 10*3/uL (ref 1.4–7.7)
Neutrophils Relative %: 56.6 % (ref 43.0–77.0)
Platelets: 306 10*3/uL (ref 150.0–400.0)
RBC: 3.68 Mil/uL — ABNORMAL LOW (ref 3.87–5.11)
RDW: 14.2 % (ref 11.5–15.5)
WBC: 9.7 10*3/uL (ref 4.0–10.5)

## 2018-01-10 LAB — BASIC METABOLIC PANEL
BUN: 19 mg/dL (ref 6–23)
CALCIUM: 9.9 mg/dL (ref 8.4–10.5)
CHLORIDE: 103 meq/L (ref 96–112)
CO2: 28 meq/L (ref 19–32)
CREATININE: 0.95 mg/dL (ref 0.40–1.20)
GFR: 65.79 mL/min (ref >60.00)
Glucose, Bld: 78 mg/dL (ref 70–99)
Potassium: 3.6 mEq/L (ref 3.5–5.1)
Sodium: 138 mEq/L (ref 135–145)

## 2018-01-10 LAB — URINALYSIS, ROUTINE W REFLEX MICROSCOPIC
BILIRUBIN URINE: NEGATIVE
HGB URINE DIPSTICK: NEGATIVE
KETONES UR: NEGATIVE
Leukocytes, UA: NEGATIVE
NITRITE: NEGATIVE
Specific Gravity, Urine: 1.025 (ref 1.000–1.030)
TOTAL PROTEIN, URINE-UPE24: NEGATIVE
URINE GLUCOSE: NEGATIVE
UROBILINOGEN UA: 0.2 (ref 0.0–1.0)
pH: 6 (ref 5.0–8.0)

## 2018-01-10 LAB — LIPID PANEL
CHOLESTEROL: 180 mg/dL (ref 0–200)
HDL: 55 mg/dL (ref >39.00)
LDL Cholesterol: 98 mg/dL (ref 0–99)
NonHDL: 125.06
TRIGLYCERIDES: 134 mg/dL (ref 0.0–149.0)
Total CHOL/HDL Ratio: 3
VLDL: 26.8 mg/dL (ref 0.0–40.0)

## 2018-01-10 LAB — TSH: TSH: 2.17 u[IU]/mL (ref 0.35–4.50)

## 2018-01-11 NOTE — Telephone Encounter (Signed)
Cologuard ordered through [employer]. Order confirmation: [id number].

## 2018-01-16 DIAGNOSIS — H60543 Acute eczematoid otitis externa, bilateral: Secondary | ICD-10-CM | POA: Diagnosis not present

## 2018-01-16 DIAGNOSIS — H6123 Impacted cerumen, bilateral: Secondary | ICD-10-CM | POA: Diagnosis not present

## 2018-01-16 DIAGNOSIS — H906 Mixed conductive and sensorineural hearing loss, bilateral: Secondary | ICD-10-CM | POA: Diagnosis not present

## 2018-01-16 DIAGNOSIS — H9203 Otalgia, bilateral: Secondary | ICD-10-CM | POA: Diagnosis not present

## 2018-01-27 ENCOUNTER — Ambulatory Visit (INDEPENDENT_AMBULATORY_CARE_PROVIDER_SITE_OTHER): Payer: 59

## 2018-01-27 DIAGNOSIS — E538 Deficiency of other specified B group vitamins: Secondary | ICD-10-CM

## 2018-01-27 MED ORDER — CYANOCOBALAMIN 1000 MCG/ML IJ SOLN
1000.0000 ug | Freq: Once | INTRAMUSCULAR | Status: AC
  Administered 2018-01-27: 1000 ug via INTRAMUSCULAR

## 2018-01-27 NOTE — Progress Notes (Signed)
Pre visit review using our clinic review tool, if applicable. No additional management support is needed unless otherwise documented below in the visit note.  Pt here today for B12 injection. 55mL injected into R deltoid. Pt tolerated injection well.   Next B12 injection in 4 weeks.   Nurse visit scheduled 02/28/2018.

## 2018-02-04 NOTE — Progress Notes (Signed)
Reviewed and agree.  Deyonna Fitzsimmons S O'Sullivan NP 

## 2018-02-16 ENCOUNTER — Other Ambulatory Visit: Payer: Self-pay | Admitting: Family

## 2018-02-17 MED FILL — MONTELUKAST SOD 10 MG TAB: 10 | 90 days supply | Qty: 90 | Fill #0

## 2018-02-28 ENCOUNTER — Ambulatory Visit (INDEPENDENT_AMBULATORY_CARE_PROVIDER_SITE_OTHER): Payer: 59 | Admitting: *Deleted

## 2018-02-28 DIAGNOSIS — E538 Deficiency of other specified B group vitamins: Secondary | ICD-10-CM

## 2018-02-28 MED ORDER — CYANOCOBALAMIN 1000 MCG/ML IJ SOLN
1000.0000 ug | Freq: Once | INTRAMUSCULAR | Status: AC
  Administered 2018-02-28: 1000 ug via INTRAMUSCULAR

## 2018-02-28 NOTE — Progress Notes (Addendum)
Pt here for B12 injection per Debbrah Alar, NP  B12 1056mcg given IM, left deltoid and pt tolerated injection well.  Next injection scheduled for 03/31/18 at 3:45pm.  Note routed to Dr Larose Kells in PCP's absence.  Kathlene November, MD

## 2018-03-01 DIAGNOSIS — Z1211 Encounter for screening for malignant neoplasm of colon: Secondary | ICD-10-CM | POA: Diagnosis not present

## 2018-03-01 LAB — COLOGUARD: COLOGUARD: NEGATIVE

## 2018-03-08 ENCOUNTER — Ambulatory Visit: Payer: 59 | Admitting: Family

## 2018-03-08 ENCOUNTER — Encounter: Payer: Self-pay | Admitting: Family

## 2018-03-08 VITALS — BP 138/82 | HR 101 | Temp 98.4°F | Resp 16 | Ht 59.0 in | Wt 182.2 lb

## 2018-03-08 DIAGNOSIS — M25551 Pain in right hip: Secondary | ICD-10-CM | POA: Diagnosis not present

## 2018-03-08 MED ORDER — MELOXICAM 7.5 MG PO TABS: 7.5000 mg | ORAL_TABLET | Freq: Every day | ORAL | 0 refills | Status: DC

## 2018-03-08 MED FILL — MELOXICAM 7.5 MG TABLET: 7.5 | 14 days supply | Qty: 14 | Fill #0

## 2018-03-08 NOTE — Progress Notes (Signed)
Subjective:    Patient ID: Terri Johnson, female    DOB: 09/20/1966, 52 y.o.   MRN: [id number]  HPI  Terri Johnson is a 52 yr old female who presents today with chief complaint of right hip pain.  Pain has been present x 1 year and has been worsening x 1 month.  Using ibuprofen and tylenol with only mild improvement.  Reports tenderness in the right buttock/hip area.  Sometimes radiates down to the right knee. No definite numbness/tingling. Denies RLE weakness.  Unable to sleep on her right side. Climbing stairs worsens the pain.    Review of Systems See HPI  Past Medical History:  Diagnosis Date  . Allergy   . Asthma   . B12 deficiency 05/14/2015  . Depression   . GERD (gastroesophageal reflux disease)   . History of chicken pox   . Migraines   . OSA (obstructive sleep apnea) 03/2016     Social History   Socioeconomic History  . Marital status: Married    Spouse name: Not on file  . Number of children: Not on file  . Years of education: Not on file  . Highest education level: Not on file  Occupational History  . Not on file  Social Needs  . Financial resource strain: Not on file  . Food insecurity:    Worry: Not on file    Inability: Not on file  . Transportation needs:    Medical: Not on file    Non-medical: Not on file  Tobacco Use  . Smoking status: Never Smoker  . Smokeless tobacco: Never Used  Substance and Sexual Activity  . Alcohol use: No    Alcohol/week: 0.0 oz  . Drug use: No  . Sexual activity: Not on file  Lifestyle  . Physical activity:    Days per week: Not on file    Minutes per session: Not on file  . Stress: Not on file  Relationships  . Social connections:    Talks on phone: Not on file    Gets together: Not on file    Attends religious service: Not on file    Active member of club or organization: Not on file    Attends meetings of clubs or organizations: Not on file    Relationship status: Not on file  . Intimate partner violence:   Fear of current or ex partner: Not on file    Emotionally abused: Not on file    Physically abused: Not on file    Forced sexual activity: Not on file  Other Topics Concern  . 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    Has associates degree   Enjoys painting, animals, antiquing, shopping, reading, movies    Past Surgical History:  Procedure Laterality Date  . CESAREAN SECTION  1991  . CHOLECYSTECTOMY  2007  . eardrum repair - left Left   . INNER EAR SURGERY     Pt states she had her "eardrum rebuilt". Has had 8 sets of tubes in ears through [employer]  . MYRINGOTOMY Bilateral    twice each.  . TONSILLECTOMY AND ADENOIDECTOMY  1976  . TUBAL LIGATION  2001    Family History  Problem Relation Age of Onset  . Hyperlipidemia Mother   . Heart disease Mother   . Hypertension Mother   . Depression Mother   . Parkinson's disease Father   .  Depression Maternal Grandmother   . Heart disease Maternal Grandfather   . Hypertension Maternal Grandfather   . Alcohol abuse Maternal Grandfather   . AVM Maternal Aunt        congenital  . Aneurysm Maternal Aunt   . Depression Maternal Aunt   . Alcohol abuse Maternal Uncle     Allergies  Allergen Reactions  . Aspirin Other (See Comments)    Gi/ringing ears  . Tetracycline Rash    Rash/throat swells    Current Outpatient Medications on File Prior to Visit  Medication Sig Dispense Refill  . betamethasone valerate ointment (VALISONE) 0.1 % Apply small amount to ear as needed for itching/scaling.    Marland Kitchen buPROPion (WELLBUTRIN XL) 300 MG 24 hr tablet TAKE 1 TABLET BY MOUTH ONCE DAILY 90 tablet 0  . CYANOCOBALAMIN IJ Inject 1,000 mcg as directed every 30 (thirty) days.    . Ergocalciferol (VITAMIN D2) 2000 UNITS TABS Take 1 tablet by mouth daily. Reported on 03/22/2016    . fluticasone (FLONASE) 50 MCG/ACT nasal spray Place 2 sprays into both nostrils daily. 16 g  1  . levocetirizine (XYZAL) 5 MG tablet Take 1 tablet (5 mg total) by mouth every evening. 30 tablet 5  . montelukast (SINGULAIR) 10 MG tablet TAKE 1 TABLET BY MOUTH AT BEDTIME 90 tablet 1  . pantoprazole (PROTONIX) 40 MG tablet Take 1 tablet (40 mg total) by mouth daily. 30 tablet 5  . VENTOLIN HFA 108 (90 Base) MCG/ACT inhaler INHALE 2 PUFFS INTO THE LUNGS EVERY 6 HOURS AS NEEDED FOR WHEEZING OR SHORTNESS OF BREATH. 18 g 3   No current facility-administered medications on file prior to visit.     BP 138/82 (BP Location: Right Arm, Cuff Size: Large)   Pulse (!) 101   Temp 98.4 F (36.9 C) (Oral)   Resp 16   Ht 4\' 11"  (1.499 m)   Wt 182 lb 3.2 oz (82.6 kg)   LMP 03/23/2014   SpO2 99%   BMI 36.80 kg/m       Objective:   Physical Exam  Constitutional: She appears well-developed and well-nourished.  Cardiovascular: Normal rate, regular rhythm and normal heart sounds.  No murmur heard. Pulmonary/Chest: Effort normal and breath sounds normal. No respiratory distress. She has no wheezes.  Neurological:  Reflex Scores:      Patellar reflexes are 1+ on the right side and 2+ on the left side. Bilateral LE strength is 5/5  Skin: Skin is warm and dry.  Psychiatric: She has a normal mood and affect. Her behavior is normal. Judgment and thought content normal.  MS: + tenderness overlying right hip.  + pain right hip with adduction/abduction/flexion         Assessment & Plan:  R hip pain- new. longstanding. rx with meloxicam. Refer to sports medicine for further evaluation.

## 2018-03-08 NOTE — Patient Instructions (Signed)
Please begin meloxicam. You should be contacted about your referral to sports medicine.

## 2018-03-16 ENCOUNTER — Ambulatory Visit (HOSPITAL_BASED_OUTPATIENT_CLINIC_OR_DEPARTMENT_OTHER)
Admission: RE | Admit: 2018-03-16 | Discharge: 2018-03-16 | Disposition: A | Discharge status: Home / Self Care | Payer: 59 | Source: Ambulatory Visit | Attending: Family Medicine | Admitting: Family Medicine

## 2018-03-16 ENCOUNTER — Ambulatory Visit: Payer: 59 | Admitting: Family Medicine

## 2018-03-16 ENCOUNTER — Encounter: Payer: Self-pay | Admitting: Family Medicine

## 2018-03-16 VITALS — BP 138/85 | HR 106 | Ht 59.0 in | Wt 184.0 lb

## 2018-03-16 DIAGNOSIS — M545 Low back pain: Secondary | ICD-10-CM | POA: Diagnosis not present

## 2018-03-16 DIAGNOSIS — G8929 Other chronic pain: Secondary | ICD-10-CM

## 2018-03-16 DIAGNOSIS — M47816 Spondylosis without myelopathy or radiculopathy, lumbar region: Secondary | ICD-10-CM | POA: Diagnosis not present

## 2018-03-16 DIAGNOSIS — M25551 Pain in right hip: Secondary | ICD-10-CM | POA: Insufficient documentation

## 2018-03-16 DIAGNOSIS — M5441 Lumbago with sciatica, right side: Secondary | ICD-10-CM

## 2018-03-16 DIAGNOSIS — M79604 Pain in right leg: Secondary | ICD-10-CM

## 2018-03-16 MED ORDER — PREDNISONE 10 MG PO TABS: ORAL_TABLET | ORAL | 0 refills | Status: DC

## 2018-03-16 MED FILL — predniSONE 10 MG TABS: 10 | 6 days supply | Qty: 21 | Fill #0

## 2018-03-16 NOTE — Patient Instructions (Signed)
You have lumbar radiculopathy (a pinched nerve in your low back) and external rotator strain of your hip. Ok to take tylenol for baseline pain relief (1-2 extra strength tabs 3x/day) A prednisone dose pack is the best option for immediate relief and may be prescribed. Day after finishing prednisone start ibuprofen 600mg  three times a day with food for pain and inflammation. Stay as active as possible. Physical therapy has been shown to be helpful as well - start this. Strengthening of low back muscles, abdominal musculature are key for long term pain relief. If not improving, will consider further imaging (MRI). Follow up with me in 1 month but call me sooner if you're struggling.

## 2018-03-17 ENCOUNTER — Encounter: Payer: Self-pay | Admitting: Family

## 2018-03-17 NOTE — Progress Notes (Signed)
Result has been mailed to pt.

## 2018-03-19 ENCOUNTER — Encounter: Payer: Self-pay | Admitting: Family Medicine

## 2018-03-19 DIAGNOSIS — M545 Low back pain, unspecified: Secondary | ICD-10-CM | POA: Insufficient documentation

## 2018-03-19 DIAGNOSIS — M79604 Pain in right leg: Secondary | ICD-10-CM | POA: Insufficient documentation

## 2018-03-19 NOTE — Assessment & Plan Note (Signed)
independently reviewed radiographs and no acute abnormalities or bony abnormalities to account for her pain.  She has history and exam findings that suggest both lumbar radiculopathy and hip pathology (hip abductor/external rotator strain).  She will start with prednisone dose pack then transition back to ibuprofen.  Start physical therapy and home exercises.  Fu in 1 month.

## 2018-03-19 NOTE — Progress Notes (Signed)
PCP and consultation requested by: Terri Alar, NP  Subjective:   HPI: Patient is a 52 y.o. female here for right hip pain.  Patient reports for about a year she's had off and on pain in right posterior hip. Pain currently is 5/10 level and she reports some back pain as well. Pain worse with sitting, after standing a lot at work. Tried meloxicam, ibuprofen. Pain radiates down to knee laterally. She denies injury before this (had a fall about 2 years ago). No bowel/bladder dysfunction. No skin changes, numbness.  Past Medical History:  Diagnosis Date  . Allergy   . Asthma   . B12 deficiency 05/14/2015  . Depression   . GERD (gastroesophageal reflux disease)   . History of chicken pox   . Migraines   . OSA (obstructive sleep apnea) 03/2016    Current Outpatient Medications on File Prior to Visit  Medication Sig Dispense Refill  . betamethasone valerate ointment (VALISONE) 0.1 % Apply small amount to ear as needed for itching/scaling.    Marland Kitchen buPROPion (WELLBUTRIN XL) 300 MG 24 hr tablet TAKE 1 TABLET BY MOUTH ONCE DAILY 90 tablet 0  . CYANOCOBALAMIN IJ Inject 1,000 mcg as directed every 30 (thirty) days.    . Ergocalciferol (VITAMIN D2) 2000 UNITS TABS Take 1 tablet by mouth daily. Reported on 03/22/2016    . fluticasone (FLONASE) 50 MCG/ACT nasal spray Place 2 sprays into both nostrils daily. 16 g 1  . levocetirizine (XYZAL) 5 MG tablet Take 1 tablet (5 mg total) by mouth every evening. 30 tablet 5  . meloxicam (MOBIC) 7.5 MG tablet Take 1 tablet (7.5 mg total) by mouth daily. 14 tablet 0  . montelukast (SINGULAIR) 10 MG tablet TAKE 1 TABLET BY MOUTH AT BEDTIME 90 tablet 1  . pantoprazole (PROTONIX) 40 MG tablet Take 1 tablet (40 mg total) by mouth daily. 30 tablet 5  . VENTOLIN HFA 108 (90 Base) MCG/ACT inhaler INHALE 2 PUFFS INTO THE LUNGS EVERY 6 HOURS AS NEEDED FOR WHEEZING OR SHORTNESS OF BREATH. 18 g 3   No current facility-administered medications on file prior to  visit.     Past Surgical History:  Procedure Laterality Date  . CESAREAN SECTION  1991  . CHOLECYSTECTOMY  2007  . eardrum repair - left Left   . INNER EAR SURGERY     Pt states she had her "eardrum rebuilt". Has had 8 sets of tubes in ears through [employer]  . MYRINGOTOMY Bilateral    twice each.  . TONSILLECTOMY AND ADENOIDECTOMY  1976  . TUBAL LIGATION  2001    Allergies  Allergen Reactions  . Aspirin Other (See Comments)    Gi/ringing ears  . Tetracycline Rash    Rash/throat swells    Social History   Socioeconomic History  . Marital status: Married    Spouse name: Not on file  . Number of children: Not on file  . Years of education: Not on file  . Highest education level: Not on file  Occupational History  . Not on file  Social Needs  . Financial resource strain: Not on file  . Food insecurity:    Worry: Not on file    Inability: Not on file  . Transportation needs:    Medical: Not on file    Non-medical: Not on file  Tobacco Use  . Smoking status: Never Smoker  . Smokeless tobacco: Never Used  Substance and Sexual Activity  . Alcohol use: No    Alcohol/week: 0.0  oz  . Drug use: No  . Sexual activity: Not on file  Lifestyle  . Physical activity:    Days per week: Not on file    Minutes per session: Not on file  . Stress: Not on file  Relationships  . Social connections:    Talks on phone: Not on file    Gets together: Not on file    Attends religious service: Not on file    Active member of club or organization: Not on file    Attends meetings of clubs or organizations: Not on file    Relationship status: Not on file  . Intimate partner violence:    Fear of current or ex partner: Not on file    Emotionally abused: Not on file    Physically abused: Not on file    Forced sexual activity: Not on file  Other Topics Concern  . 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     Has associates degree   Enjoys painting, animals, antiquing, shopping, reading, movies    Family History  Problem Relation Age of Onset  . Hyperlipidemia Mother   . Heart disease Mother   . Hypertension Mother   . Depression Mother   . Parkinson's disease Father   . Depression Maternal Grandmother   . Heart disease Maternal Grandfather   . Hypertension Maternal Grandfather   . Alcohol abuse Maternal Grandfather   . AVM Maternal Aunt        congenital  . Aneurysm Maternal Aunt   . Depression Maternal Aunt   . Alcohol abuse Maternal Uncle     BP 138/85   Pulse (!) 106   Ht 4\' 11"  (1.499 m)   Wt 184 lb (83.5 kg)   LMP 03/23/2014   BMI 37.16 kg/m   Review of Systems: See HPI above.     Objective:  Physical Exam:  Gen: NAD, comfortable in exam room  Back: No gross deformity, scoliosis. No back tenderness.  No midline or bony TTP. ROM full extension, lateral rotations, flexion to 50 degrees with pain. Strength LEs 5/5 all muscle groups except 4/5 right hip abduction.   2+ MSRs in patellar and achilles tendons, equal bilaterally. Negative SLRs. Sensation intact to light touch bilaterally.  Right hip: No deformity. FROM with 5/5 strength except 4/5 hip abduction. TTP posterior to greater trochanter. Positive logroll. Negative fabers, piriformis. NVI distally.   Assessment & Plan:  1. Low back and right hip pain - independently reviewed radiographs and no acute abnormalities or bony abnormalities to account for her pain.  She has history and exam findings that suggest both lumbar radiculopathy and hip pathology (hip abductor/external rotator strain).  She will start with prednisone dose pack then transition back to ibuprofen.  Start physical therapy and home exercises.  Fu in 1 month.  Note we discussed the calcifications noted on her x-rays - advised to follow up with PCP to discuss if any further workup required - she does not have any abdominal pain or GI/GU  symptoms.

## 2018-03-21 ENCOUNTER — Other Ambulatory Visit: Payer: Self-pay | Admitting: Family

## 2018-03-21 ENCOUNTER — Telehealth: Payer: Self-pay | Admitting: Family

## 2018-03-21 DIAGNOSIS — M7989 Other specified soft tissue disorders: Secondary | ICD-10-CM

## 2018-03-21 MED FILL — BUPROPION HCL XL 300 MG TAB: 300 | 90 days supply | Qty: 90 | Fill #0

## 2018-03-21 NOTE — Telephone Encounter (Signed)
Please contact pt and let her know that I reviewed the imaging that was done by Dr. Barbaraann Barthel. There was some calcifications noted in the right side of her abdomen that the radiologist wants [place] to take a closer look at. I would like her to complete an abdominal xray to re-examine. Order has been placed.

## 2018-03-22 NOTE — Telephone Encounter (Signed)
Notified pt. Radiology has already contacted pt and she will have xray on Monday after her therapy.

## 2018-03-27 ENCOUNTER — Other Ambulatory Visit: Payer: Self-pay

## 2018-03-27 ENCOUNTER — Encounter: Payer: Self-pay | Admitting: Physical Therapy

## 2018-03-27 ENCOUNTER — Ambulatory Visit: Payer: 59 | Attending: Family Medicine | Admitting: Physical Therapy

## 2018-03-27 DIAGNOSIS — R262 Difficulty in walking, not elsewhere classified: Secondary | ICD-10-CM | POA: Diagnosis not present

## 2018-03-27 DIAGNOSIS — R29898 Other symptoms and signs involving the musculoskeletal system: Secondary | ICD-10-CM | POA: Diagnosis not present

## 2018-03-27 DIAGNOSIS — G8929 Other chronic pain: Secondary | ICD-10-CM | POA: Insufficient documentation

## 2018-03-27 DIAGNOSIS — M5441 Lumbago with sciatica, right side: Secondary | ICD-10-CM | POA: Diagnosis not present

## 2018-03-27 NOTE — Therapy (Signed)
[facility] [address]  [place], [place], [address] Phone: [phone]   Fax:  [phone]  Physical Therapy Evaluation  Patient Details  Name: Terri Johnson MRN: [id number] Date of Birth: [birth year] Referring Provider: Karlton Lemon, MD   Encounter Date: 03/27/2018  PT End of Session - 03/27/18 1839    Visit Number  1    Number of Visits  17    Date for PT Re-Evaluation  05/22/18    Authorization Type  [facility]    PT Start Time  1530    PT Stop Time  1617    PT Time Calculation (min)  47 min    Activity Tolerance  Patient tolerated treatment well;Patient limited by pain    Behavior During Therapy  [facility] for tasks assessed/performed       Past Medical History:  Diagnosis Date  . Allergy   . Asthma   . B12 deficiency 05/14/2015  . Depression   . GERD (gastroesophageal reflux disease)   . History of chicken pox   . Migraines   . OSA (obstructive sleep apnea) 03/2016    Past Surgical History:  Procedure Laterality Date  . CESAREAN SECTION  1991  . CHOLECYSTECTOMY  2007  . eardrum repair - left Left   . INNER EAR SURGERY     Pt states she had her "eardrum rebuilt". Has had 8 sets of tubes in ears through [employer]  . MYRINGOTOMY Bilateral    twice each.  . TONSILLECTOMY AND ADENOIDECTOMY  1976  . TUBAL LIGATION  2001    There were no vitals filed for this visit.   Subjective Assessment - 03/27/18 1533    Subjective  Patient reports she had a fall 2 years ago on ice- cracked her head and had bruise on back. Reports her LBP pain started then. Had another occurrence of increased pain about a year ago when she was reaching for a box and had pain in LBP. Current symptoms: central LBP radiating to R buttock w/ intermittent N/T down to R knee. Also reports she has chronic R sided LB/side pain. Pain at best: 1/10, at worse: 10/10.    Pertinent History  L eardrum repair, myringotomy     Limitations   Sitting;Lifting;Standing;Walking;House hold activities    How long can you sit comfortably?  1 hour    How long can you stand comfortably?  2 hours     How long can you walk comfortably?  20 min    Diagnostic tests  lumbar xray 03/16/18: Mild diffuse degenerative change lumbar spine with mild scoliosis concave left. No acute or focal bony abnormality. Rounded calcific densities noted over the right lower quadrant. R hip x ray 03/16/18: negative    Currently in Pain?  Yes    Pain Score  4     Pain Location  Back    Pain Orientation  Lower    Pain Descriptors / Indicators  Aching nagging    Pain Type  Chronic pain    Pain Radiating Towards  R buttock    Aggravating Factors   picking something up, prolonged sitting/standing, walking, putting weight through R buttock in sitting    Pain Relieving Factors  bending forward, prednisone helped temporarily, ibuprofen/tylenol, laying with B LEs on pillow         [facility] PT Assessment - 03/27/18 1544      Assessment   Medical Diagnosis  R hip pain    Referring Provider  Karlton Lemon, MD    Onset Date/Surgical Date  -- 52 years ago    Next MD Visit  04/19/18    Prior Therapy  No      Precautions   Precautions  None      Restrictions   Weight Bearing Restrictions  No      Balance Screen   Has the patient fallen in the past 6 months  No    Has the patient had a decrease in activity level because of a fear of falling?   No    Is the patient reluctant to leave their home because of a fear of falling?   No      Home Film/video editor residence    Living Arrangements  Spouse/significant other    Available Help at Discharge  Family    Type of [place] to enter    Entrance Stairs-Number of Steps  1    Entrance Stairs-Rails  None    Home Layout  Two level    Alternate Level Stairs-Number of Steps  13    Alternate Level Stairs-Rails  Left    Home Equipment  None      Prior Function   Level  of Independence  Independent    Vocation  Full time employment    Associate Professor; walking, standing, lifting; also in school full time    Leisure  walking, painting      Cognition   Overall Cognitive Status  Within Functional Limits for tasks assessed      Observation/Other Assessments   Focus on Therapeutic Outcomes (FOTO)   Hip: 57 (43% limited, 39% predicted)      Sensation   Light Touch  Appears Intact intermittent N/T in B hands      Coordination   Gross Motor Movements are Fluid and Coordinated  Yes      Posture/Postural Control   Posture/Postural Control  Postural limitations    Postural Limitations  Forward head;Rounded Shoulders;Weight shift left    Posture Comments  in sitting      ROM / Strength   AROM / PROM / Strength  AROM;Strength;PROM      AROM   AROM Assessment Site  Lumbar;Hip    Right/Left Hip  Right;Left    Right Hip External Rotation   31 pain in R buttock/LB    Right Hip Internal Rotation   12 pain in R buttock/LB    Left Hip External Rotation   15    Left Hip Internal Rotation   30    Lumbar Flexion  distal shin pain in central L    Lumbar Extension  mildy restricted pain in central LB    Lumbar - Right Side Bend  distal thigh pain in central LB    Lumbar - Left Side Bend  distal thigh pain in LB    Lumbar - Right Rotation  WFL    Lumbar - Left Rotation  WFL pain in LB      Strength   Strength Assessment Site  Hip;Knee;Ankle    Right/Left Hip  Right;Left    Right Hip Flexion  4+/5    Right Hip ABduction  4+/5    Right Hip ADduction  4+/5    Left Hip Flexion  4+/5    Left Hip ABduction  4+/5    Left Hip ADduction  4+/5    Right/Left Knee  Right;Left  Right Knee Flexion  4+/5    Right Knee Extension  4+/5    Left Knee Flexion  4+/5    Left Knee Extension  4+/5    Right/Left Ankle  Right;Left    Right Ankle Dorsiflexion  4+/5 pain in R buttock    Right Ankle Plantar Flexion  4+/5    Left Ankle Dorsiflexion  4+/5    Left  Ankle Plantar Flexion  4+/5      Flexibility   Soft Tissue Assessment /Muscle Length  yes      Palpation   Palpation comment  TTP in central LB, R sacrum, piriformis                Objective measurements completed on examination: See above findings.      [facility] Adult PT Treatment/Exercise - 03/27/18 1544      Modalities   Modalities  Moist Heat      Moist Heat Therapy   Number Minutes Moist Heat  8 Minutes    Moist Heat Location  Lumbar Spine;Hip R             PT Education - 03/27/18 1839    Education Details  POC, prognosis, HEP    Person(s) Educated  Patient    Methods  Explanation;Demonstration;Tactile cues;Verbal cues;Handout    Comprehension  Verbalized understanding       PT Short Term Goals - 03/27/18 1849      PT SHORT TERM GOAL #1   Title  Patient to be independent with inital HEP.    Time  4    Period  Weeks    Status  New    Target Date  04/24/18        PT Long Term Goals - 03/27/18 1849      PT LONG TERM GOAL #1   Title  Patient to be independent with advanced HEP.    Time  8    Period  Weeks    Status  New    Target Date  05/22/18      PT LONG TERM GOAL #2   Title  Patient to demonstrate [facility] Lumbar AROM without pain limiting.     Time  8    Period  Weeks    Status  New    Target Date  05/22/18      PT LONG TERM GOAL #3   Title  Patient to demonstrate proper body mechanics when lifting 25# box.    Time  8    Period  Weeks    Status  New    Target Date  05/22/18      PT LONG TERM GOAL #4   Title  Patient to report tolerance of 1 hour of walking without pain limiting.     Time  8    Period  Weeks    Status  New    Target Date  05/22/18      PT LONG TERM GOAL #5   Title  Patient to report 1 full day of work with <=3/10 pain.    Time  8    Period  Weeks    Status  New    Target Date  05/22/18             Plan - 03/27/18 1841    Clinical Impression Statement  Patient is a 52y/o F presenting to OPPT with  c/o central LBP with radiation to R buttock with N/T intermittently of 2 year duration. Reports increased pain  with prolonged sitting, standing, walking and limited in lifting activities. Patient painful with nearly all lumbar AROM and TTP in R lumbar paraspinals, R side of sacrum, piriformis, and glute. Decreased tolerance for supine positioning and pain in LB/buttock with R hip AROM. Presents with the following impairments: decreased ROM,  pain, decreased functional activity tolerance. Would benefit from skilled PT services 2x/week for 8 weeks to address aforementioned impairments. Patient with limited tolerance for assessment at time of eval d/t pain- became emotional and deferred remaining measurements for next session. Patient received moist heat to R LB/buttock to ease pain. Patient received and educated on HEP handout for gentle strengthening and stretching; advised not to push into pain- patient reported understanding.     Clinical Presentation  Stable    Clinical Decision Making  Low    Rehab Potential  Good    PT Frequency  2x / week    PT Duration  8 weeks    PT Treatment/Interventions  ADLs/Self Care Home Management;Cryotherapy;Electrical Stimulation;Iontophoresis 4mg /ml Dexamethasone;Moist Heat;Traction;Ultrasound;Gait training;Stair training;Functional mobility training;Therapeutic activities;Therapeutic exercise;Manual techniques;Patient/family education;Neuromuscular re-education;Balance training;Passive range of motion;Dry needling;Energy conservation;Splinting;Taping;Vasopneumatic Device    PT Next Visit Plan  reassess HEP, STM    Consulted and Agree with Plan of Care  Patient       Patient will benefit from skilled therapeutic intervention in order to improve the following deficits and impairments:  Hypomobility, Decreased activity tolerance, Decreased strength, Pain, Decreased balance, Decreased mobility, Difficulty walking, Improper body mechanics, Decreased range of motion, Postural  dysfunction  Visit Diagnosis: Chronic midline low back pain with right-sided sciatica  Difficulty in walking, not elsewhere classified  Other symptoms and signs involving the musculoskeletal system     Problem List Patient Active Problem List   Diagnosis Date Noted  . Low back pain radiating to right leg 03/19/2018  . Overactive bladder 05/06/2016  . OSA (obstructive sleep apnea) 03/24/2016  . Migraines 03/23/2016  . Vitamin D deficiency 08/08/2015  . Vitamin B12 deficiency anemia 07/04/2015  . B12 deficiency 05/14/2015  . Hot flashes 05/11/2015  . Preventative health care 05/11/2015  . Asthma 03/24/2015  . GERD (gastroesophageal reflux disease) 03/24/2015  . Anxiety and depression 03/24/2015  . Headache 03/24/2015    Janene Harvey, PT, DPT 03/27/18 6:55 PM   [facility] [address]  [place], [place], [address] Phone: [phone]   Fax:  [phone]  Name: Wilhelmine Krogstad MRN: [id number] Date of Birth: 25-Dec-1965

## 2018-03-29 ENCOUNTER — Ambulatory Visit: Payer: 59

## 2018-03-29 DIAGNOSIS — R262 Difficulty in walking, not elsewhere classified: Secondary | ICD-10-CM | POA: Diagnosis not present

## 2018-03-29 DIAGNOSIS — M5441 Lumbago with sciatica, right side: Principal | ICD-10-CM

## 2018-03-29 DIAGNOSIS — R29898 Other symptoms and signs involving the musculoskeletal system: Secondary | ICD-10-CM | POA: Diagnosis not present

## 2018-03-29 DIAGNOSIS — G8929 Other chronic pain: Secondary | ICD-10-CM

## 2018-03-29 NOTE — Therapy (Signed)
[facility] [address]  [place], [place], [address] Phone: [phone]   Fax:  [phone]  Physical Therapy Treatment  Patient Details  Name: Terri Johnson MRN: [id number] Date of Birth: [birth year] Referring Provider: Karlton Lemon, MD   Encounter Date: 03/29/2018  PT End of Session - 03/29/18 1539    Visit Number  2    Number of Visits  17    Date for PT Re-Evaluation  05/22/18    Authorization Type  [facility]    PT Start Time  1533    PT Stop Time  1636    PT Time Calculation (min)  63 min    Activity Tolerance  Patient tolerated treatment well    Behavior During Therapy  [facility] for tasks assessed/performed       Past Medical History:  Diagnosis Date  . Allergy   . Asthma   . B12 deficiency 05/14/2015  . Depression   . GERD (gastroesophageal reflux disease)   . History of chicken pox   . Migraines   . OSA (obstructive sleep apnea) 03/2016    Past Surgical History:  Procedure Laterality Date  . CESAREAN SECTION  1991  . CHOLECYSTECTOMY  2007  . eardrum repair - left Left   . INNER EAR SURGERY     Pt states she had her "eardrum rebuilt". Has had 8 sets of tubes in ears through [employer]  . MYRINGOTOMY Bilateral    twice each.  . TONSILLECTOMY AND ADENOIDECTOMY  1976  . TUBAL LIGATION  2001    There were no vitals filed for this visit.  Subjective Assessment - 03/29/18 1535    Subjective  No new complaints.  Feels HEP is helping.      Pertinent History  L eardrum repair, myringotomy     Diagnostic tests  lumbar xray 03/16/18: Mild diffuse degenerative change lumbar spine with mild scoliosis concave left. No acute or focal bony abnormality. Rounded calcific densities noted over the right lower quadrant. R hip x ray 03/16/18: negative    Currently in Pain?  No/denies    Pain Score  0-No pain Rises to 8/10 at worst with standing and walking     Pain Location  Back    Pain Orientation  Lower    Pain  Descriptors / Indicators  Aching;Dull    Pain Type  Chronic pain    Pain Radiating Towards  into R buttocks     Aggravating Factors   Prolonged sitting/standing, walking     Pain Relieving Factors  shifting side/side in standing, sitting,     Multiple Pain Sites  No                       [facility] Adult PT Treatment/Exercise - 03/29/18 1556      Self-Care   Self-Care  Other Self-Care Comments    Other Self-Care Comments   Self-ball release to R glutes on wall; pt. ttp however tolerated well       Lumbar Exercises: Stretches   Single Knee to Chest Stretch  Right;Left;2 reps;30 seconds    Single Knee to Chest Stretch Limitations  towel assist     Piriformis Stretch  Right;2 reps;30 seconds    Piriformis Stretch Limitations  mod piriformis with towel behind knee       Lumbar Exercises: Aerobic   Nustep  Lvl 5, 7 min       Lumbar Exercises: Supine   Clam  10 reps;3 seconds    Clam Limitations  red looped TB at knees + adbom. bracing  Cues to avoid holding breath    Bridge  10 reps;3 seconds    Bridge Limitations  Limited ROM       Modalities   Modalities  Moist Heat;Electrical Stimulation      Moist Heat Therapy   Number Minutes Moist Heat  15 Minutes    Moist Heat Location  Hip R buttocks       Electrical Stimulation   Electrical Stimulation Location  15    Electrical Stimulation Action  IFC    Electrical Stimulation Parameters  to tolerance, 15'    Electrical Stimulation Goals  Pain;Tone      Manual Therapy   Manual Therapy  Myofascial release;Passive ROM;Soft tissue mobilization    Manual therapy comments  sidelying with R LE elevated on bolster     Soft tissue mobilization  STM to R glute/piriformis; noted some relief following this     Myofascial Release  TPR to R piriformis'; ttp however good response     Passive ROM  R manual glute, piriformis stretch with therapist x 30 sec each              PT Education - 03/29/18 1812    Education Details   HEP update     Person(s) Educated  Patient    Methods  Explanation;Demonstration;Verbal cues;Handout    Comprehension  Verbalized understanding;Returned demonstration;Need further instruction;Verbal cues required       PT Short Term Goals - 03/29/18 1540      PT SHORT TERM GOAL #1   Title  Patient to be independent with inital HEP.    Time  4    Period  Weeks    Status  On-going        PT Long Term Goals - 03/29/18 1608      PT LONG TERM GOAL #1   Title  Patient to be independent with advanced HEP.    Time  8    Period  Weeks    Status  On-going      PT LONG TERM GOAL #2   Title  Patient to demonstrate [facility] Lumbar AROM without pain limiting.     Time  8    Period  Weeks    Status  On-going      PT LONG TERM GOAL #3   Title  Patient to demonstrate proper body mechanics when lifting 25# box.    Time  8    Period  Weeks    Status  On-going      PT LONG TERM GOAL #4   Title  Patient to report tolerance of 1 hour of walking without pain limiting.     Time  8    Period  Weeks    Status  On-going      PT LONG TERM GOAL #5   Title  Patient to report 1 full day of work with <=3/10 pain.    Time  8    Period  Weeks    Status  On-going            Plan - 03/29/18 1539    Clinical Impression Statement  Rhythm doing well today with no new complaints.  Did note some improvement in pain levels since performing HEP.  Was ttp today in R glute/piriformis musculature thus manual therapy addressing this with STM/TPR and passive stretching with good response.  Duration of visit spent  reviewed self-ball release to glutes on wall and with gentle proximal hip strengthening activities, which were well tolerated.  Ended session with E-stim/moist heat to R buttocks to decrease pain and tone.      PT Treatment/Interventions  ADLs/Self Care Home Management;Cryotherapy;Electrical Stimulation;Iontophoresis 4mg /ml Dexamethasone;Moist Heat;Traction;Ultrasound;Gait training;Stair  training;Functional mobility training;Therapeutic activities;Therapeutic exercise;Manual techniques;Patient/family education;Neuromuscular re-education;Balance training;Passive range of motion;Dry needling;Energy conservation;Splinting;Taping;Vasopneumatic Device    PT Next Visit Plan  reassess HEP, STM       Patient will benefit from skilled therapeutic intervention in order to improve the following deficits and impairments:  Hypomobility, Decreased activity tolerance, Decreased strength, Pain, Decreased balance, Decreased mobility, Difficulty walking, Improper body mechanics, Decreased range of motion, Postural dysfunction  Visit Diagnosis: Chronic midline low back pain with right-sided sciatica  Difficulty in walking, not elsewhere classified  Other symptoms and signs involving the musculoskeletal system     Problem List Patient Active Problem List   Diagnosis Date Noted  . Low back pain radiating to right leg 03/19/2018  . Overactive bladder 05/06/2016  . OSA (obstructive sleep apnea) 03/24/2016  . Migraines 03/23/2016  . Vitamin D deficiency 08/08/2015  . Vitamin B12 deficiency anemia 07/04/2015  . B12 deficiency 05/14/2015  . Hot flashes 05/11/2015  . Preventative health care 05/11/2015  . Asthma 03/24/2015  . GERD (gastroesophageal reflux disease) 03/24/2015  . Anxiety and depression 03/24/2015  . Headache 03/24/2015   Bess Harvest, PTA 03/29/18 6:19 PM  [facility] [address]  [place], [place], [address] Phone: [phone]   Fax:  [phone]  Name: Terri Johnson MRN: [id number] Date of Birth: Sep 19, 1966

## 2018-03-31 ENCOUNTER — Ambulatory Visit (INDEPENDENT_AMBULATORY_CARE_PROVIDER_SITE_OTHER): Payer: 59

## 2018-03-31 DIAGNOSIS — E538 Deficiency of other specified B group vitamins: Secondary | ICD-10-CM

## 2018-03-31 MED ORDER — CYANOCOBALAMIN 1000 MCG/ML IJ SOLN
1000.0000 ug | Freq: Once | INTRAMUSCULAR | Status: AC
  Administered 2018-03-31: 1000 ug via INTRAMUSCULAR

## 2018-03-31 NOTE — Progress Notes (Signed)
Pre visit review using our clinic review tool, if applicable. No additional management support is needed unless otherwise documented below in the visit note.  Pt here for monthly B12 injection per   B12 1071mcg given IM in right deltoid, and pt tolerated injection well.  Next B12 injection scheduled for 04/03/18 at 3:30

## 2018-04-01 NOTE — Progress Notes (Signed)
Reviewed. ° ° °Detron Carras S O'Sullivan NP °

## 2018-04-03 ENCOUNTER — Ambulatory Visit: Payer: 59

## 2018-04-03 DIAGNOSIS — M5441 Lumbago with sciatica, right side: Principal | ICD-10-CM

## 2018-04-03 DIAGNOSIS — R262 Difficulty in walking, not elsewhere classified: Secondary | ICD-10-CM | POA: Diagnosis not present

## 2018-04-03 DIAGNOSIS — R29898 Other symptoms and signs involving the musculoskeletal system: Secondary | ICD-10-CM | POA: Diagnosis not present

## 2018-04-03 DIAGNOSIS — G8929 Other chronic pain: Secondary | ICD-10-CM | POA: Diagnosis not present

## 2018-04-03 NOTE — Therapy (Signed)
[facility] [address]  [place], [place], [address] Phone: [phone]   Fax:  [phone]  Physical Therapy Treatment  Patient Details  Name: Terri Johnson MRN: [id number] Date of Birth: Mar 03, 1966 Referring Provider: Karlton Lemon, MD   Encounter Date: 04/03/2018  PT End of Session - 04/03/18 1532    Visit Number  3    Number of Visits  17    Date for PT Re-Evaluation  05/22/18    Authorization Type  [facility]    PT Start Time  1527    PT Stop Time  1632    PT Time Calculation (min)  65 min    Activity Tolerance  Patient tolerated treatment well    Behavior During Therapy  [facility] for tasks assessed/performed       Past Medical History:  Diagnosis Date  . Allergy   . Asthma   . B12 deficiency 05/14/2015  . Depression   . GERD (gastroesophageal reflux disease)   . History of chicken pox   . Migraines   . OSA (obstructive sleep apnea) 03/2016    Past Surgical History:  Procedure Laterality Date  . CESAREAN SECTION  1991  . CHOLECYSTECTOMY  2007  . eardrum repair - left Left   . INNER EAR SURGERY     Pt states she had her "eardrum rebuilt". Has had 8 sets of tubes in ears through [employer]  . MYRINGOTOMY Bilateral    twice each.  . TONSILLECTOMY AND ADENOIDECTOMY  1976  . TUBAL LIGATION  2001    There were no vitals filed for this visit.  Subjective Assessment - 04/03/18 1529    Subjective  Pt. noting some improvement in LBP and buttocks pain since last visit.      Pertinent History  L eardrum repair, myringotomy     Diagnostic tests  lumbar xray 03/16/18: Mild diffuse degenerative change lumbar spine with mild scoliosis concave left. No acute or focal bony abnormality. Rounded calcific densities noted over the right lower quadrant. R hip x ray 03/16/18: negative    Currently in Pain?  Yes    Pain Score  5     Pain Location  Back    Pain Orientation  Lower    Pain Descriptors / Indicators  Aching;Dull     Pain Type  Chronic pain    Pain Radiating Towards  into R buttocks     Pain Onset  More than a month ago    Pain Frequency  Intermittent    Aggravating Factors   prolonged sitting     Multiple Pain Sites  No                       [facility] Adult PT Treatment/Exercise - 04/03/18 1538      Lumbar Exercises: Stretches   Piriformis Stretch  Right;3 reps;30 seconds    Piriformis Stretch Limitations  mod piriformis with towel  with towel assistance     Figure 4 Stretch  2 reps;30 seconds;Supine    Figure 4 Stretch Limitations  with towel assistance       Lumbar Exercises: Aerobic   Nustep  Lvl 5, 7 min       Lumbar Exercises: Standing   Other Standing Lumbar Exercises  R forward 8" step-up with 1 ski pole x 10 reps; cues to step down slowly and "squeeze R glute"      Lumbar Exercises: Seated   Other Seated Lumbar  Exercises  Seated R sciatic nerve glide 3" x 10 reps       Lumbar Exercises: Supine   Clam  3 seconds x 12 reps each way     Clam Limitations  red looped TB at knees + adbom. bracing     Bridge with Ball Squeeze  10 reps;3 seconds pillow squeeze       Lumbar Exercises: Sidelying   Clam  Right;10 reps    Clam Limitations  pillow between knees       Moist Heat Therapy   Number Minutes Moist Heat  15 Minutes    Moist Heat Location  Hip R buttocks       Electrical Stimulation   Electrical Stimulation Location  15    Electrical Stimulation Action  IFC    Electrical Stimulation Parameters  to tolerance, 15'    Electrical Stimulation Goals  Pain;Tone      Manual Therapy   Manual Therapy  Myofascial release;Passive ROM;Soft tissue mobilization    Manual therapy comments  sidelying with R LE elevated on bolster     Soft tissue mobilization  STM to R glute/piriformis; noted some relief following this     Myofascial Release  TPR to R piriformis'; ttp however good response              PT Education - 04/03/18 1644    Education Details  HEP update      Person(s) Educated  Patient    Methods  Explanation;Demonstration;Verbal cues;Handout    Comprehension  Verbalized understanding;Returned demonstration;Verbal cues required;Need further instruction       PT Short Term Goals - 04/03/18 1601      PT SHORT TERM GOAL #1   Title  Patient to be independent with inital HEP.    Time  4    Period  Weeks    Status  Achieved        PT Long Term Goals - 03/29/18 1608      PT LONG TERM GOAL #1   Title  Patient to be independent with advanced HEP.    Time  8    Period  Weeks    Status  On-going      PT LONG TERM GOAL #2   Title  Patient to demonstrate [facility] Lumbar AROM without pain limiting.     Time  8    Period  Weeks    Status  On-going      PT LONG TERM GOAL #3   Title  Patient to demonstrate proper body mechanics when lifting 25# box.    Time  8    Period  Weeks    Status  On-going      PT LONG TERM GOAL #4   Title  Patient to report tolerance of 1 hour of walking without pain limiting.     Time  8    Period  Weeks    Status  On-going      PT LONG TERM GOAL #5   Title  Patient to report 1 full day of work with <=3/10 pain.    Time  8    Period  Weeks    Status  On-going            Plan - 04/03/18 1533    Clinical Impression Statement  Terri Johnson seen to start session noting improved back and hip pain since last treatment.  Did note increased R hip/buttocks pain following excessive sitting at work yesterday.  Responded well to  STM/TPR to R glute/piriformis today and remainder of session focused on standing and supine level proximal hip strengthening activities to further decrease tone.  Ended session with E-stim/moist heat to R buttocks as pt. noting, "Relief for rest of day" following these modalities last visit.      PT Treatment/Interventions  ADLs/Self Care Home Management;Cryotherapy;Electrical Stimulation;Iontophoresis 4mg /ml Dexamethasone;Moist Heat;Traction;Ultrasound;Gait training;Stair training;Functional mobility  training;Therapeutic activities;Therapeutic exercise;Manual techniques;Patient/family education;Neuromuscular re-education;Balance training;Passive range of motion;Dry needling;Energy conservation;Splinting;Taping;Vasopneumatic Device    PT Next Visit Plan  --    Consulted and Agree with Plan of Care  Patient       Patient will benefit from skilled therapeutic intervention in order to improve the following deficits and impairments:  Hypomobility, Decreased activity tolerance, Decreased strength, Pain, Decreased balance, Decreased mobility, Difficulty walking, Improper body mechanics, Decreased range of motion, Postural dysfunction  Visit Diagnosis: Chronic midline low back pain with right-sided sciatica  Difficulty in walking, not elsewhere classified  Other symptoms and signs involving the musculoskeletal system     Problem List Patient Active Problem List   Diagnosis Date Noted  . Low back pain radiating to right leg 03/19/2018  . Overactive bladder 05/06/2016  . OSA (obstructive sleep apnea) 03/24/2016  . Migraines 03/23/2016  . Vitamin D deficiency 08/08/2015  . Vitamin B12 deficiency anemia 07/04/2015  . B12 deficiency 05/14/2015  . Hot flashes 05/11/2015  . Preventative health care 05/11/2015  . Asthma 03/24/2015  . GERD (gastroesophageal reflux disease) 03/24/2015  . Anxiety and depression 03/24/2015  . Headache 03/24/2015    Bess Harvest, PTA 04/03/18 4:52 PM   [facility] [address]  [place], [place], [address] Phone: [phone]   Fax:  [phone]  Name: Terri Johnson MRN: [id number] Date of Birth: [birth year]

## 2018-04-06 ENCOUNTER — Ambulatory Visit: Payer: 59 | Admitting: Physical Therapy

## 2018-04-06 DIAGNOSIS — R262 Difficulty in walking, not elsewhere classified: Secondary | ICD-10-CM

## 2018-04-06 DIAGNOSIS — R29898 Other symptoms and signs involving the musculoskeletal system: Secondary | ICD-10-CM | POA: Diagnosis not present

## 2018-04-06 DIAGNOSIS — G8929 Other chronic pain: Secondary | ICD-10-CM

## 2018-04-06 DIAGNOSIS — M5441 Lumbago with sciatica, right side: Secondary | ICD-10-CM | POA: Diagnosis not present

## 2018-04-06 NOTE — Therapy (Signed)
[facility] [address]  [place], [place], [address] Phone: [phone]   Fax:  [phone]  Physical Therapy Treatment  Patient Details  Name: Terri Johnson MRN: [id number] Date of Birth: 02-10-1966 Referring Provider: Karlton Lemon, MD   Encounter Date: 04/06/2018  PT End of Session - 04/06/18 1716    Visit Number  3    Number of Visits  17    Date for PT Re-Evaluation  05/22/18    Authorization Type  [facility]    PT Start Time  1531    PT Stop Time  1625    PT Time Calculation (min)  54 min    Activity Tolerance  Patient tolerated treatment well    Behavior During Therapy  [facility] for tasks assessed/performed       Past Medical History:  Diagnosis Date  . Allergy   . Asthma   . B12 deficiency 05/14/2015  . Depression   . GERD (gastroesophageal reflux disease)   . History of chicken pox   . Migraines   . OSA (obstructive sleep apnea) 03/2016    Past Surgical History:  Procedure Laterality Date  . CESAREAN SECTION  1991  . CHOLECYSTECTOMY  2007  . eardrum repair - left Left   . INNER EAR SURGERY     Pt states she had her "eardrum rebuilt". Has had 8 sets of tubes in ears through [employer]  . MYRINGOTOMY Bilateral    twice each.  . TONSILLECTOMY AND ADENOIDECTOMY  1976  . TUBAL LIGATION  2001    There were no vitals filed for this visit.  Subjective Assessment - 04/06/18 1533    Subjective  Patient reports she is doing well. Reports compliance with HEP.    Pertinent History  L eardrum repair, myringotomy     Diagnostic tests  lumbar xray 03/16/18: Mild diffuse degenerative change lumbar spine with mild scoliosis concave left. No acute or focal bony abnormality. Rounded calcific densities noted over the right lower quadrant. R hip x ray 03/16/18: negative    Currently in Pain?  Yes    Pain Score  3     Pain Location  Buttocks    Pain Orientation  Right    Pain Descriptors / Indicators  Aching                        [facility] Adult PT Treatment/Exercise - 04/06/18 0001      Lumbar Exercises: Stretches   Active Hamstring Stretch  --    Piriformis Stretch  Right;2 reps;20 seconds;Limitations    Piriformis Stretch Limitations  KTOS    Figure 4 Stretch  2 reps;20 seconds;With overpressure      Lumbar Exercises: Aerobic   Nustep  L5x6 min      Lumbar Exercises: Standing   Wall Slides  10 reps VCs for foot positionoing and form    Other Standing Lumbar Exercises  Sidestepping with red TB around B forefoot with UE support; 2x 15 ft      Lumbar Exercises: Supine   Bridge with [employer]  15 reps    Bridge with clamshell  15 reps;Limitations    Bridge with [employer] Limitations  red TB around knees    Other Supine Lumbar Exercises  windshield wipers to tolerance; 20x      Lumbar Exercises: Sidelying   Clam  Right;Left;10 reps;Limitations    Clam Limitations  red TB around knees  Moist Heat Therapy   Number Minutes Moist Heat  15 Minutes    Moist Heat Location  Hip R      Electrical Stimulation   Electrical Stimulation Location  15    Electrical Stimulation Action  IFC    Electrical Stimulation Parameters  Output: 11 to tolerance; 15 min    Electrical Stimulation Goals  Pain      Manual Therapy   Manual Therapy  Soft tissue mobilization;Myofascial release    Manual therapy comments  sidelying with R LE elevated on pillow    Soft tissue mobilization  STM to R glute/piriformis; noted some relief following this     Myofascial Release  TPR to R piriformis'; ttp however good response                PT Short Term Goals - 04/03/18 1601      PT SHORT TERM GOAL #1   Title  Patient to be independent with inital HEP.    Time  4    Period  Weeks    Status  Achieved        PT Long Term Goals - 03/29/18 1608      PT LONG TERM GOAL #1   Title  Patient to be independent with advanced HEP.    Time  8    Period  Weeks    Status  On-going      PT  LONG TERM GOAL #2   Title  Patient to demonstrate [facility] Lumbar AROM without pain limiting.     Time  8    Period  Weeks    Status  On-going      PT LONG TERM GOAL #3   Title  Patient to demonstrate proper body mechanics when lifting 25# box.    Time  8    Period  Weeks    Status  On-going      PT LONG TERM GOAL #4   Title  Patient to report tolerance of 1 hour of walking without pain limiting.     Time  8    Period  Weeks    Status  On-going      PT LONG TERM GOAL #5   Title  Patient to report 1 full day of work with <=3/10 pain.    Time  8    Period  Weeks    Status  On-going            Plan - 04/06/18 1716    Clinical Impression Statement  Patient arrived to session with no new complaints. Reports compliance with HEP. Tolerated STM to R piriformis and glute- patient with soft tissue restriction and tenderness in these areas. Reports self-STM helps her a lot. Patient tolerated progressive hip strengthening without c/o pain with exercises however visibly in pain when transferring from sidelying to supine. Patient performed sidestepping with TB resistance around B forefoot with UE support- report of muscle fatigue in lateral hips but no pain. Patient reporting e-stim and heat application improved her pain quite a bit last session- requested to receive these modalities at end of session. Normal integumentary response noted at end of session.    PT Treatment/Interventions  ADLs/Self Care Home Management;Cryotherapy;Electrical Stimulation;Iontophoresis 4mg /ml Dexamethasone;Moist Heat;Traction;Ultrasound;Gait training;Stair training;Functional mobility training;Therapeutic activities;Therapeutic exercise;Manual techniques;Patient/family education;Neuromuscular re-education;Balance training;Passive range of motion;Dry needling;Energy conservation;Splinting;Taping;Vasopneumatic Device    Consulted and Agree with Plan of Care  Patient       Patient will benefit from skilled therapeutic  intervention in order  to improve the following deficits and impairments:  Hypomobility, Decreased activity tolerance, Decreased strength, Pain, Decreased balance, Decreased mobility, Difficulty walking, Improper body mechanics, Decreased range of motion, Postural dysfunction  Visit Diagnosis: Chronic midline low back pain with right-sided sciatica  Difficulty in walking, not elsewhere classified  Other symptoms and signs involving the musculoskeletal system     Problem List Patient Active Problem List   Diagnosis Date Noted  . Low back pain radiating to right leg 03/19/2018  . Overactive bladder 05/06/2016  . OSA (obstructive sleep apnea) 03/24/2016  . Migraines 03/23/2016  . Vitamin D deficiency 08/08/2015  . Vitamin B12 deficiency anemia 07/04/2015  . B12 deficiency 05/14/2015  . Hot flashes 05/11/2015  . Preventative health care 05/11/2015  . Asthma 03/24/2015  . GERD (gastroesophageal reflux disease) 03/24/2015  . Anxiety and depression 03/24/2015  . Headache 03/24/2015     Janene Harvey, PT, DPT 04/06/18 5:20 PM   [facility] [address]  [place], [place], [address] Phone: [phone]   Fax:  [phone]  Name: Terri Johnson MRN: [id number] Date of Birth: [birth year]

## 2018-04-10 ENCOUNTER — Ambulatory Visit: Payer: 59

## 2018-04-10 DIAGNOSIS — R29898 Other symptoms and signs involving the musculoskeletal system: Secondary | ICD-10-CM

## 2018-04-10 DIAGNOSIS — R262 Difficulty in walking, not elsewhere classified: Secondary | ICD-10-CM | POA: Diagnosis not present

## 2018-04-10 DIAGNOSIS — M5441 Lumbago with sciatica, right side: Principal | ICD-10-CM

## 2018-04-10 DIAGNOSIS — G8929 Other chronic pain: Secondary | ICD-10-CM | POA: Diagnosis not present

## 2018-04-10 NOTE — Therapy (Signed)
[facility] [address]  [place], [place], [address] Phone: [phone]   Fax:  [phone]  Physical Therapy Treatment  Patient Details  Name: Terri Johnson MRN: [id number] Date of Birth: [birth year] Referring Provider: Karlton Lemon, MD   Encounter Date: 04/10/2018  PT End of Session - 04/10/18 1554    Visit Number  5 visit counter corrected from last visit     Number of Visits  17    Date for PT Re-Evaluation  05/22/18    Authorization Type  [facility]    PT Start Time  1532    PT Stop Time  1630    PT Time Calculation (min)  58 min    Activity Tolerance  Patient tolerated treatment well    Behavior During Therapy  [facility] for tasks assessed/performed       Past Medical History:  Diagnosis Date  . Allergy   . Asthma   . B12 deficiency 05/14/2015  . Depression   . GERD (gastroesophageal reflux disease)   . History of chicken pox   . Migraines   . OSA (obstructive sleep apnea) 03/2016    Past Surgical History:  Procedure Laterality Date  . CESAREAN SECTION  1991  . CHOLECYSTECTOMY  2007  . eardrum repair - left Left   . INNER EAR SURGERY     Pt states she had her "eardrum rebuilt". Has had 8 sets of tubes in ears through [employer]  . MYRINGOTOMY Bilateral    twice each.  . TONSILLECTOMY AND ADENOIDECTOMY  1976  . TUBAL LIGATION  2001    There were no vitals filed for this visit.  Subjective Assessment - 04/10/18 1534    Subjective  Pt. noting she sat all day yesterday so she has been hurting worse in R hip.  Felt great Saturday.      Pertinent History  L eardrum repair, myringotomy     How long can you sit comfortably?  2 hours     How long can you stand comfortably?  3 hours     How long can you walk comfortably?  30 min    Diagnostic tests  lumbar xray 03/16/18: Mild diffuse degenerative change lumbar spine with mild scoliosis concave left. No acute or focal bony abnormality. Rounded calcific densities  noted over the right lower quadrant. R hip x ray 03/16/18: negative    Currently in Pain?  Yes    Pain Score  2     Pain Location  Buttocks    Pain Orientation  Right    Pain Descriptors / Indicators  Aching    Pain Type  Chronic pain    Pain Radiating Towards  into R  buttocks and R lower back     Pain Onset  More than a month ago    Pain Frequency  Intermittent    Aggravating Factors   prolonged sitting, prolonged standing     Multiple Pain Sites  No                       [facility] Adult PT Treatment/Exercise - 04/10/18 1557      Lumbar Exercises: Stretches   Lumbar Stabilization Level 1  2 reps;20 seconds seated with green p-ball rollout    Piriformis Stretch  Right;2 reps;Limitations;30 seconds    Piriformis Stretch Limitations  KTOS    Figure 4 Stretch  2 reps;20 seconds;With overpressure    Figure 4 Stretch Limitations  with overpressure       Lumbar Exercises: Aerobic   Nustep  L5x6 min      Lumbar Exercises: Standing   Functional Squats  15 reps;3 seconds counter     Other Standing Lumbar Exercises  Sidestepping with red TB around B forefoot with UE support; 2 x25 ft      Lumbar Exercises: Supine   Bridge  15 reps;3 seconds      Moist Heat Therapy   Number Minutes Moist Heat  15 Minutes    Moist Heat Location  Hip R buttocks       Electrical Stimulation   Electrical Stimulation Location  15    Electrical Stimulation Action  IFC    Electrical Stimulation Parameters  to tolerance, 15'    Electrical Stimulation Goals  Pain      Manual Therapy   Manual Therapy  Soft tissue mobilization;Myofascial release    Manual therapy comments  sidelying with R LE elevated on pillow    Soft tissue mobilization  STM to R glute/piriformis; noted some relief following this     Myofascial Release  TPR to R piriformis'; ttp however good response              PT Education - 04/10/18 1816    Education Details  HEP update     Person(s) Educated  Patient     Methods  Explanation;Demonstration;Verbal cues;Handout    Comprehension  Verbalized understanding;Returned demonstration;Verbal cues required       PT Short Term Goals - 04/03/18 1601      PT SHORT TERM GOAL #1   Title  Patient to be independent with inital HEP.    Time  4    Period  Weeks    Status  Achieved        PT Long Term Goals - 03/29/18 1608      PT LONG TERM GOAL #1   Title  Patient to be independent with advanced HEP.    Time  8    Period  Weeks    Status  On-going      PT LONG TERM GOAL #2   Title  Patient to demonstrate [facility] Lumbar AROM without pain limiting.     Time  8    Period  Weeks    Status  On-going      PT LONG TERM GOAL #3   Title  Patient to demonstrate proper body mechanics when lifting 25# box.    Time  8    Period  Weeks    Status  On-going      PT LONG TERM GOAL #4   Title  Patient to report tolerance of 1 hour of walking without pain limiting.     Time  8    Period  Weeks    Status  On-going      PT LONG TERM GOAL #5   Title  Patient to report 1 full day of work with <=3/10 pain.    Time  8    Period  Weeks    Status  On-going            Plan - 04/10/18 1603    Clinical Impression Statement  Terri Johnson reporting good pain relief over weekend however notes return of pain on Sunday and today which she attributes to prolonged sitting at work. Did discuss strategies for perform LE stretching at work and need for frequent change in positions as to reduce R hip pain.  Terri Johnson tolerated  all manual therapy and therex today well noting near complete relief from R buttocks/lower back pain following session today.  Notes increased tolerance for duration of sitting/standing/walking before onset of pain since starting therapy at this point.  Will continue to progress toward goals.      PT Treatment/Interventions  ADLs/Self Care Home Management;Cryotherapy;Electrical Stimulation;Iontophoresis 4mg /ml Dexamethasone;Moist Heat;Traction;Ultrasound;Gait  training;Stair training;Functional mobility training;Therapeutic activities;Therapeutic exercise;Manual techniques;Patient/family education;Neuromuscular re-education;Balance training;Passive range of motion;Dry needling;Energy conservation;Splinting;Taping;Vasopneumatic Device    Consulted and Agree with Plan of Care  Patient       Patient will benefit from skilled therapeutic intervention in order to improve the following deficits and impairments:  Hypomobility, Decreased activity tolerance, Decreased strength, Pain, Decreased balance, Decreased mobility, Difficulty walking, Improper body mechanics, Decreased range of motion, Postural dysfunction  Visit Diagnosis: Chronic midline low back pain with right-sided sciatica  Difficulty in walking, not elsewhere classified  Other symptoms and signs involving the musculoskeletal system     Problem List Patient Active Problem List   Diagnosis Date Noted  . Low back pain radiating to right leg 03/19/2018  . Overactive bladder 05/06/2016  . OSA (obstructive sleep apnea) 03/24/2016  . Migraines 03/23/2016  . Vitamin D deficiency 08/08/2015  . Vitamin B12 deficiency anemia 07/04/2015  . B12 deficiency 05/14/2015  . Hot flashes 05/11/2015  . Preventative health care 05/11/2015  . Asthma 03/24/2015  . GERD (gastroesophageal reflux disease) 03/24/2015  . Anxiety and depression 03/24/2015  . Headache 03/24/2015    Bess Harvest, PTA 04/10/18 6:19 PM  [facility] [address]  [place], [place], [address] Phone: [phone]   Fax:  [phone]  Name: Terri Johnson MRN: [id number] Date of Birth: 05-10-1966

## 2018-04-13 ENCOUNTER — Encounter: Payer: Self-pay | Admitting: Physical Therapy

## 2018-04-13 ENCOUNTER — Ambulatory Visit: Payer: 59 | Admitting: Physical Therapy

## 2018-04-13 DIAGNOSIS — M5441 Lumbago with sciatica, right side: Principal | ICD-10-CM

## 2018-04-13 DIAGNOSIS — R29898 Other symptoms and signs involving the musculoskeletal system: Secondary | ICD-10-CM

## 2018-04-13 DIAGNOSIS — R262 Difficulty in walking, not elsewhere classified: Secondary | ICD-10-CM | POA: Diagnosis not present

## 2018-04-13 DIAGNOSIS — G8929 Other chronic pain: Secondary | ICD-10-CM | POA: Diagnosis not present

## 2018-04-13 NOTE — Therapy (Signed)
[facility] [address]  [place], [place], [address] Phone: [phone]   Fax:  [phone]  Physical Therapy Treatment  Patient Details  Name: Terri Johnson MRN: [id number] Date of Birth: [birth year] Referring Provider: Karlton Lemon, MD   Encounter Date: 04/13/2018  PT End of Session - 04/13/18 1620    Visit Number  6    Number of Visits  17    Date for PT Re-Evaluation  05/22/18    Authorization Type  [facility]    PT Start Time  1531    PT Stop Time  1626    PT Time Calculation (min)  55 min    Activity Tolerance  Patient tolerated treatment well    Behavior During Therapy  [facility] for tasks assessed/performed       Past Medical History:  Diagnosis Date  . Allergy   . Asthma   . B12 deficiency 05/14/2015  . Depression   . GERD (gastroesophageal reflux disease)   . History of chicken pox   . Migraines   . OSA (obstructive sleep apnea) 03/2016    Past Surgical History:  Procedure Laterality Date  . CESAREAN SECTION  1991  . CHOLECYSTECTOMY  2007  . eardrum repair - left Left   . INNER EAR SURGERY     Pt states she had her "eardrum rebuilt". Has had 8 sets of tubes in ears through [employer]  . MYRINGOTOMY Bilateral    twice each.  . TONSILLECTOMY AND ADENOIDECTOMY  1976  . TUBAL LIGATION  2001    There were no vitals filed for this visit.  Subjective Assessment - 04/13/18 1531    Subjective  Reports buttock pain has not gotten worse than 2/10. Did try to help husband with [facility] unit and that flared it up a bit. Reports 75% improved since initial eval. Reports remaining trouble with flare ups after prolonged sitting and standing.     Pertinent History  L eardrum repair, myringotomy     Diagnostic tests  lumbar xray 03/16/18: Mild diffuse degenerative change lumbar spine with mild scoliosis concave left. No acute or focal bony abnormality. Rounded calcific densities noted over the right lower quadrant. R hip x  ray 03/16/18: negative    Currently in Pain?  Yes    Pain Score  2     Pain Location  Buttocks    Pain Orientation  Right    Pain Descriptors / Indicators  Aching    Pain Type  Chronic pain         [facility] PT Assessment - 04/13/18 0001      AROM   AROM Assessment Site  Lumbar    Lumbar Flexion  distal shin    Lumbar Extension  mildly restricted 1/10 pinch in L LB    Lumbar - Right Side Bend  jt line    Lumbar - Left Side Bend  jt line    Lumbar - Right Rotation  WFL    Lumbar - Left Rotation  WFL                   [facility] Adult PT Treatment/Exercise - 04/13/18 0001      Therapeutic Activites    Therapeutic Activities  Lifting    Lifting  lifting 5 # and 10# object with hip hinge, squat, and lunge techniques x 7 min      Lumbar Exercises: Stretches   Figure 4 Stretch  2 reps;20 seconds;With overpressure  Lumbar Exercises: Aerobic   Nustep  L3x6 min      Lumbar Exercises: Standing   Wall Slides  10 reps;Limitations    Wall Slides Limitations  wall squats    Other Standing Lumbar Exercises  Sidestepping with red TB around B forefoot with UE support; 4x length of counter      Lumbar Exercises: Supine   Bridge with Ball Squeeze  15 reps      Lumbar Exercises: Sidelying   Other Sidelying Lumbar Exercises  SL trunk rotation 5x each side      Moist Heat Therapy   Number Minutes Moist Heat  15 Minutes    Moist Heat Location  Hip R posterolateral hip      Electrical Stimulation   Electrical Stimulation Location  15    Electrical Stimulation Action  IFC    Electrical Stimulation Parameters  Output: 12 to tolerance; 15 min    Electrical Stimulation Goals  Pain      Manual Therapy   Manual Therapy  Soft tissue mobilization;Myofascial release    Manual therapy comments  sidelying with R LE elevated on pillow    Soft tissue mobilization  STM to R glute/piriformis; noted some relief following this     Myofascial Release  TPR to R piriformis'; ttp however good  response                PT Short Term Goals - 04/13/18 1537      PT SHORT TERM GOAL #1   Title  Patient to be independent with inital HEP.    Time  4    Period  Weeks    Status  Achieved        PT Long Term Goals - 04/13/18 1537      PT LONG TERM GOAL #1   Title  Patient to be independent with advanced HEP.    Time  8    Period  Weeks    Status  Partially Met met for current      PT LONG TERM GOAL #2   Title  Patient to demonstrate [facility] Lumbar AROM without pain limiting.     Time  8    Period  Weeks    Status  Partially Met improvements in pain-free motion demonstrated, still mild pain with lumbar extension      PT LONG TERM GOAL #3   Title  Patient to demonstrate proper body mechanics when lifting 25# box.    Time  8    Period  Weeks    Status  On-going demonstrated good body mechanics when lifting 10# object      PT LONG TERM GOAL #4   Title  Patient to report tolerance of 1 hour of walking without pain limiting.     Time  8    Period  Weeks    Status  On-going reports 30-45 min walking without pain      PT LONG TERM GOAL #5   Title  Patient to report 1 full day of work with <=3/10 pain.    Time  8    Period  Weeks    Status  On-going 4-5/10 pain with work today            Plan - 04/13/18 1622    Clinical Impression Statement  Patient arrived to session with 75% improvement in symptoms since initial eval. Reports she still has trouble with flare ups in R buttock with prolonged standing and sitting. Updated goals this  session- patient has shown improvements in pain-free lumbar AROM, still mild c/o pain with lumbar extension. Able to demonstrate safe lifting mechanics with 5# and 10# object this date after education on lifting techniques. Patient tolerated STM to R piriformis and glute- TTP and soft tissue restriction in these areas. Educated on DN with possible use of this modality in future visits. Patient also performed wall squats and sidestepping  with TB resistance- no pain. Requested moist heat and e-stim to R buttock at end of session as she reports this helps her symptoms; normal integumentary response observed at end of session.     PT Treatment/Interventions  ADLs/Self Care Home Management;Cryotherapy;Electrical Stimulation;Iontophoresis 48m/ml Dexamethasone;Moist Heat;Traction;Ultrasound;Gait training;Stair training;Functional mobility training;Therapeutic activities;Therapeutic exercise;Manual techniques;Patient/family education;Neuromuscular re-education;Balance training;Passive range of motion;Dry needling;Energy conservation;Splinting;Taping;Vasopneumatic Device    PT Next Visit Plan  reassess lifting mechanics up to 25#    Consulted and Agree with Plan of Care  Patient       Patient will benefit from skilled therapeutic intervention in order to improve the following deficits and impairments:  Hypomobility, Decreased activity tolerance, Decreased strength, Pain, Decreased balance, Decreased mobility, Difficulty walking, Improper body mechanics, Decreased range of motion, Postural dysfunction  Visit Diagnosis: Chronic midline low back pain with right-sided sciatica  Difficulty in walking, not elsewhere classified  Other symptoms and signs involving the musculoskeletal system     Problem List Patient Active Problem List   Diagnosis Date Noted  . Low back pain radiating to right leg 03/19/2018  . Overactive bladder 05/06/2016  . OSA (obstructive sleep apnea) 03/24/2016  . Migraines 03/23/2016  . Vitamin D deficiency 08/08/2015  . Vitamin B12 deficiency anemia 07/04/2015  . B12 deficiency 05/14/2015  . Hot flashes 05/11/2015  . Preventative health care 05/11/2015  . Asthma 03/24/2015  . GERD (gastroesophageal reflux disease) 03/24/2015  . Anxiety and depression 03/24/2015  . Headache 03/24/2015    YJanene Harvey PT, DPT 04/13/18 4:34 PM  [facility] [address] [place] [address]Phone: [phone]  Fax:  [phone] Name: Terri KinterMRN: [id number] of Birth: 9May 29, 1967

## 2018-04-17 ENCOUNTER — Ambulatory Visit: Payer: 59 | Admitting: Family

## 2018-04-19 ENCOUNTER — Ambulatory Visit: Payer: 59 | Admitting: Family Medicine

## 2018-04-24 ENCOUNTER — Ambulatory Visit: Payer: 59 | Attending: Family Medicine

## 2018-04-24 DIAGNOSIS — R262 Difficulty in walking, not elsewhere classified: Secondary | ICD-10-CM | POA: Diagnosis not present

## 2018-04-24 DIAGNOSIS — M5441 Lumbago with sciatica, right side: Secondary | ICD-10-CM | POA: Insufficient documentation

## 2018-04-24 DIAGNOSIS — G8929 Other chronic pain: Secondary | ICD-10-CM | POA: Diagnosis not present

## 2018-04-24 DIAGNOSIS — R29898 Other symptoms and signs involving the musculoskeletal system: Secondary | ICD-10-CM | POA: Diagnosis not present

## 2018-04-24 NOTE — Therapy (Signed)
[facility] [address]  [place], [place], [address] Phone: [phone]   Fax:  [phone]  Physical Therapy Treatment  Patient Details  Name: Terri Johnson MRN: [id number] Date of Birth: November 14, 1965 Referring Provider: Karlton Lemon, MD   Encounter Date: 04/24/2018  PT End of Session - 04/24/18 1534    Visit Number  7    Number of Visits  17    Date for PT Re-Evaluation  05/22/18    Authorization Type  [facility]    PT Start Time  1530    PT Stop Time  1631    PT Time Calculation (min)  61 min    Activity Tolerance  Patient tolerated treatment well    Behavior During Therapy  [facility] for tasks assessed/performed       Past Medical History:  Diagnosis Date  . Allergy   . Asthma   . B12 deficiency 05/14/2015  . Depression   . GERD (gastroesophageal reflux disease)   . History of chicken pox   . Migraines   . OSA (obstructive sleep apnea) 03/2016    Past Surgical History:  Procedure Laterality Date  . CESAREAN SECTION  1991  . CHOLECYSTECTOMY  2007  . eardrum repair - left Left   . INNER EAR SURGERY     Pt states she had her "eardrum rebuilt". Has had 8 sets of tubes in ears through [employer]  . MYRINGOTOMY Bilateral    twice each.  . TONSILLECTOMY AND ADENOIDECTOMY  1976  . TUBAL LIGATION  2001    There were no vitals filed for this visit.  Subjective Assessment - 04/24/18 1532    Subjective  Pt. doing well today.      Pertinent History  L eardrum repair, myringotomy     Diagnostic tests  lumbar xray 03/16/18: Mild diffuse degenerative change lumbar spine with mild scoliosis concave left. No acute or focal bony abnormality. Rounded calcific densities noted over the right lower quadrant. R hip x ray 03/16/18: negative    Currently in Pain?  Yes    Pain Score  5     Pain Location  Buttocks    Pain Orientation  Right    Pain Descriptors / Indicators  Aching    Pain Type  Chronic pain    Pain Radiating  Towards  into R buttocks    Pain Onset  More than a month ago    Pain Frequency  Intermittent                       [facility] Adult PT Treatment/Exercise - 04/24/18 1539      Self-Care   Self-Care  Other Self-Care Comments    Other Self-Care Comments   review of self-tennis ball release with small ball on wall x 30 sec to R glute       Therapeutic Activites    Therapeutic Activities  Lifting    Lifting  Liting with ~ 25# box working on proper lifting techinque with some cueing required       Lumbar Exercises: Stretches   Piriformis Stretch  Right;2 reps;Limitations;30 seconds    Piriformis Stretch Limitations  KTOS    Figure 4 Stretch  2 reps;20 seconds;With overpressure    Figure 4 Stretch Limitations  with overpressure     Other Lumbar Stretch Exercise  R sciatic nerve glides x 15 reps       Lumbar Exercises: Aerobic   Nustep  L3 x 7 min      Lumbar Exercises: Standing   Functional Squats  15 reps;5 seconds    Wall Slides  15 reps;3 seconds    Wall Slides Limitations  wall squats leaning on orange p-ball       Lumbar Exercises: Supine   Bridge  15 reps;5 seconds      Lumbar Exercises: Quadruped   Straight Leg Raise  10 reps;3 seconds    Straight Leg Raises Limitations  on peanut p-ball       Moist Heat Therapy   Number Minutes Moist Heat  15 Minutes    Moist Heat Location  Hip R buttocks       Electrical Stimulation   Electrical Stimulation Location  15    Electrical Stimulation Action  IFC    Electrical Stimulation Parameters  to tolerance, 15'    Electrical Stimulation Goals  Pain      Manual Therapy   Manual Therapy  Soft tissue mobilization;Myofascial release    Manual therapy comments  sidelying     Soft tissue mobilization  R glute/piri in area of tenderness                PT Short Term Goals - 04/13/18 1537      PT SHORT TERM GOAL #1   Title  Patient to be independent with inital HEP.    Time  4    Period  Weeks    Status   Achieved        PT Long Term Goals - 04/13/18 1537      PT LONG TERM GOAL #1   Title  Patient to be independent with advanced HEP.    Time  8    Period  Weeks    Status  Partially Met met for current      PT LONG TERM GOAL #2   Title  Patient to demonstrate [facility] Lumbar AROM without pain limiting.     Time  8    Period  Weeks    Status  Partially Met improvements in pain-free motion demonstrated, still mild pain with lumbar extension      PT LONG TERM GOAL #3   Title  Patient to demonstrate proper body mechanics when lifting 25# box.    Time  8    Period  Weeks    Status  On-going demonstrated good body mechanics when lifting 10# object      PT LONG TERM GOAL #4   Title  Patient to report tolerance of 1 hour of walking without pain limiting.     Time  8    Period  Weeks    Status  On-going reports 30-45 min walking without pain      PT LONG TERM GOAL #5   Title  Patient to report 1 full day of work with <=3/10 pain.    Time  8    Period  Weeks    Status  On-going 4-5/10 pain with work today            Plan - 04/24/18 1534    Clinical Impression Statement  Lizzet reporting she has noticed some increased R buttocks/back pain over this past week since she has not been able to attend therapy due to scheduling conflicts.  Found good relief following manual therapy and therex from R proximal hip pain and tolerated progression of standing therex well today.  Ended session with E-stim/moist heat to R buttocks to further promote relaxation of  musculature and improved comfort.  Pt. reporting ~ 80% reduction in pain since starting therapy.      PT Treatment/Interventions  ADLs/Self Care Home Management;Cryotherapy;Electrical Stimulation;Iontophoresis 24m/ml Dexamethasone;Moist Heat;Traction;Ultrasound;Gait training;Stair training;Functional mobility training;Therapeutic activities;Therapeutic exercise;Manual techniques;Patient/family education;Neuromuscular re-education;Balance  training;Passive range of motion;Dry needling;Energy conservation;Splinting;Taping;Vasopneumatic Device    Consulted and Agree with Plan of Care  Patient       Patient will benefit from skilled therapeutic intervention in order to improve the following deficits and impairments:  Hypomobility, Decreased activity tolerance, Decreased strength, Pain, Decreased balance, Decreased mobility, Difficulty walking, Improper body mechanics, Decreased range of motion, Postural dysfunction  Visit Diagnosis: Chronic midline low back pain with right-sided sciatica  Difficulty in walking, not elsewhere classified  Other symptoms and signs involving the musculoskeletal system     Problem List Patient Active Problem List   Diagnosis Date Noted  . Low back pain radiating to right leg 03/19/2018  . Overactive bladder 05/06/2016  . OSA (obstructive sleep apnea) 03/24/2016  . Migraines 03/23/2016  . Vitamin D deficiency 08/08/2015  . Vitamin B12 deficiency anemia 07/04/2015  . B12 deficiency 05/14/2015  . Hot flashes 05/11/2015  . Preventative health care 05/11/2015  . Asthma 03/24/2015  . GERD (gastroesophageal reflux disease) 03/24/2015  . Anxiety and depression 03/24/2015  . Headache 03/24/2015    MBess Harvest PTA 04/24/18 4:43 PM   [facility] [address] [place] [address]Phone: [phone]  Fax:  [phone] Name: TShaheen StarMRN: [id number] of Birth: [birth year]

## 2018-04-26 ENCOUNTER — Encounter: Payer: 59 | Admitting: Physical Therapy

## 2018-05-01 ENCOUNTER — Ambulatory Visit: Payer: 59 | Admitting: Physical Therapy

## 2018-05-01 ENCOUNTER — Encounter: Payer: Self-pay | Admitting: Physical Therapy

## 2018-05-01 ENCOUNTER — Ambulatory Visit: Payer: 59 | Admitting: Family Medicine

## 2018-05-01 ENCOUNTER — Encounter: Payer: Self-pay | Admitting: Family Medicine

## 2018-05-01 DIAGNOSIS — G8929 Other chronic pain: Secondary | ICD-10-CM

## 2018-05-01 DIAGNOSIS — R262 Difficulty in walking, not elsewhere classified: Secondary | ICD-10-CM

## 2018-05-01 DIAGNOSIS — R29898 Other symptoms and signs involving the musculoskeletal system: Secondary | ICD-10-CM

## 2018-05-01 DIAGNOSIS — M5441 Lumbago with sciatica, right side: Secondary | ICD-10-CM | POA: Diagnosis not present

## 2018-05-01 DIAGNOSIS — M545 Low back pain: Secondary | ICD-10-CM

## 2018-05-01 DIAGNOSIS — M79604 Pain in right leg: Secondary | ICD-10-CM

## 2018-05-01 NOTE — Assessment & Plan Note (Signed)
Radiographs negative for abnormalities to account for her pain.  Her exam mostly with tenderness now but rest of exam negative and reassuring, improved strength.  Previous visit suggestive of lumbar radiculopathy and hip abductor/external rotator strain.  S/p prednisone dose pack.  She will transition from physical therapy to home exercise program.  Take tylenol, ibuprofen as needed.  F/u in 6 weeks.

## 2018-05-01 NOTE — Progress Notes (Signed)
PCP and consultation requested by: Debbrah Alar, NP  Subjective:   HPI: Patient is a 52 y.o. female here for right hip pain.  5/30: Patient reports for about a year she's had off and on pain in right posterior hip. Pain currently is 5/10 level and she reports some back pain as well. Pain worse with sitting, after standing a lot at work. Tried meloxicam, ibuprofen. Pain radiates down to knee laterally. She denies injury before this (had a fall about 2 years ago). No bowel/bladder dysfunction. No skin changes, numbness.  7/15: Patient reports she's done well with physical therapy and home exercise program. Pain currently a 3/10 in her low back. Attributes current increase in pain to going to [facility] to help her mother-in-law for a day, being in car for about 6 hours total. She takes ibuprofen or tylenol. Not radiating down her right leg like her pain was. No numbness, bowel/bladder dysfunction.  Past Medical History:  Diagnosis Date  . Allergy   . Asthma   . B12 deficiency 05/14/2015  . Depression   . GERD (gastroesophageal reflux disease)   . History of chicken pox   . Migraines   . OSA (obstructive sleep apnea) 03/2016    Current Outpatient Medications on File Prior to Visit  Medication Sig Dispense Refill  . betamethasone valerate ointment (VALISONE) 0.1 % Apply small amount to ear as needed for itching/scaling.    Marland Kitchen buPROPion (WELLBUTRIN XL) 300 MG 24 hr tablet TAKE 1 TABLET BY MOUTH ONCE DAILY 90 tablet 1  . CYANOCOBALAMIN IJ Inject 1,000 mcg as directed every 30 (thirty) days.    . Ergocalciferol (VITAMIN D2) 2000 UNITS TABS Take 1 tablet by mouth daily. Reported on 03/22/2016    . fluticasone (FLONASE) 50 MCG/ACT nasal spray Place 2 sprays into both nostrils daily. 16 g 1  . levocetirizine (XYZAL) 5 MG tablet Take 1 tablet (5 mg total) by mouth every evening. 30 tablet 5  . meloxicam (MOBIC) 7.5 MG tablet Take 1 tablet (7.5 mg total) by mouth daily. 14  tablet 0  . montelukast (SINGULAIR) 10 MG tablet TAKE 1 TABLET BY MOUTH AT BEDTIME 90 tablet 1  . pantoprazole (PROTONIX) 40 MG tablet Take 1 tablet (40 mg total) by mouth daily. 30 tablet 5  . predniSONE (DELTASONE) 10 MG tablet 6 tabs po day 1, 5 tabs po day 2, 4 tabs po day 3, 3 tabs po day 4, 2 tabs po day 5, 1 tab po day 6 21 tablet 0  . VENTOLIN HFA 108 (90 Base) MCG/ACT inhaler INHALE 2 PUFFS INTO THE LUNGS EVERY 6 HOURS AS NEEDED FOR WHEEZING OR SHORTNESS OF BREATH. 18 g 3   No current facility-administered medications on file prior to visit.     Past Surgical History:  Procedure Laterality Date  . CESAREAN SECTION  1991  . CHOLECYSTECTOMY  2007  . eardrum repair - left Left   . INNER EAR SURGERY     Pt states she had her "eardrum rebuilt". Has had 8 sets of tubes in ears through [employer]  . MYRINGOTOMY Bilateral    twice each.  . TONSILLECTOMY AND ADENOIDECTOMY  1976  . TUBAL LIGATION  2001    Allergies  Allergen Reactions  . Aspirin Other (See Comments)    Gi/ringing ears  . Tetracycline Rash    Rash/throat swells    Social History   Socioeconomic History  . Marital status: Married    Spouse name: Not on file  .  Number of children: Not on file  . Years of education: Not on file  . Highest education level: Not on file  Occupational History  . Not on file  Social Needs  . Financial resource strain: Not on file  . Food insecurity:    Worry: Not on file    Inability: Not on file  . Transportation needs:    Medical: Not on file    Non-medical: Not on file  Tobacco Use  . Smoking status: Never Smoker  . Smokeless tobacco: Never Used  Substance and Sexual Activity  . Alcohol use: No    Alcohol/week: 0.0 oz  . Drug use: No  . Sexual activity: Not on file  Lifestyle  . Physical activity:    Days per week: Not on file    Minutes per session: Not on file  . Stress: Not on file  Relationships  . Social connections:    Talks on phone: Not on file    Gets  together: Not on file    Attends religious service: Not on file    Active member of club or organization: Not on file    Attends meetings of clubs or organizations: Not on file    Relationship status: Not on file  . Intimate partner violence:    Fear of current or ex partner: Not on file    Emotionally abused: Not on file    Physically abused: Not on file    Forced sexual activity: Not on file  Other Topics Concern  . 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    Has associates degree   Enjoys painting, animals, antiquing, shopping, reading, movies    Family History  Problem Relation Age of Onset  . Hyperlipidemia Mother   . Heart disease Mother   . Hypertension Mother   . Depression Mother   . Parkinson's disease Father   . Depression Maternal Grandmother   . Heart disease Maternal Grandfather   . Hypertension Maternal Grandfather   . Alcohol abuse Maternal Grandfather   . AVM Maternal Aunt        congenital  . Aneurysm Maternal Aunt   . Depression Maternal Aunt   . Alcohol abuse Maternal Uncle     BP (!) 146/93   Pulse (!) 106   Ht 4\' 11"  (1.499 m)   Wt 180 lb (81.6 kg)   LMP 03/23/2014   BMI 36.36 kg/m   Review of Systems: See HPI above.     Objective:  Physical Exam:  Gen: NAD, comfortable in exam room  Back: No gross deformity, scoliosis. TTP right lumbar paraspinal region and buttocks.  No midline or bony TTP. FROM. Strength LEs 5/5 all muscle groups including right hip abduction now.  2+ MSRs in patellar and achilles tendons, equal bilaterally. Negative SLRs. Sensation intact to light touch bilaterally.  Right hip: No deformity. FROM with 5/5 strength. TTP over external rotators, gluteal muscles. NVI distally. Negative logroll Negative fabers and piriformis stretches.   Assessment & Plan:  1. Low back and right hip pain - Radiographs negative for abnormalities to  account for her pain.  Her exam mostly with tenderness now but rest of exam negative and reassuring, improved strength.  Previous visit suggestive of lumbar radiculopathy and hip abductor/external rotator strain.  S/p prednisone dose pack.  She will transition from physical therapy to home exercise program.  Take  tylenol, ibuprofen as needed.  F/u in 6 weeks.

## 2018-05-01 NOTE — Patient Instructions (Signed)
You're improving as expected. Finish out physical therapy and do home exercises on days you don't go to therapy. Ibuprofen just as needed or tylenol if needed. Heat 15 minutes at a time 3-4 times a day as needed. Follow up with me in 6 weeks (or as needed if you're doing well).

## 2018-05-01 NOTE — Patient Instructions (Signed)

## 2018-05-01 NOTE — Therapy (Signed)
[facility] [address]  [place], [place], [address] Phone: [phone]   Fax:  [phone]  Physical Therapy Treatment  Patient Details  Name: Terri Johnson MRN: [id number] Date of Birth: 1965/11/10 Referring Provider: Karlton Lemon, MD   Encounter Date: 05/01/2018  PT End of Session - 05/01/18 1529    Visit Number  8    Number of Visits  17    Date for PT Re-Evaluation  05/22/18    Authorization Type  [facility]    PT Start Time  1528    PT Stop Time  1629    PT Time Calculation (min)  61 min    Activity Tolerance  Patient tolerated treatment well    Behavior During Therapy  [facility] for tasks assessed/performed       Past Medical History:  Diagnosis Date  . Allergy   . Asthma   . B12 deficiency 05/14/2015  . Depression   . GERD (gastroesophageal reflux disease)   . History of chicken pox   . Migraines   . OSA (obstructive sleep apnea) 03/2016    Past Surgical History:  Procedure Laterality Date  . CESAREAN SECTION  1991  . CHOLECYSTECTOMY  2007  . eardrum repair - left Left   . INNER EAR SURGERY     Pt states she had her "eardrum rebuilt". Has had 8 sets of tubes in ears through [employer]  . MYRINGOTOMY Bilateral    twice each.  . TONSILLECTOMY AND ADENOIDECTOMY  1976  . TUBAL LIGATION  2001    There were no vitals filed for this visit.  Subjective Assessment - 05/01/18 1532    Subjective  Pt saw Dr. Barbaraann Barthel today - states he was pleased with her progress and wants her to finish out her remaining visits and then transition to HEP. Noting some increased pain/soreness today after riding to the beach and back over the weekend.    Pertinent History  L eardrum repair, myringotomy     Diagnostic tests  lumbar xray 03/16/18: Mild diffuse degenerative change lumbar spine with mild scoliosis concave left. No acute or focal bony abnormality. Rounded calcific densities noted over the right lower quadrant. R hip x  ray 03/16/18: negative    Currently in Pain?  Yes    Pain Score  3     Pain Location  Buttocks    Pain Orientation  Right    Pain Descriptors / Indicators  Constant;Sore;Aching    Pain Type  Chronic pain                       [facility] Adult PT Treatment/Exercise - 05/01/18 1529      Exercises   Exercises  Lumbar      Lumbar Exercises: Stretches   Piriformis Stretch  Right;3 reps;30 seconds    Piriformis Stretch Limitations  KTOS    Figure 4 Stretch  3 reps;30 seconds;With overpressure    Figure 4 Stretch Limitations  with towel assistance       Lumbar Exercises: Aerobic   Recumbent Bike  L2 x 6 min      Lumbar Exercises: Supine   Clam  10 reps;3 seconds    Clam Limitations  abd bracing + alt hip ABD/ER with red TB    Bridge  10 reps;5 seconds    Bridge Limitations  + hip ABD isometric with red TB at knees    Bridge with clamshell  10 reps;3 seconds  Bridge with [employer] Limitations  alt hip ABD/ER with red TB      Lumbar Exercises: Sidelying   Clam  Right;10 reps    Clam Limitations  red TB around knees      Modalities   Modalities  Electrical Stimulation;Moist Heat      Moist Heat Therapy   Number Minutes Moist Heat  15 Minutes    Moist Heat Location  Hip R buttocks       Electrical Stimulation   Electrical Stimulation Location  R glutes    Electrical Stimulation Action  IFC    Electrical Stimulation Parameters  80-150 Hz, intensity to pt tol x15'    Electrical Stimulation Goals  Pain      Manual Therapy   Manual Therapy  Soft tissue mobilization;Myofascial release    Manual therapy comments  L sidelying    Soft tissue mobilization  R glutes    Myofascial Release  manual TPR R glut medius & minimus       Trigger Point Dry Needling - 05/01/18 1529    Consent Given?  Yes    Education Handout Provided  Yes    Muscles Treated Lower Body  Gluteus minimus;Gluteus maximus gluteus medius    Gluteus Maximus Response  Twitch response  elicited;Palpable increased muscle length    Gluteus Minimus Response  Twitch response elicited;Palpable increased muscle length             PT Short Term Goals - 04/13/18 1537      PT SHORT TERM GOAL #1   Title  Patient to be independent with inital HEP.    Time  4    Period  Weeks    Status  Achieved        PT Long Term Goals - 04/13/18 1537      PT LONG TERM GOAL #1   Title  Patient to be independent with advanced HEP.    Time  8    Period  Weeks    Status  Partially Met met for current      PT LONG TERM GOAL #2   Title  Patient to demonstrate [facility] Lumbar AROM without pain limiting.     Time  8    Period  Weeks    Status  Partially Met improvements in pain-free motion demonstrated, still mild pain with lumbar extension      PT LONG TERM GOAL #3   Title  Patient to demonstrate proper body mechanics when lifting 25# box.    Time  8    Period  Weeks    Status  On-going demonstrated good body mechanics when lifting 10# object      PT LONG TERM GOAL #4   Title  Patient to report tolerance of 1 hour of walking without pain limiting.     Time  8    Period  Weeks    Status  On-going reports 30-45 min walking without pain      PT LONG TERM GOAL #5   Title  Patient to report 1 full day of work with <=3/10 pain.    Time  8    Period  Weeks    Status  On-going 4-5/10 pain with work today            Plan - 05/01/18 1529    Clinical Impression Statement  Vicke reporting some worsening of R buttock pain after traveling to the beach over the weekend. She had previously expressed interest in DN  to address pain and ongoing tigheness and after informed consent received, performed DN in conjunction with manual therapy to R glutes - positive twitch response elicited with decrease in ttp and muscle tension tension noted following DN. Treatment concluded with review of relavant stretches and exercises to promote maintenance of of normalized muscle tension followed by estim  and moist heat to promote further muscle relaxation. Pt noting ongoing benefit from estim, therefore will look into insurance coverage for home Flex IT TENS unit.    Rehab Potential  Good    PT Treatment/Interventions  ADLs/Self Care Home Management;Cryotherapy;Electrical Stimulation;Iontophoresis 2m/ml Dexamethasone;Moist Heat;Traction;Ultrasound;Gait training;Stair training;Functional mobility training;Therapeutic activities;Therapeutic exercise;Manual techniques;Patient/family education;Neuromuscular re-education;Balance training;Passive range of motion;Dry needling;Energy conservation;Splinting;Taping;Vasopneumatic Device    Consulted and Agree with Plan of Care  Patient       Patient will benefit from skilled therapeutic intervention in order to improve the following deficits and impairments:  Hypomobility, Decreased activity tolerance, Decreased strength, Pain, Decreased balance, Decreased mobility, Difficulty walking, Improper body mechanics, Decreased range of motion, Postural dysfunction  Visit Diagnosis: Chronic midline low back pain with right-sided sciatica  Difficulty in walking, not elsewhere classified  Other symptoms and signs involving the musculoskeletal system     Problem List Patient Active Problem List   Diagnosis Date Noted  . Low back pain radiating to right leg 03/19/2018  . Overactive bladder 05/06/2016  . OSA (obstructive sleep apnea) 03/24/2016  . Migraines 03/23/2016  . Vitamin D deficiency 08/08/2015  . Vitamin B12 deficiency anemia 07/04/2015  . B12 deficiency 05/14/2015  . Hot flashes 05/11/2015  . Preventative health care 05/11/2015  . Asthma 03/24/2015  . GERD (gastroesophageal reflux disease) 03/24/2015  . Anxiety and depression 03/24/2015  . Headache 03/24/2015    JPercival Spanish PT, MPT 05/01/2018, 6:43 PM  [facility] [place] [address]Phone:  [phone]  Fax:  [phone] Name: TLilli DewaldMRN: [id number] of Birth: [birth year]

## 2018-05-03 ENCOUNTER — Telehealth: Payer: Self-pay | Admitting: *Deleted

## 2018-05-03 ENCOUNTER — Ambulatory Visit (INDEPENDENT_AMBULATORY_CARE_PROVIDER_SITE_OTHER): Payer: 59 | Admitting: *Deleted

## 2018-05-03 DIAGNOSIS — E538 Deficiency of other specified B group vitamins: Secondary | ICD-10-CM | POA: Diagnosis not present

## 2018-05-03 MED ORDER — CYANOCOBALAMIN 1000 MCG/ML IJ SOLN
1000.0000 ug | Freq: Once | INTRAMUSCULAR | Status: AC
  Administered 2018-05-03: 1000 ug via INTRAMUSCULAR

## 2018-05-03 NOTE — Telephone Encounter (Signed)
Pt in office today and states that Dr Barbaraann Barthel told her he thought the phleboliths on xray are "nothing to worry about". Pt confused about whether she should proceed with abdominal xrays ordered by PCP. State she has had pelvic u/s done at [facility] in January or February of this year and wonders if that would change current course of action.  Pt has also scheduled an appt for 05/15/18 at 2:40 to further discuss with PCP. Pt signed record release for u/s result and it has been faxed to [facility]. Awaiting result.

## 2018-05-03 NOTE — Progress Notes (Addendum)
Pt here for monthly B12 injection per Debbrah Alar, NP.  B12 1080mcg given IM, left deltoid and pt tolerated injection well.  Next B12 injection scheduled for 06/02/18 at 3:30pm.  Note routed to Doc of the Day in PCP's absence.  Agree with administration of b12.  Mackie Pai, PA-C

## 2018-05-03 NOTE — Telephone Encounter (Signed)
I would recommend repeat abdominal x-rays please.

## 2018-05-03 NOTE — Telephone Encounter (Signed)
Will discuss at upcoming appointment

## 2018-05-04 NOTE — Telephone Encounter (Signed)
Notified pt and she will keep appt on the 29th to discuss.

## 2018-05-08 ENCOUNTER — Encounter: Payer: Self-pay | Admitting: Physical Therapy

## 2018-05-08 ENCOUNTER — Ambulatory Visit: Payer: 59 | Admitting: Physical Therapy

## 2018-05-08 DIAGNOSIS — G8929 Other chronic pain: Secondary | ICD-10-CM | POA: Diagnosis not present

## 2018-05-08 DIAGNOSIS — M5441 Lumbago with sciatica, right side: Principal | ICD-10-CM

## 2018-05-08 DIAGNOSIS — R262 Difficulty in walking, not elsewhere classified: Secondary | ICD-10-CM

## 2018-05-08 DIAGNOSIS — R29898 Other symptoms and signs involving the musculoskeletal system: Secondary | ICD-10-CM

## 2018-05-08 NOTE — Therapy (Signed)
[facility] [address]  [place], [place], [address] Phone: [phone]   Fax:  [phone]  Physical Therapy Treatment  Patient Details  Name: Terri Johnson MRN: [id number] Date of Birth: 1966/01/20 Referring Provider: Karlton Lemon, MD   Encounter Date: 05/08/2018  PT End of Session - 05/08/18 1526    Visit Number  9    Number of Visits  17    Date for PT Re-Evaluation  05/22/18    Authorization Type  [facility]    PT Start Time  1525    PT Stop Time  1623    PT Time Calculation (min)  58 min    Activity Tolerance  Patient tolerated treatment well    Behavior During Therapy  [facility] for tasks assessed/performed       Past Medical History:  Diagnosis Date  . Allergy   . Asthma   . B12 deficiency 05/14/2015  . Depression   . GERD (gastroesophageal reflux disease)   . History of chicken pox   . Migraines   . OSA (obstructive sleep apnea) 03/2016    Past Surgical History:  Procedure Laterality Date  . CESAREAN SECTION  1991  . CHOLECYSTECTOMY  2007  . eardrum repair - left Left   . INNER EAR SURGERY     Pt states she had her "eardrum rebuilt". Has had 8 sets of tubes in ears through [employer]  . MYRINGOTOMY Bilateral    twice each.  . TONSILLECTOMY AND ADENOIDECTOMY  1976  . TUBAL LIGATION  2001    There were no vitals filed for this visit.  Subjective Assessment - 05/08/18 1527    Subjective  Pt noting some initial soreness for the first day after DN last session, but then noted improvment. Some increased pain today after having to stand more than usual.    Pertinent History  L eardrum repair, myringotomy     Diagnostic tests  lumbar xray 03/16/18: Mild diffuse degenerative change lumbar spine with mild scoliosis concave left. No acute or focal bony abnormality. Rounded calcific densities noted over the right lower quadrant. R hip x ray 03/16/18: negative    Currently in Pain?  Yes    Pain Score  2     Pain Location  Buttocks    Pain Orientation  Right    Pain Descriptors / Indicators  Dull;Constant;Aching    Pain Type  Chronic pain                       [facility] Adult PT Treatment/Exercise - 05/08/18 1525      Lumbar Exercises: Aerobic   Nustep  L4 x 6 min      Lumbar Exercises: Sidelying   Clam  Right;10 reps    Clam Limitations  Red TB       Knee/Hip Exercises: Standing   Hip Abduction  Both;Limitations    Abduction Limitations  Walking sideways, 30 ft each direction with Red TB around ankles     Forward Step Up  10 reps;Left    Functional Squat  15 reps;Other (comment)    Functional Squat Limitations  with TRX support; 5 reps normal squat; 10 reps with triple extension     SLS with Vectors  3 x 3 on each LE with emphasis on LE eccentric control     Other Standing Knee Exercises  Monster walks A/P with Red TB around knees; 30 ft each direction  Moist Heat Therapy   Number Minutes Moist Heat  15 Minutes    Moist Heat Location  Hip R buttocks      Electrical Stimulation   Electrical Stimulation Location  R glutes    Electrical Stimulation Action  IFC    Electrical Stimulation Parameters  80-150 Hz, intensity to pt tol x15'    Electrical Stimulation Goals  Pain       Trigger Point Dry Needling - 05/08/18 1525    Consent Given?  Yes    Muscles Treated Lower Body  Gluteus minimus;Gluteus maximus;Piriformis & gluteus medius on R    Gluteus Maximus Response  Twitch response elicited;Palpable increased muscle length    Gluteus Minimus Response  Twitch response elicited;Palpable increased muscle length    Piriformis Response  Twitch response elicited;Palpable increased muscle length             PT Short Term Goals - 04/13/18 1537      PT SHORT TERM GOAL #1   Title  Patient to be independent with inital HEP.    Time  4    Period  Weeks    Status  Achieved        PT Long Term Goals - 04/13/18 1537      PT LONG TERM GOAL #1   Title  Patient  to be independent with advanced HEP.    Time  8    Period  Weeks    Status  Partially Met met for current      PT LONG TERM GOAL #2   Title  Patient to demonstrate [facility] Lumbar AROM without pain limiting.     Time  8    Period  Weeks    Status  Partially Met improvements in pain-free motion demonstrated, still mild pain with lumbar extension      PT LONG TERM GOAL #3   Title  Patient to demonstrate proper body mechanics when lifting 25# box.    Time  8    Period  Weeks    Status  On-going demonstrated good body mechanics when lifting 10# object      PT LONG TERM GOAL #4   Title  Patient to report tolerance of 1 hour of walking without pain limiting.     Time  8    Period  Weeks    Status  On-going reports 30-45 min walking without pain      PT LONG TERM GOAL #5   Title  Patient to report 1 full day of work with <=3/10 pain.    Time  8    Period  Weeks    Status  On-going 4-5/10 pain with work today            Plan - 05/08/18 1647    Clinical Impression Statement  Pt is making progress throughout this episode of therapy, however continues to demonstrate limitations in LE strength with increased pain during functional movements. Trigger point dry needling today with good relief noted from patient. At this time pt will continue to benefit from physical therapy to address ability to tolerate functional activity, LE strength, and decrease pain.     PT Treatment/Interventions  ADLs/Self Care Home Management;Cryotherapy;Electrical Stimulation;Iontophoresis 59m/ml Dexamethasone;Moist Heat;Traction;Ultrasound;Gait training;Stair training;Functional mobility training;Therapeutic activities;Therapeutic exercise;Manual techniques;Patient/family education;Neuromuscular re-education;Balance training;Passive range of motion;Dry needling;Energy conservation;Splinting;Taping;Vasopneumatic Device    Consulted and Agree with Plan of Care  Patient       Patient will benefit from skilled  therapeutic intervention in order to improve  the following deficits and impairments:  Hypomobility, Decreased activity tolerance, Decreased strength, Pain, Decreased balance, Decreased mobility, Difficulty walking, Improper body mechanics, Decreased range of motion, Postural dysfunction  Visit Diagnosis: Chronic midline low back pain with right-sided sciatica  Difficulty in walking, not elsewhere classified  Other symptoms and signs involving the musculoskeletal system     Problem List Patient Active Problem List   Diagnosis Date Noted  . Low back pain radiating to right leg 03/19/2018  . Overactive bladder 05/06/2016  . OSA (obstructive sleep apnea) 03/24/2016  . Migraines 03/23/2016  . Vitamin D deficiency 08/08/2015  . Vitamin B12 deficiency anemia 07/04/2015  . B12 deficiency 05/14/2015  . Hot flashes 05/11/2015  . Preventative health care 05/11/2015  . Asthma 03/24/2015  . GERD (gastroesophageal reflux disease) 03/24/2015  . Anxiety and depression 03/24/2015  . Headache 03/24/2015    Shirline Frees, SPT  05/08/2018, 6:40 PM  [facility] [address]  [place], [place], [address] Phone: [phone]   Fax:  [phone]  Name: Terri Johnson MRN: [id number] Date of Birth: [birth year]

## 2018-05-10 ENCOUNTER — Encounter: Payer: Self-pay | Admitting: Physical Therapy

## 2018-05-10 ENCOUNTER — Ambulatory Visit: Payer: 59 | Admitting: Physical Therapy

## 2018-05-10 DIAGNOSIS — R262 Difficulty in walking, not elsewhere classified: Secondary | ICD-10-CM | POA: Diagnosis not present

## 2018-05-10 DIAGNOSIS — G8929 Other chronic pain: Secondary | ICD-10-CM | POA: Diagnosis not present

## 2018-05-10 DIAGNOSIS — M5441 Lumbago with sciatica, right side: Secondary | ICD-10-CM | POA: Diagnosis not present

## 2018-05-10 DIAGNOSIS — R29898 Other symptoms and signs involving the musculoskeletal system: Secondary | ICD-10-CM | POA: Diagnosis not present

## 2018-05-10 NOTE — Therapy (Signed)
[facility] [address]  [place], [place], [address] Phone: [phone]   Fax:  [phone]  Physical Therapy Progress Note  Patient Details  Name: Terri Johnson MRN: [id number] Date of Birth: 1966/01/17 Referring Provider: Karlton Lemon, MD   Progress Note Reporting Period 03/27/18 to 05/10/18  See note below for Objective Data and Assessment of Progress/Goals.    Encounter Date: 05/10/2018  PT End of Session - 05/10/18 1608    Visit Number  10    Number of Visits  17    Date for PT Re-Evaluation  05/22/18    Authorization Type  [facility]    PT Start Time  1526    PT Stop Time  1617    PT Time Calculation (min)  51 min    Activity Tolerance  Patient tolerated treatment well    Behavior During Therapy  WFL for tasks assessed/performed       Past Medical History:  Diagnosis Date  . Allergy   . Asthma   . B12 deficiency 05/14/2015  . Depression   . GERD (gastroesophageal reflux disease)   . History of chicken pox   . Migraines   . OSA (obstructive sleep apnea) 03/2016    Past Surgical History:  Procedure Laterality Date  . CESAREAN SECTION  1991  . CHOLECYSTECTOMY  2007  . eardrum repair - left Left   . INNER EAR SURGERY     Pt states she had her "eardrum rebuilt". Has had 8 sets of tubes in ears through [employer]  . MYRINGOTOMY Bilateral    twice each.  . TONSILLECTOMY AND ADENOIDECTOMY  1976  . TUBAL LIGATION  2001    There were no vitals filed for this visit.  Subjective Assessment - 05/10/18 1527    Subjective  Reports she woke up with R shoulder and hand pain. Said she has gone to a rheumatologist for this before. Says it is due to the weather. Reports LB and buttock are doing well. Reports 90% improvement since intial eval. Still gets pain in R buttock if sitting for long, tenderness in LB if standing too long.     Pertinent History  L eardrum repair, myringotomy     Diagnostic tests  lumbar  xray 03/16/18: Mild diffuse degenerative change lumbar spine with mild scoliosis concave left. No acute or focal bony abnormality. Rounded calcific densities noted over the right lower quadrant. R hip x ray 03/16/18: negative    Currently in Pain?  Yes    Pain Score  2     Pain Location  Buttocks    Pain Orientation  Right    Pain Descriptors / Indicators  Aching;Constant;Dull    Pain Type  Chronic pain    Multiple Pain Sites  Yes    Pain Score  8    Pain Location  Shoulder R shoulder and finger jts    Pain Orientation  Right    Pain Descriptors / Indicators  Aching         [facility] PT Assessment - 05/10/18 0001      Observation/Other Assessments   Focus on Therapeutic Outcomes (FOTO)   Hip: 66 (34% limited, 39% predicted)      AROM   AROM Assessment Site  Lumbar    Lumbar Flexion  distal shin    Lumbar Extension  mildly restricted    Lumbar - Right Side Bend  jt line    Lumbar - Left Side Bend  jt  line 1/10 pain in R buttock    Lumbar - Right Rotation  [facility]    Lumbar - Left Rotation  WFL 1/10 pain in R buttock                   [facility] Adult PT Treatment/Exercise - 05/10/18 0001      Therapeutic Activites    Therapeutic Activities  Lifting    Lifting  lifting 10, 20, 25# box with squat technique; pt with no c/o pain VCs to widen stance and bring box closed to center of mass      Lumbar Exercises: Aerobic   Nustep  L3x6 min      Knee/Hip Exercises: Standing   Forward Step Up  Right;Left;1 set;10 reps;Hand Hold: 0;Step Height: 6";Limitations    Forward Step Up Limitations  1 ski pole for support; VCs to hit heel first and increase step length unable to tolerate 8" step d/t pain    Other Standing Knee Exercises  sidestepping with red TB around toes; 2x 61f VCs to avoid lateral trunk lean      Knee/Hip Exercises: Seated   Sit to Sand  1 set;10 reps;without UE support weighted orange ball at chest; VCs for slow eccentric lower      Moist Heat Therapy   Number  Minutes Moist Heat  15 Minutes    Moist Heat Location  Hip R buttock      Electrical Stimulation   Electrical Stimulation Location  R glutes    Electrical Stimulation Action  IFC    Electrical Stimulation Parameters  Output: 11 to tolerance; 15 min    Electrical Stimulation Goals  Pain               PT Short Term Goals - 05/10/18 1536      PT SHORT TERM GOAL #1   Title  Patient to be independent with inital HEP.    Time  4    Period  Weeks    Status  Achieved        PT Long Term Goals - 05/10/18 1536      PT LONG TERM GOAL #1   Title  Patient to be independent with advanced HEP.    Time  8    Period  Weeks    Status  Partially Met met for current      PT LONG TERM GOAL #2   Title  [facility] to demonstrate [facility] without pain limiting.     Time  8    Period  Weeks    Status  Partially Met 1/10 pain with L sidebend and rotation; otherwise lumbar AROM WFL      PT LONG TERM GOAL #3   Title  Patient to demonstrate proper body mechanics when lifting 25# box.    Time  8    Period  Weeks    Status  Achieved demonstrated good body mechanics when lifting 25# box with no pain this date      PT LONG TERM GOAL #4   Title  Patient to report tolerance of 1 hour of walking without pain limiting.     Time  8    Period  Weeks    Status  Achieved reports 1 hour walking without pain      PT LONG TERM GOAL #5   Title  Patient to report 1 full day of work with <=3/10 pain.    Time  8    Period  Weeks  Status  Achieved 2-3/10 pain with work on an average day            Plan - 05/10/18 1744    Clinical Impression Statement  Patient arrived to session with c/o R shoulder and finger pain, reporting the change in weather has caused this joint pain and has experienced this before. Reports R buttock and LB pain is much better, noting 90% improvement since initial eval. Reports she still gets pain and tenderness in these areas after prolonged sitting and standing, but  able to manage her pain well. Updated goals this date- patient with [facility] lumbar AROM- only 1/10 pain with L sidebend and L rotation. Patient has met lifting goal, standing tolerance goal, and work tolerance goal at this time. Reports she is happy with her progress and ready to d/c within the coming visits. Patient with good tolerance of lifting 10, 20, 25# box with good form and no pain- intermittent cues required for optimal lifting technique. Also able to perform STS with weighted ball and sidestepping with TB. Patient reporting 2/10 pain after all activity performed this session. Requested moist heat and e-stim to R buttock for pain relief. Normal integumentary response and denied pain at end of session. Patient progressing extremely well and will be ready to d/c within coming visits. Patient in agreement.     PT Treatment/Interventions  ADLs/Self Care Home Management;Cryotherapy;Electrical Stimulation;Iontophoresis 70m/ml Dexamethasone;Moist Heat;Traction;Ultrasound;Gait training;Stair training;Functional mobility training;Therapeutic activities;Therapeutic exercise;Manual techniques;Patient/family education;Neuromuscular re-education;Balance training;Passive range of motion;Dry needling;Energy conservation;Splinting;Taping;Vasopneumatic Device    PT Next Visit Plan  continue with anterior step ups    Consulted and Agree with Plan of Care  Patient       Patient will benefit from skilled therapeutic intervention in order to improve the following deficits and impairments:  Hypomobility, Decreased activity tolerance, Decreased strength, Pain, Decreased balance, Decreased mobility, Difficulty walking, Improper body mechanics, Decreased range of motion, Postural dysfunction  Visit Diagnosis: Chronic midline low back pain with right-sided sciatica  Difficulty in walking, not elsewhere classified  Other symptoms and signs involving the musculoskeletal system     Problem List Patient Active Problem  List   Diagnosis Date Noted  . Low back pain radiating to right leg 03/19/2018  . Overactive bladder 05/06/2016  . OSA (obstructive sleep apnea) 03/24/2016  . Migraines 03/23/2016  . Vitamin D deficiency 08/08/2015  . Vitamin B12 deficiency anemia 07/04/2015  . B12 deficiency 05/14/2015  . Hot flashes 05/11/2015  . Preventative health care 05/11/2015  . Asthma 03/24/2015  . GERD (gastroesophageal reflux disease) 03/24/2015  . Anxiety and depression 03/24/2015  . Headache 03/24/2015    YJanene Harvey PT, DPT 05/10/18 6:00 PM   [facility] [place] [address]Phone: [phone]  Fax:  [phone] Name: TTracey HermanceMRN: [id number] of Birth: [birth year]

## 2018-05-15 ENCOUNTER — Encounter: Payer: Self-pay | Admitting: Family

## 2018-05-15 ENCOUNTER — Encounter: Payer: Self-pay | Admitting: Physical Therapy

## 2018-05-15 ENCOUNTER — Ambulatory Visit: Payer: 59 | Admitting: Physical Therapy

## 2018-05-15 ENCOUNTER — Ambulatory Visit: Payer: 59 | Admitting: Family

## 2018-05-15 VITALS — BP 129/70 | HR 112 | Temp 98.4°F | Resp 16 | Ht 59.0 in | Wt 190.6 lb

## 2018-05-15 DIAGNOSIS — J45909 Unspecified asthma, uncomplicated: Secondary | ICD-10-CM

## 2018-05-15 DIAGNOSIS — E538 Deficiency of other specified B group vitamins: Secondary | ICD-10-CM | POA: Diagnosis not present

## 2018-05-15 DIAGNOSIS — M25551 Pain in right hip: Secondary | ICD-10-CM | POA: Diagnosis not present

## 2018-05-15 DIAGNOSIS — R262 Difficulty in walking, not elsewhere classified: Secondary | ICD-10-CM

## 2018-05-15 DIAGNOSIS — G4733 Obstructive sleep apnea (adult) (pediatric): Secondary | ICD-10-CM

## 2018-05-15 DIAGNOSIS — M5441 Lumbago with sciatica, right side: Principal | ICD-10-CM

## 2018-05-15 DIAGNOSIS — F329 Major depressive disorder, single episode, unspecified: Secondary | ICD-10-CM | POA: Diagnosis not present

## 2018-05-15 DIAGNOSIS — F419 Anxiety disorder, unspecified: Secondary | ICD-10-CM | POA: Diagnosis not present

## 2018-05-15 DIAGNOSIS — G8929 Other chronic pain: Secondary | ICD-10-CM

## 2018-05-15 DIAGNOSIS — R29898 Other symptoms and signs involving the musculoskeletal system: Secondary | ICD-10-CM | POA: Diagnosis not present

## 2018-05-15 DIAGNOSIS — J302 Other seasonal allergic rhinitis: Secondary | ICD-10-CM

## 2018-05-15 NOTE — Therapy (Addendum)
[facility] [address]  [place], [place], [address] Phone: [phone]   Fax:  [phone]  Physical Therapy Treatment  Patient Details  Name: Terri Johnson MRN: [id number] Date of Birth: October 25, 1965 Referring Provider: Karlton Lemon, MD   Encounter Date: 05/15/2018  PT End of Session - 05/15/18 1623    Visit Number  11    Number of Visits  17    Date for PT Re-Evaluation  05/22/18    Authorization Type  [facility]    PT Start Time  1535    PT Stop Time  1630    PT Time Calculation (min)  55 min    Activity Tolerance  Patient tolerated treatment well    Behavior During Therapy  [facility] for tasks assessed/performed       Past Medical History:  Diagnosis Date  . Allergy   . Asthma   . B12 deficiency 05/14/2015  . Depression   . GERD (gastroesophageal reflux disease)   . History of chicken pox   . Migraines   . OSA (obstructive sleep apnea) 03/2016    Past Surgical History:  Procedure Laterality Date  . CESAREAN SECTION  1991  . CHOLECYSTECTOMY  2007  . eardrum repair - left Left   . INNER EAR SURGERY     Pt states she had her "eardrum rebuilt". Has had 8 sets of tubes in ears through [employer]  . MYRINGOTOMY Bilateral    twice each.  . TONSILLECTOMY AND ADENOIDECTOMY  1976  . TUBAL LIGATION  2001    There were no vitals filed for this visit.  Subjective Assessment - 05/15/18 1537    Subjective  Patient reports she has only noticed R buttock pain once today. Feels the best she has felt. Got her EMSI unit today. Reports 100% improvement since initial eval.    Pertinent History  L eardrum repair, myringotomy     Diagnostic tests  lumbar xray 03/16/18: Mild diffuse degenerative change lumbar spine with mild scoliosis concave left. No acute or focal bony abnormality. Rounded calcific densities noted over the right lower quadrant. R hip x ray 03/16/18: negative    Currently in Pain?  No/denies         [facility]  PT Assessment - 05/15/18 0001      Observation/Other Assessments   Focus on Therapeutic Outcomes (FOTO)   Hip: 79 (21% limited, 39% predicted)      AROM   AROM Assessment Site  Lumbar no pain    Lumbar Flexion  distal shin    Lumbar Extension  WFL    Lumbar - Right Side Bend  jt line    Lumbar - Left Side Bend  jt line    Lumbar - Right Rotation  WFL    Lumbar - Left Rotation  [facility]                   [facility] Adult PT Treatment/Exercise - 05/15/18 0001      Lumbar Exercises: Stretches   Passive Hamstring Stretch  Right;2 reps;20 seconds;Limitations    Passive Hamstring Stretch Limitations  supine strap    Piriformis Stretch  Right;20 seconds;2 reps    Piriformis Stretch Limitations  KTOS    Figure 4 Stretch  2 reps;20 seconds;With overpressure      Lumbar Exercises: Standing   Other Standing Lumbar Exercises  Sidestepping with red TB around B forefoot with UE support;       Lumbar Exercises: Supine  Bridge with clamshell  15 reps;Limitations    Bridge with [employer] Limitations  red TB around knees      Knee/Hip Exercises: Stretches   Other Knee/Hip Stretches  R QL stretch in doorway; 2x20 sec to tolerance      Knee/Hip Exercises: Seated   Other Seated Knee/Hip Exercises  sitting B LE hip IR with red TB; 15x      Moist Heat Therapy   Number Minutes Moist Heat  15 Minutes    Moist Heat Location  Hip R      Electrical Stimulation   Electrical Stimulation Location  R glutes    Electrical Stimulation Action  IFC    Electrical Stimulation Parameters  Output: 13 to tolerance    Electrical Stimulation Goals  Pain      Manual Therapy   Manual Therapy  Soft tissue mobilization;Myofascial release    Manual therapy comments  L sidelying    Soft tissue mobilization  R glutes, piriformis, R lateral HS, R QL- TTP and soft tissue restriction in these areas    Myofascial Release  TPR to piriformis and lateral HS             PT Education - 05/15/18 1619     Education Details  Update to HEP    Person(s) Educated  Patient    Methods  Explanation;Demonstration;Tactile cues;Verbal cues;Handout    Comprehension  Returned demonstration;Verbalized understanding       PT Short Term Goals - 05/15/18 1639      PT SHORT TERM GOAL #1   Title  Patient to be independent with inital HEP.    Time  4    Period  Weeks    Status  Achieved        PT Long Term Goals - 05/15/18 1543      PT LONG TERM GOAL #1   Title  Patient to be independent with advanced HEP.    Time  8    Period  Weeks    Status  Achieved reports compliance      PT LONG TERM GOAL #2   Title  Patient to demonstrate [facility] Lumbar AROM without pain limiting.     Time  8    Period  Weeks    Status  Achieved lumbar ROM WFL and no c/o pain this date      PT LONG TERM GOAL #3   Title  Patient to demonstrate proper body mechanics when lifting 25# box.    Time  8    Period  Weeks    Status  Achieved demonstrated good body mechanics when lifting 25# box with no pain this date      PT LONG TERM GOAL #4   Title  Patient to report tolerance of 1 hour of walking without pain limiting.     Time  8    Period  Weeks    Status  Achieved reports 1 hour walking without pain      PT LONG TERM GOAL #5   Title  Patient to report 1 full day of work with <=3/10 pain.    Time  8    Period  Weeks    Status  Achieved 2-3/10 pain with work on an average day            Plan - 05/15/18 1623    Clinical Impression Statement  Patient arrived to session with no new complaints. Reports 100% improvement in R buttock pain since initial eval. Patient  also received EMSI personal TENS unit earlier today. Patient has met all goals at this time- able to tolerate lifting, standing, and perform lumbar AROM without pain. Patient also able to manage pain with self-STM technique and stretching as needed throughout the day. Patient tolerated STM to R glutes, piriformis, R lateral HS, R QL- TTP and soft tissue  restriction in these areas but tolerable. Tolerated hip strengthening exercises- VCs required to avoid lateral trunk lean during sidestepping with banded resistance. Performed QL stretch in doorway with good relief of R sided tightness. Reviewed consolidated HEP list and administered to patient. Also educated patient on sitting posture alignment. Patient reported understanding. Requested e-stim and heat to R buttock at end of session as this provides relief to patient. Normal integumentary response and no pain at conclusion of session. Patient to be placed on 30 day hold d/t meeting all goals at this time. Patient in agreement.     PT Treatment/Interventions  ADLs/Self Care Home Management;Cryotherapy;Electrical Stimulation;Iontophoresis 68m/ml Dexamethasone;Moist Heat;Traction;Ultrasound;Gait training;Stair training;Functional mobility training;Therapeutic activities;Therapeutic exercise;Manual techniques;Patient/family education;Neuromuscular re-education;Balance training;Passive range of motion;Dry needling;Energy conservation;Splinting;Taping;Vasopneumatic Device    PT Next Visit Plan  30 day hold at this time    Consulted and Agree with Plan of Care  Patient       Patient will benefit from skilled therapeutic intervention in order to improve the following deficits and impairments:  Hypomobility, Decreased activity tolerance, Decreased strength, Pain, Decreased balance, Decreased mobility, Difficulty walking, Improper body mechanics, Decreased range of motion, Postural dysfunction  Visit Diagnosis: Chronic midline low back pain with right-sided sciatica  Difficulty in walking, not elsewhere classified  Other symptoms and signs involving the musculoskeletal system     Problem List Patient Active Problem List   Diagnosis Date Noted  . Low back pain radiating to right leg 03/19/2018  . Overactive bladder 05/06/2016  . OSA (obstructive sleep apnea) 03/24/2016  . Migraines 03/23/2016  .  Vitamin D deficiency 08/08/2015  . Vitamin B12 deficiency anemia 07/04/2015  . B12 deficiency 05/14/2015  . Hot flashes 05/11/2015  . Preventative health care 05/11/2015  . Asthma 03/24/2015  . GERD (gastroesophageal reflux disease) 03/24/2015  . Anxiety and depression 03/24/2015  . Headache 03/24/2015    YJanene Harvey PT, DPT 05/15/18 4:44 PM   [facility] [address] [place] [address]Phone: [phone]  Fax:  [phone] Name: TBay JarquinMRN: [id number] of Birth: [birth year] PHYSICAL THERAPY DISCHARGE SUMMARY  Visits from Start of Care: 11  Current functional level related to goals / functional outcomes: See above clinical impression; patient pleased with progress and did not return after being placed on 30 day hold   Remaining deficits: None   Education / Equipment: HEP  Plan: Patient agrees to discharge.  Patient goals were met. Patient is being discharged due to meeting the stated rehab goals.  ?????     YJanene Harvey PT, DPT 06/21/18 9:17 AM

## 2018-05-15 NOTE — Progress Notes (Signed)
Subjective:    Patient ID: Terri Johnson, female    DOB: Feb 09, 1966, 52 y.o.   MRN: [id number]  HPI   Patient is a 52 yr old female who presents today for follow up.  Depression-  Reports mood is good. Sleeping well.  CPAP helps a lot  Asthma- continues albuterol.  Allergic rhinitis- reports stable with singulair   B12 deficiency- continues monthly injection.     Review of Systems See HPI  Past Medical History:  Diagnosis Date  . Allergy   . Asthma   . B12 deficiency 05/14/2015  . Depression   . GERD (gastroesophageal reflux disease)   . History of chicken pox   . Migraines   . OSA (obstructive sleep apnea) 03/2016     Social History   Socioeconomic History  . Marital status: Married    Spouse name: Not on file  . Number of children: Not on file  . Years of education: Not on file  . Highest education level: Not on file  Occupational History  . Not on file  Social Needs  . Financial resource strain: Not on file  . Food insecurity:    Worry: Not on file    Inability: Not on file  . Transportation needs:    Medical: Not on file    Non-medical: Not on file  Tobacco Use  . Smoking status: Never Smoker  . Smokeless tobacco: Never Used  Substance and Sexual Activity  . Alcohol use: No    Alcohol/week: 0.0 oz  . Drug use: No  . Sexual activity: Not on file  Lifestyle  . Physical activity:    Days per week: Not on file    Minutes per session: Not on file  . Stress: Not on file  Relationships  . Social connections:    Talks on phone: Not on file    Gets together: Not on file    Attends religious service: Not on file    Active member of club or organization: Not on file    Attends meetings of clubs or organizations: Not on file    Relationship status: Not on file  . Intimate partner violence:    Fear of current or ex partner: Not on file    Emotionally abused: Not on file    Physically abused: Not on file    Forced sexual activity: Not on file  Other  Topics Concern  . 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    Has associates degree   Enjoys painting, animals, antiquing, shopping, reading, movies    Past Surgical History:  Procedure Laterality Date  . CESAREAN SECTION  1991  . CHOLECYSTECTOMY  2007  . eardrum repair - left Left   . INNER EAR SURGERY     Pt states she had her "eardrum rebuilt". Has had 8 sets of tubes in ears through [employer]  . MYRINGOTOMY Bilateral    twice each.  . TONSILLECTOMY AND ADENOIDECTOMY  1976  . TUBAL LIGATION  2001    Family History  Problem Relation Age of Onset  . Hyperlipidemia Mother   . Heart disease Mother   . Hypertension Mother   . Depression Mother   . Parkinson's disease Father   . Depression Maternal Grandmother   . Heart disease Maternal Grandfather   . Hypertension Maternal Grandfather   . Alcohol abuse Maternal Grandfather   .  AVM Maternal Aunt        congenital  . Aneurysm Maternal Aunt   . Depression Maternal Aunt   . Alcohol abuse Maternal Uncle     Allergies  Allergen Reactions  . Aspirin Other (See Comments)    Gi/ringing ears  . Tetracycline Rash    Rash/throat swells    Current Outpatient Medications on File Prior to Visit  Medication Sig Dispense Refill  . betamethasone valerate ointment (VALISONE) 0.1 % Apply small amount to ear as needed for itching/scaling.    Marland Kitchen buPROPion (WELLBUTRIN XL) 300 MG 24 hr tablet TAKE 1 TABLET BY MOUTH ONCE DAILY 90 tablet 1  . CYANOCOBALAMIN IJ Inject 1,000 mcg as directed every 30 (thirty) days.    . Ergocalciferol (VITAMIN D2) 2000 UNITS TABS Take 1 tablet by mouth daily. Reported on 03/22/2016    . fluticasone (FLONASE) 50 MCG/ACT nasal spray Place 2 sprays into both nostrils daily. 16 g 1  . levocetirizine (XYZAL) 5 MG tablet Take 1 tablet (5 mg total) by mouth every evening. 30 tablet 5  . meloxicam (MOBIC) 7.5 MG tablet Take 1 tablet (7.5 mg  total) by mouth daily. 14 tablet 0  . montelukast (SINGULAIR) 10 MG tablet TAKE 1 TABLET BY MOUTH AT BEDTIME 90 tablet 1  . pantoprazole (PROTONIX) 40 MG tablet Take 1 tablet (40 mg total) by mouth daily. 30 tablet 5  . predniSONE (DELTASONE) 10 MG tablet 6 tabs po day 1, 5 tabs po day 2, 4 tabs po day 3, 3 tabs po day 4, 2 tabs po day 5, 1 tab po day 6 21 tablet 0  . VENTOLIN HFA 108 (90 Base) MCG/ACT inhaler INHALE 2 PUFFS INTO THE LUNGS EVERY 6 HOURS AS NEEDED FOR WHEEZING OR SHORTNESS OF BREATH. 18 g 3   No current facility-administered medications on file prior to visit.     BP 129/70 (BP Location: Right Arm, Patient Position: Sitting, Cuff Size: Large)   Pulse (!) 112   Temp 98.4 F (36.9 C) (Oral)   Resp 16   Ht 4\' 11"  (1.499 m)   Wt 190 lb 9.6 oz (86.5 kg)   LMP 03/23/2014   SpO2 97%   BMI 38.50 kg/m       Objective:   Physical Exam  Constitutional: She appears well-developed and well-nourished.  Cardiovascular: Normal rate, regular rhythm and normal heart sounds.  No murmur heard. Pulmonary/Chest: Effort normal and breath sounds normal. No respiratory distress. She has no wheezes.  Psychiatric: She has a normal mood and affect. Her behavior is normal. Judgment and thought content normal.          Assessment & Plan:  Depression-stable on current medications.  Continue same.  Obstructive sleep apnea-she notes feeling significantly better now that she is on regular CPAP.  Continue same.  Asthma-stable with use of as needed albuterol  B12 deficiency-she continues monthly B12 injections.  Allergic rhinitis-this is stable on Singulair.

## 2018-05-18 ENCOUNTER — Encounter: Payer: 59 | Admitting: Physical Therapy

## 2018-05-23 ENCOUNTER — Encounter: Payer: Self-pay | Admitting: Family

## 2018-05-26 MED FILL — MONTELUKAST SOD 10 MG TAB: 10 | 90 days supply | Qty: 90 | Fill #1

## 2018-06-02 ENCOUNTER — Ambulatory Visit (INDEPENDENT_AMBULATORY_CARE_PROVIDER_SITE_OTHER): Payer: 59

## 2018-06-02 DIAGNOSIS — E538 Deficiency of other specified B group vitamins: Secondary | ICD-10-CM

## 2018-06-02 MED ORDER — CYANOCOBALAMIN 1000 MCG/ML IJ SOLN
1000.0000 ug | Freq: Once | INTRAMUSCULAR | Status: AC
  Administered 2018-06-02: 1000 ug via INTRAMUSCULAR

## 2018-06-02 NOTE — Progress Notes (Signed)
Reviewed and agree.  Zachary Lovins S O'Sullivan NP 

## 2018-06-02 NOTE — Progress Notes (Signed)
Pre visit review using our clinic tool,if applicable. No additional management support is needed unless otherwise documented below in the visit note.   Pt here for monthly B12 injection per order from M. Inda Castle NP.  B12 1038mcg given IM right deltpoid, and pt tolerated injection well.  No complaints voiced.  Next B12 injection scheduled for September 17,2019.

## 2018-06-15 DIAGNOSIS — M25551 Pain in right hip: Secondary | ICD-10-CM | POA: Diagnosis not present

## 2018-06-23 MED FILL — BUPROPION HCL XL 300 MG TAB: 300 | 90 days supply | Qty: 90 | Fill #1

## 2018-07-04 ENCOUNTER — Ambulatory Visit (INDEPENDENT_AMBULATORY_CARE_PROVIDER_SITE_OTHER): Payer: 59

## 2018-07-04 DIAGNOSIS — E538 Deficiency of other specified B group vitamins: Secondary | ICD-10-CM | POA: Diagnosis not present

## 2018-07-04 MED ORDER — CYANOCOBALAMIN 1000 MCG/ML IJ SOLN
1000.0000 ug | Freq: Once | INTRAMUSCULAR | Status: AC
  Administered 2018-07-04: 1000 ug via INTRAMUSCULAR

## 2018-07-04 NOTE — Progress Notes (Signed)
Pre visit review using our clinic tool,if applicable. No additional management support is needed unless otherwise documented below in the visit note.   Pt here for monthly B12 injection per order from M. Osullivan,NP.    B12 1000 mcg given IM left deltoid, pt tolerated injection well.   Next B12 injection scheduled for October . Patient aware.

## 2018-07-16 DIAGNOSIS — M25551 Pain in right hip: Secondary | ICD-10-CM | POA: Diagnosis not present

## 2018-08-03 ENCOUNTER — Ambulatory Visit (INDEPENDENT_AMBULATORY_CARE_PROVIDER_SITE_OTHER): Payer: 59

## 2018-08-03 DIAGNOSIS — E538 Deficiency of other specified B group vitamins: Secondary | ICD-10-CM

## 2018-08-03 MED ORDER — CYANOCOBALAMIN 1000 MCG/ML IJ SOLN
1000.0000 ug | Freq: Once | INTRAMUSCULAR | Status: AC
Start: 2018-08-03 — End: 2018-08-03
  Administered 2018-08-03: 1000 ug via INTRAMUSCULAR

## 2018-08-03 NOTE — Progress Notes (Signed)
Pre visit review using our clinic tool,if applicable. No additional management support is needed unless otherwise documented below in the visit note.   Pt here for monthly B12 injection per order from [facility] NP  B12 1051mcg given IM, and pt tolerated injection well.  Next B12 injection scheduled for 1 month patient aware.

## 2018-08-08 NOTE — Progress Notes (Signed)
Noted. Agree with above.  [place], DO 08/08/18 1:21 PM

## 2018-08-09 DIAGNOSIS — G4733 Obstructive sleep apnea (adult) (pediatric): Secondary | ICD-10-CM | POA: Diagnosis not present

## 2018-08-09 DIAGNOSIS — J45901 Unspecified asthma with (acute) exacerbation: Secondary | ICD-10-CM | POA: Diagnosis not present

## 2018-08-15 DIAGNOSIS — M25551 Pain in right hip: Secondary | ICD-10-CM | POA: Diagnosis not present

## 2018-08-31 ENCOUNTER — Other Ambulatory Visit: Payer: Self-pay | Admitting: Family

## 2018-09-01 MED FILL — MONTELUKAST SOD 10 MG TAB: 10 | 90 days supply | Qty: 90 | Fill #0

## 2018-09-05 ENCOUNTER — Ambulatory Visit (INDEPENDENT_AMBULATORY_CARE_PROVIDER_SITE_OTHER): Payer: 59

## 2018-09-05 DIAGNOSIS — E538 Deficiency of other specified B group vitamins: Secondary | ICD-10-CM | POA: Diagnosis not present

## 2018-09-05 MED ORDER — CYANOCOBALAMIN 1000 MCG/ML IJ SOLN
1000.0000 ug | Freq: Once | INTRAMUSCULAR | Status: AC
  Administered 2018-09-05: 1000 ug via INTRAMUSCULAR

## 2018-09-05 NOTE — Progress Notes (Signed)
Pre visit review using our clinic tool,if applicable. No additional management support is needed unless otherwise documented below in the visit note.   .Pt here for monthly B12 injection per   B12 104mcg given IM left deltoid., and pt tolerated injection well. No complaints voiced.  Next B12 injection scheduled for 1 month.

## 2018-09-06 NOTE — Progress Notes (Signed)
Note reviewed.  Daira Hine S O'Sullivan NP 

## 2018-09-15 DIAGNOSIS — M25551 Pain in right hip: Secondary | ICD-10-CM | POA: Diagnosis not present

## 2018-09-18 DIAGNOSIS — H6123 Impacted cerumen, bilateral: Secondary | ICD-10-CM | POA: Diagnosis not present

## 2018-09-18 DIAGNOSIS — H906 Mixed conductive and sensorineural hearing loss, bilateral: Secondary | ICD-10-CM | POA: Diagnosis not present

## 2018-09-21 ENCOUNTER — Telehealth: Payer: Self-pay | Admitting: *Deleted

## 2018-09-21 MED ORDER — BUPROPION HCL ER (XL) 300 MG PO TB24: 300.0000 mg | ORAL_TABLET | Freq: Every day | ORAL | 1 refills | Status: DC

## 2018-09-21 MED FILL — BUPROPION HCL XL 300 MG TAB: 300 | 90 days supply | Qty: 90 | Fill #0

## 2018-09-21 NOTE — Telephone Encounter (Signed)
Received fax from [facility] for bupropion HCL XL 300mg , once daily. Refill sent.

## 2018-10-05 ENCOUNTER — Ambulatory Visit (INDEPENDENT_AMBULATORY_CARE_PROVIDER_SITE_OTHER): Payer: 59

## 2018-10-05 DIAGNOSIS — E538 Deficiency of other specified B group vitamins: Secondary | ICD-10-CM | POA: Diagnosis not present

## 2018-10-05 MED ORDER — CYANOCOBALAMIN 1000 MCG/ML IJ SOLN
1000.0000 ug | Freq: Once | INTRAMUSCULAR | Status: AC
  Administered 2018-10-05: 1000 ug via INTRAMUSCULAR

## 2018-10-05 NOTE — Progress Notes (Addendum)
Pre visit review using our clinic tool,if applicable. No additional management support is needed unless otherwise documented below in the visit note.   Pt here for monthly B12 injection per   B12 1049mcg given IM right deltoid, and pt tolerated injection well.  Next B12 injection scheduled for 1 month.

## 2018-10-15 DIAGNOSIS — M25551 Pain in right hip: Secondary | ICD-10-CM | POA: Diagnosis not present

## 2018-11-07 ENCOUNTER — Ambulatory Visit (INDEPENDENT_AMBULATORY_CARE_PROVIDER_SITE_OTHER): Payer: 59

## 2018-11-07 DIAGNOSIS — E538 Deficiency of other specified B group vitamins: Secondary | ICD-10-CM | POA: Diagnosis not present

## 2018-11-07 MED ORDER — CYANOCOBALAMIN 1000 MCG/ML IJ SOLN
1000.0000 ug | Freq: Once | INTRAMUSCULAR | Status: AC
  Administered 2018-11-07: 1000 ug via INTRAMUSCULAR

## 2018-11-07 NOTE — Progress Notes (Signed)
Patient came today for her monthly B-12 injections. She tolerated 32mL in her left deltoid with no complications.

## 2018-11-15 DIAGNOSIS — M25551 Pain in right hip: Secondary | ICD-10-CM | POA: Diagnosis not present

## 2018-11-17 ENCOUNTER — Ambulatory Visit: Payer: 59 | Admitting: Family

## 2018-12-08 ENCOUNTER — Ambulatory Visit (INDEPENDENT_AMBULATORY_CARE_PROVIDER_SITE_OTHER): Payer: 59

## 2018-12-08 DIAGNOSIS — E538 Deficiency of other specified B group vitamins: Secondary | ICD-10-CM | POA: Diagnosis not present

## 2018-12-08 MED ORDER — CYANOCOBALAMIN 1000 MCG/ML IJ SOLN
1000.0000 ug | Freq: Once | INTRAMUSCULAR | Status: AC
Start: 2018-12-08 — End: 2018-12-08
  Administered 2018-12-08: 1000 ug via INTRAMUSCULAR

## 2018-12-08 NOTE — Progress Notes (Signed)
Pt here for monthly B12 injection per Melissa O'sullivan  B12 1036mcg given IM in right deltoid and pt tolerated injection well.  Next B12 injection scheduled for next month.   Routed to DOD in absence of PCP.

## 2018-12-08 NOTE — Progress Notes (Signed)
Noted. Agree with above.  Cavalier, DO 12/08/18 2:51 PM

## 2018-12-16 DIAGNOSIS — M25551 Pain in right hip: Secondary | ICD-10-CM | POA: Diagnosis not present

## 2018-12-20 MED FILL — BUPROPION HCL XL 300 MG TAB: 300 | 90 days supply | Qty: 90 | Fill #1

## 2018-12-20 MED FILL — MONTELUKAST SOD 10 MG TAB: 10 | 90 days supply | Qty: 90 | Fill #1

## 2018-12-25 DIAGNOSIS — H16223 Keratoconjunctivitis sicca, not specified as Sjogren's, bilateral: Secondary | ICD-10-CM | POA: Diagnosis not present

## 2018-12-26 MED FILL — CEQUA 0.09 % SOLN: 0.09 | 90 days supply | Qty: 180 | Fill #0

## 2019-01-12 ENCOUNTER — Encounter: Payer: 59 | Admitting: Family

## 2019-01-14 DIAGNOSIS — M25551 Pain in right hip: Secondary | ICD-10-CM | POA: Diagnosis not present

## 2019-01-19 ENCOUNTER — Ambulatory Visit (INDEPENDENT_AMBULATORY_CARE_PROVIDER_SITE_OTHER): Payer: 59

## 2019-01-19 ENCOUNTER — Other Ambulatory Visit: Payer: Self-pay

## 2019-01-19 DIAGNOSIS — E538 Deficiency of other specified B group vitamins: Secondary | ICD-10-CM

## 2019-01-19 MED ORDER — CYANOCOBALAMIN 1000 MCG/ML IJ SOLN
1000.0000 ug | Freq: Once | INTRAMUSCULAR | Status: AC
  Administered 2019-01-19: 1000 ug via INTRAMUSCULAR

## 2019-01-19 NOTE — Progress Notes (Signed)
Pre visit review using our clinic tool,if applicable. No additional management support is needed unless otherwise documented below in the visit note.   Pt here for monthly B12 injection per   B12 1056mcg given IM, and pt tolerated injection well.  Next B12 injection scheduled for

## 2019-01-19 NOTE — Progress Notes (Signed)
LPN note reviewed and agree.  Nance Pear NP

## 2019-02-12 IMAGING — DX DG LUMBAR SPINE 2-3V
3 series · 3 of 3 positions shown · non-contrast
Comparison: No recent prior.

CLINICAL DATA: Pain.  Injury 2 years ago.

EXAM:
LUMBAR SPINE - 2-3 VIEW

[l-spine ap]
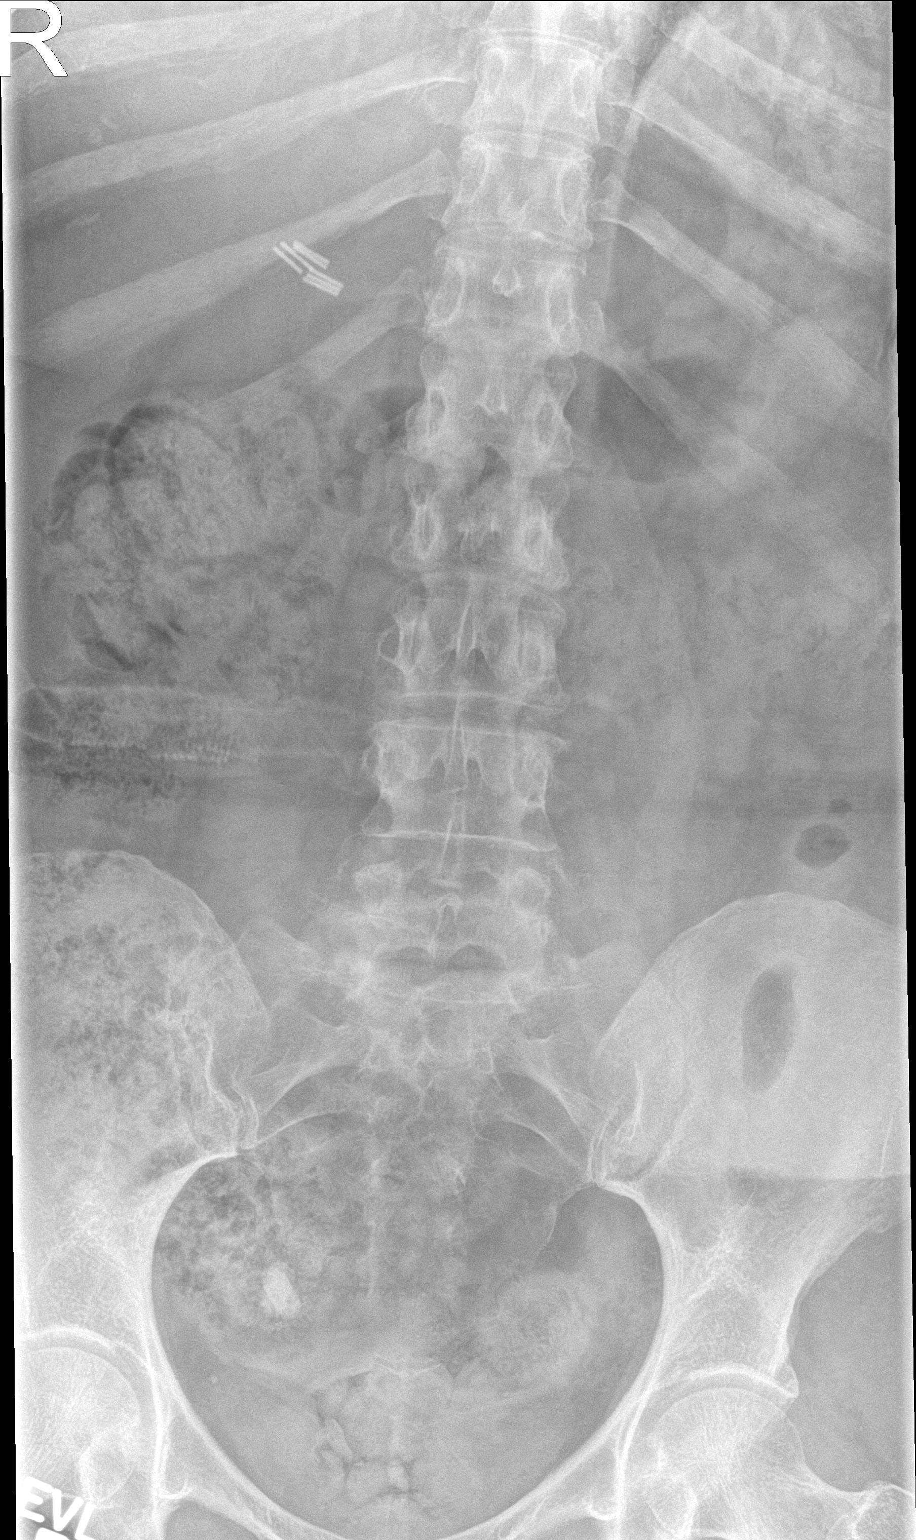

[l-spine lat]
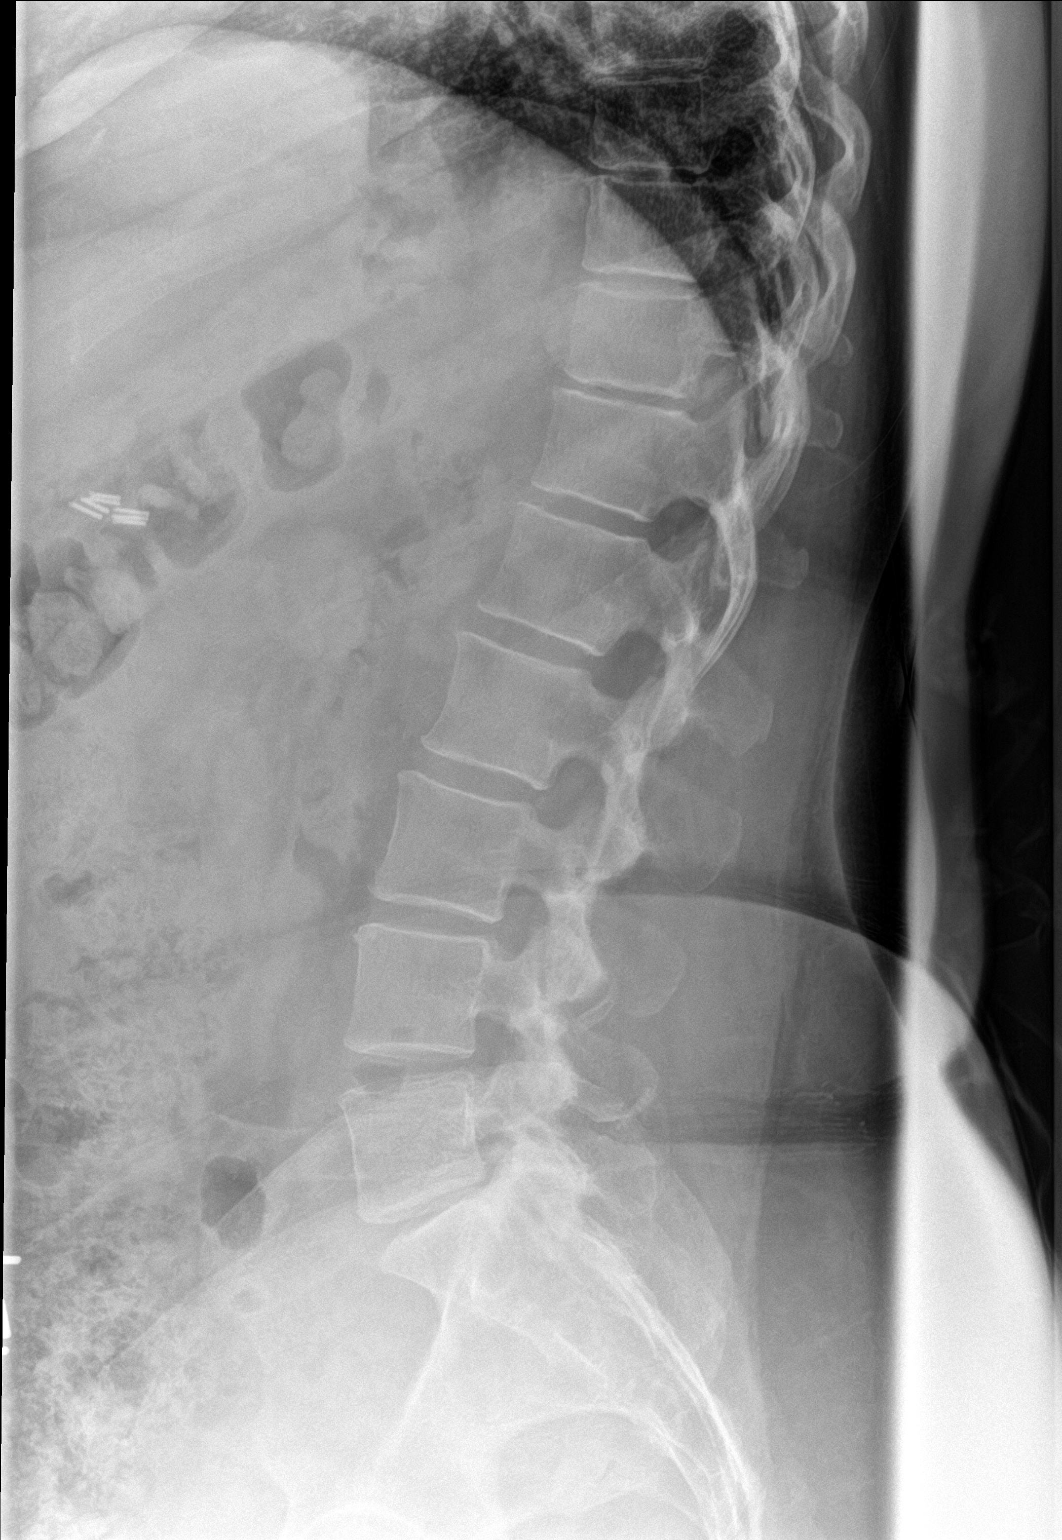

[l-spine spot]
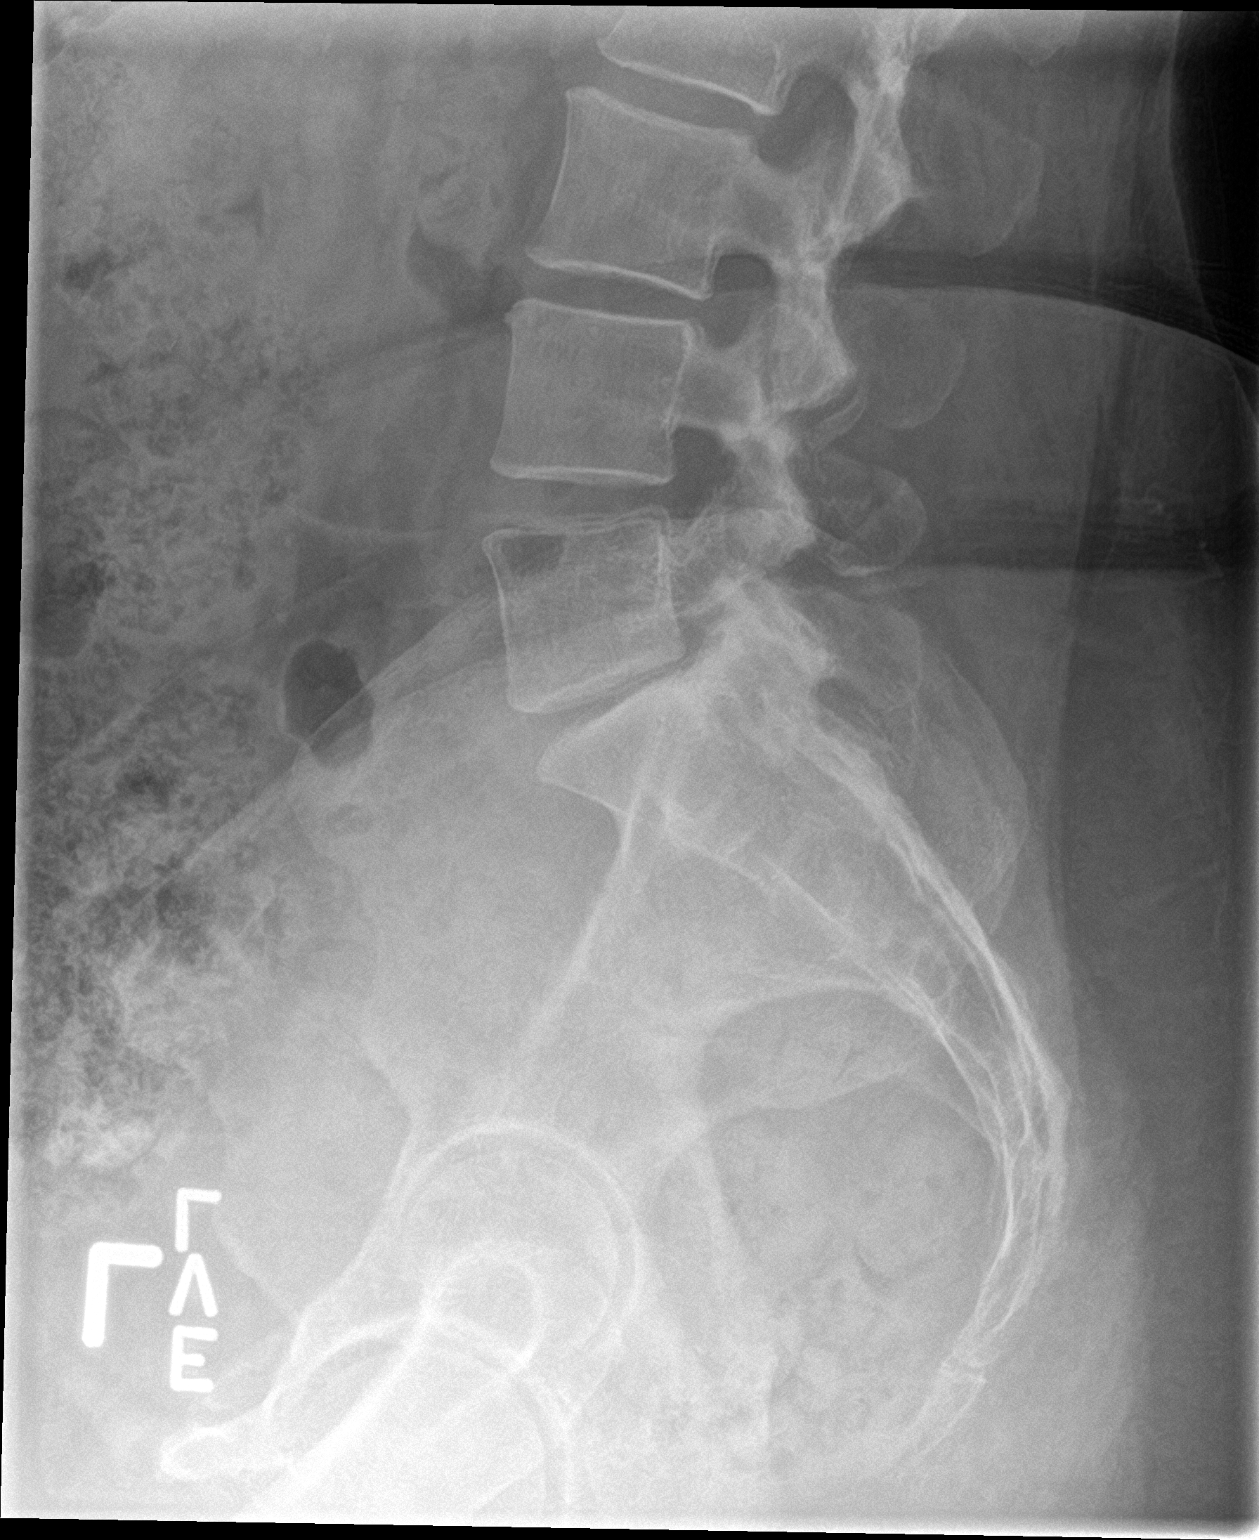

[3 of 3 positions shown; findings below may reference images not displayed]

FINDINGS: Surgical clips right upper quadrant. Pelvic calcifications
consistent with phleboliths. Rounded density in the right pelvis may
represent undigested pill fragments. A calcified adnexal process
cannot be excluded. Follow-up exam suggested to demonstrate
resolution. Prominent amount of stool noted throughout the colon.
Constipation could present in this fashion no bowel distention.

Mild diffuse degenerative change lumbar spine. Mild scoliosis
concave left. No acute bony abnormality.
IMPRESSION: 1. Mild diffuse degenerative change lumbar spine with mild scoliosis
concave left. No acute or focal bony abnormality.

2. Rounded calcific densities noted over the right lower quadrant,
possibly undigested pill fragments. Follow-up exam suggested to
demonstrate resolution to exclude persistent calcified density such
as a calcified adnexal process.

3. Large amount of stool noted throughout the colon. Constipation
cannot be excluded.

## 2019-02-14 DIAGNOSIS — M25551 Pain in right hip: Secondary | ICD-10-CM | POA: Diagnosis not present

## 2019-02-16 ENCOUNTER — Ambulatory Visit: Payer: 59

## 2019-02-22 ENCOUNTER — Other Ambulatory Visit: Payer: Self-pay

## 2019-02-22 ENCOUNTER — Ambulatory Visit (INDEPENDENT_AMBULATORY_CARE_PROVIDER_SITE_OTHER): Payer: 59

## 2019-02-22 DIAGNOSIS — E538 Deficiency of other specified B group vitamins: Secondary | ICD-10-CM | POA: Diagnosis not present

## 2019-02-22 MED ORDER — CYANOCOBALAMIN 1000 MCG/ML IJ SOLN
1000.0000 ug | Freq: Once | INTRAMUSCULAR | Status: AC
  Administered 2019-02-22: 1000 ug via INTRAMUSCULAR

## 2019-02-22 NOTE — Progress Notes (Addendum)
Pre visit review using our clinic tool,if applicable. No additional management support is needed unless otherwise documented below in the visit note.   Pt here for monthly B12 injection per order from M. Inda Castle, NP.  B12 1017mcg given IM right deltoid , and pt tolerated injection well.  Next B12 injection scheduled for June 9,2020 @2 :00   Agree with administration of b12 for low b12.  Mackie Pai, PA-C

## 2019-03-05 ENCOUNTER — Other Ambulatory Visit: Payer: Self-pay | Admitting: Family

## 2019-03-05 ENCOUNTER — Telehealth: Payer: 59 | Admitting: Physician Assistant

## 2019-03-05 ENCOUNTER — Encounter: Payer: Self-pay | Admitting: Physician Assistant

## 2019-03-05 DIAGNOSIS — B9789 Other viral agents as the cause of diseases classified elsewhere: Secondary | ICD-10-CM

## 2019-03-05 DIAGNOSIS — J329 Chronic sinusitis, unspecified: Secondary | ICD-10-CM | POA: Diagnosis not present

## 2019-03-05 MED ORDER — FLUTICASONE PROPIONATE 50 MCG/ACT NA SUSP: 2.0000 | Freq: Every day | NASAL | 6 refills | Status: DC

## 2019-03-05 NOTE — Progress Notes (Signed)
We are sorry that you are not feeling well.  Here is how we plan to help!  Based on what you have shared with me it looks like you have sinusitis.  Sinusitis is inflammation and infection in the sinus cavities of the head.  Based on your presentation I believe you most likely have Acute Viral Sinusitis.This is an infection most likely caused by a virus. There is not specific treatment for viral sinusitis other than to help you with the symptoms until the infection runs its course.  You may use an oral decongestant such as Mucinex D or if you have glaucoma or high blood pressure use plain Mucinex. Saline nasal spray help and can safely be used as often as needed for congestion, I have prescribed: Fluticasone nasal spray two sprays in each nostril once a day    Ms. Kimmet,  Bacterial sinus infection that warrant use of antibiotics are usually diagnosed when symptoms have been for a longer time.Use the prescribed steroid base nasal spray to help with your symptoms.   Some authorities believe that zinc sprays or the use of Echinacea may shorten the course of your symptoms.  Sinus infections are not as easily transmitted as other respiratory infection, however we still recommend that you avoid close contact with loved ones, especially the very young and elderly.  Remember to wash your hands thoroughly throughout the day as this is the number one way to prevent the spread of infection!  Home Care:  Only take medications as instructed by your medical team.  Do not take these medications with alcohol.  A steam or ultrasonic humidifier can help congestion.  You can place a towel over your head and breathe in the steam from hot water coming from a faucet.  Avoid close contacts especially the very young and the elderly.  Cover your mouth when you cough or sneeze.  Always remember to wash your hands.  Get Help Right Away If:  You develop worsening fever or sinus pain.  You develop a severe head  ache or visual changes.  Your symptoms persist after you have completed your treatment plan.  Make sure you  Understand these instructions.  Will watch your condition.  Will get help right away if you are not doing well or get worse.  Your e-visit answers were reviewed by a board certified advanced clinical practitioner to complete your personal care plan.  Depending on the condition, your plan could have included both over the counter or prescription medications.  If there is a problem please reply  once you have received a response from your provider.  Your safety is important to [place].  If you have drug allergies check your prescription carefully.    You can use MyChart to ask questions about today's visit, request a non-urgent call back, or ask for a work or school excuse for 24 hours related to this e-Visit. If it has been greater than 24 hours you will need to follow up with your provider, or enter a new e-Visit to address those concerns.  You will get an e-mail in the next two days asking about your experience.  I hope that your e-visit has been valuable and will speed your recovery. Thank you for using e-visits.   I have spent 7 min in completion and review of this note- Lacy Duverney [facility]

## 2019-03-16 MED FILL — buPROPion HCL ER (XL) 300 M: 300 | 90 days supply | Qty: 90 | Fill #0

## 2019-03-16 MED FILL — MONTELUKAST SOD 10 MG TAB: 10 | 90 days supply | Qty: 90 | Fill #0

## 2019-03-27 ENCOUNTER — Other Ambulatory Visit: Payer: Self-pay

## 2019-03-27 ENCOUNTER — Ambulatory Visit (INDEPENDENT_AMBULATORY_CARE_PROVIDER_SITE_OTHER): Payer: 59

## 2019-03-27 DIAGNOSIS — E538 Deficiency of other specified B group vitamins: Secondary | ICD-10-CM | POA: Diagnosis not present

## 2019-03-27 MED ORDER — CYANOCOBALAMIN 1000 MCG/ML IJ SOLN
1000.0000 ug | Freq: Once | INTRAMUSCULAR | Status: AC
  Administered 2019-03-27: 1000 ug via INTRAMUSCULAR

## 2019-03-27 NOTE — Progress Notes (Signed)
Terri Shankland S O'Sullivan NP 

## 2019-03-27 NOTE — Progress Notes (Signed)
Pt here for monthly B12 injection per Melissa O'sullivan  B12 1093mcg given IM in left deltoid, and pt tolerated injection well.  Next B12 injection scheduled for 04/24/2019

## 2019-04-06 ENCOUNTER — Telehealth: Payer: Self-pay

## 2019-04-06 ENCOUNTER — Ambulatory Visit (INDEPENDENT_AMBULATORY_CARE_PROVIDER_SITE_OTHER): Payer: 59 | Admitting: Family

## 2019-04-06 ENCOUNTER — Other Ambulatory Visit: Payer: Self-pay

## 2019-04-06 DIAGNOSIS — R9389 Abnormal findings on diagnostic imaging of other specified body structures: Secondary | ICD-10-CM

## 2019-04-06 DIAGNOSIS — M5441 Lumbago with sciatica, right side: Secondary | ICD-10-CM | POA: Diagnosis not present

## 2019-04-06 MED ORDER — METHYLPREDNISOLONE 4 MG PO TBPK: ORAL_TABLET | ORAL | 0 refills | Status: DC

## 2019-04-06 MED FILL — METHYLPREDNISOLONE 4 MG TBP: 4 | 6 days supply | Qty: 21 | Fill #0

## 2019-04-06 NOTE — Telephone Encounter (Signed)
Copied from [place] [id number]. Topic: Appointment Scheduling - Scheduling Inquiry for Clinic >> Apr 06, 2019  9:29 AM Scherrie Gerlach wrote: Reason for CRM: pt states she is having back and hip pain again.  She moved wrong yesterday.  Requesting a call back to be seen. Unable to reach office NA. Pls call to schedule appt

## 2019-04-06 NOTE — Telephone Encounter (Signed)
Called patient back , was scheduled for today at 1:20 pm

## 2019-04-06 NOTE — Progress Notes (Signed)
Virtual Visit via Video Note  I connected with Terri Johnson on 04/06/19 at  1:20 PM EDT by a video enabled telemedicine application and verified that I am speaking with the correct person using two identifiers.  Location: Patient: home Provider: office   I discussed the limitations of evaluation and management by telemedicine and the availability of in person appointments. The patient expressed understanding and agreed to proceed.  History of Present Illness:  Patient is a 53 yr old female who presents today with chief complaint of low back pain. She did have an x-ray performed of her lumbar spine which noted mild diffuse degenerative changes and mild scoliosis.  Incidental finding of some calcific densities in the right lower quadrant- ? Undigested pill fragments.    Reports pain is at the base of her tailbone and is radiating down the right hip. Has some right sided foot numbness today as well.  She reports that she took some tylenol and uses a tens unit.  Reports that she is scared to move a certain way because she is afraid "it will catch a certain way." Denies gait abnormality, Leg weakness, bowel or bladder incontinence.  Past Medical History:  Diagnosis Date  . Allergy   . Asthma   . B12 deficiency 05/14/2015  . Depression   . GERD (gastroesophageal reflux disease)   . History of chicken pox   . Migraines   . OSA (obstructive sleep apnea) 03/2016     Social History   Socioeconomic History  . Marital status: Married    Spouse name: Not on file  . Number of children: Not on file  . Years of education: Not on file  . Highest education level: Not on file  Occupational History  . Not on file  Social Needs  . Financial resource strain: Not on file  . Food insecurity    Worry: Not on file    Inability: Not on file  . Transportation needs    Medical: Not on file    Non-medical: Not on file  Tobacco Use  . Smoking status: Never Smoker  . Smokeless tobacco: Never Used   Substance and Sexual Activity  . Alcohol use: No    Alcohol/week: 0.0 standard drinks  . Drug use: No  . Sexual activity: Not on file  Lifestyle  . Physical activity    Days per week: Not on file    Minutes per session: Not on file  . Stress: Not on file  Relationships  . Social Herbalist on phone: Not on file    Gets together: Not on file    Attends religious service: Not on file    Active member of club or organization: Not on file    Attends meetings of clubs or organizations: Not on file    Relationship status: Not on file  . Intimate partner violence    Fear of current or ex partner: Not on file    Emotionally abused: Not on file    Physically abused: Not on file    Forced sexual activity: Not on file  Other Topics Concern  . 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    Has associates degree   Enjoys painting, animals, antiquing, shopping, reading, movies    Past Surgical History:  Procedure Laterality Date  . CESAREAN SECTION  1991  . CHOLECYSTECTOMY  2007  .  eardrum repair - left Left   . INNER EAR SURGERY     Pt states she had her "eardrum rebuilt". Has had 8 sets of tubes in ears through adolecense  . MYRINGOTOMY Bilateral    twice each.  . TONSILLECTOMY AND ADENOIDECTOMY  1976  . TUBAL LIGATION  2001    Family History  Problem Relation Age of Onset  . Hyperlipidemia Mother   . Heart disease Mother   . Hypertension Mother   . Depression Mother   . Parkinson's disease Father   . Depression Maternal Grandmother   . Heart disease Maternal Grandfather   . Hypertension Maternal Grandfather   . Alcohol abuse Maternal Grandfather   . AVM Maternal Aunt        congenital  . Aneurysm Maternal Aunt   . Depression Maternal Aunt   . Alcohol abuse Maternal Uncle     Allergies  Allergen Reactions  . Aspirin Other (See Comments)    Gi/ringing ears  . Tetracycline Rash     Rash/throat swells    Current Outpatient Medications on File Prior to Visit  Medication Sig Dispense Refill  . betamethasone valerate ointment (VALISONE) 0.1 % Apply small amount to ear as needed for itching/scaling.    Marland Kitchen buPROPion (WELLBUTRIN XL) 300 MG 24 hr tablet TAKE 1 TABLET (300 MG TOTAL) BY MOUTH DAILY. 90 tablet 1  . CYANOCOBALAMIN IJ Inject 1,000 mcg as directed every 30 (thirty) days.    . Ergocalciferol (VITAMIN D2) 2000 UNITS TABS Take 1 tablet by mouth daily. Reported on 03/22/2016    . fluticasone (FLONASE) 50 MCG/ACT nasal spray Place 2 sprays into both nostrils daily. 16 g 1  . fluticasone (FLONASE) 50 MCG/ACT nasal spray Place 2 sprays into both nostrils daily. 16 g 6  . levocetirizine (XYZAL) 5 MG tablet Take 1 tablet (5 mg total) by mouth every evening. 30 tablet 5  . meloxicam (MOBIC) 7.5 MG tablet Take 1 tablet (7.5 mg total) by mouth daily. 14 tablet 0  . montelukast (SINGULAIR) 10 MG tablet TAKE 1 TABLET BY MOUTH AT BEDTIME 90 tablet 1  . pantoprazole (PROTONIX) 40 MG tablet Take 1 tablet (40 mg total) by mouth daily. 30 tablet 5  . predniSONE (DELTASONE) 10 MG tablet 6 tabs po day 1, 5 tabs po day 2, 4 tabs po day 3, 3 tabs po day 4, 2 tabs po day 5, 1 tab po day 6 21 tablet 0  . VENTOLIN HFA 108 (90 Base) MCG/ACT inhaler INHALE 2 PUFFS INTO THE LUNGS EVERY 6 HOURS AS NEEDED FOR WHEEZING OR SHORTNESS OF BREATH. 18 g 3   No current facility-administered medications on file prior to visit.     LMP 03/23/2014     Observations/Objective:   Gen: Awake, alert, no acute distress Resp: Breathing is even and non-labored Psych: calm/pleasant demeanor Neuro: Alert and Oriented x 3, + facial symmetry, speech is clear.  Assessment and Plan:  Low back pain with right sided sciatica- will rx with medrol dose pak. Pt is advised to call if new/worsening symptoms or if symptoms fail to improve.   Abnormal x-ray- advised pt to follow through with follow up x-ray to ensure  resolution of calcifications noted on lumbar x-ray.  We ordered this last year but she did not complete.   Follow Up Instructions:    I discussed the assessment and treatment plan with the patient. The patient was provided an opportunity to ask questions and all were answered. The patient agreed  with the plan and demonstrated an understanding of the instructions.   The patient was advised to call back or seek an in-person evaluation if the symptoms worsen or if the condition fails to improve as anticipated.  Nance Pear, NP

## 2019-04-09 ENCOUNTER — Ambulatory Visit (HOSPITAL_BASED_OUTPATIENT_CLINIC_OR_DEPARTMENT_OTHER)
Admission: RE | Admit: 2019-04-09 | Discharge: 2019-04-09 | Disposition: A | Discharge status: Home / Self Care | Payer: 59 | Source: Ambulatory Visit | Attending: Family | Admitting: Family

## 2019-04-09 ENCOUNTER — Other Ambulatory Visit (HOSPITAL_BASED_OUTPATIENT_CLINIC_OR_DEPARTMENT_OTHER): Payer: Self-pay | Admitting: Family

## 2019-04-09 ENCOUNTER — Other Ambulatory Visit: Payer: Self-pay

## 2019-04-09 DIAGNOSIS — R9389 Abnormal findings on diagnostic imaging of other specified body structures: Secondary | ICD-10-CM | POA: Diagnosis not present

## 2019-04-09 DIAGNOSIS — Z1231 Encounter for screening mammogram for malignant neoplasm of breast: Secondary | ICD-10-CM

## 2019-04-09 DIAGNOSIS — K5900 Constipation, unspecified: Secondary | ICD-10-CM | POA: Diagnosis not present

## 2019-04-09 DIAGNOSIS — G4733 Obstructive sleep apnea (adult) (pediatric): Secondary | ICD-10-CM | POA: Diagnosis not present

## 2019-04-09 DIAGNOSIS — J45901 Unspecified asthma with (acute) exacerbation: Secondary | ICD-10-CM | POA: Diagnosis not present

## 2019-04-10 ENCOUNTER — Encounter: Payer: Self-pay | Admitting: Family

## 2019-04-24 ENCOUNTER — Encounter (HOSPITAL_BASED_OUTPATIENT_CLINIC_OR_DEPARTMENT_OTHER): Payer: Self-pay

## 2019-04-24 ENCOUNTER — Ambulatory Visit (HOSPITAL_BASED_OUTPATIENT_CLINIC_OR_DEPARTMENT_OTHER)
Admission: RE | Admit: 2019-04-24 | Discharge: 2019-04-24 | Disposition: A | Discharge status: Home / Self Care | Payer: 59 | Source: Ambulatory Visit | Attending: Family | Admitting: Family

## 2019-04-24 ENCOUNTER — Ambulatory Visit (INDEPENDENT_AMBULATORY_CARE_PROVIDER_SITE_OTHER): Payer: 59

## 2019-04-24 ENCOUNTER — Other Ambulatory Visit: Payer: Self-pay

## 2019-04-24 DIAGNOSIS — E538 Deficiency of other specified B group vitamins: Secondary | ICD-10-CM | POA: Diagnosis not present

## 2019-04-24 DIAGNOSIS — Z1231 Encounter for screening mammogram for malignant neoplasm of breast: Secondary | ICD-10-CM | POA: Insufficient documentation

## 2019-04-24 MED ORDER — CYANOCOBALAMIN 1000 MCG/ML IJ SOLN
1000.0000 ug | Freq: Once | INTRAMUSCULAR | Status: AC
  Administered 2019-04-24: 15:00:00 1000 ug via INTRAMUSCULAR

## 2019-04-24 NOTE — Progress Notes (Signed)
Pre visit review using our clinic review tool, if applicable. No additional management support is needed unless otherwise documented below in the visit note.  Pt here today for B12 injection. Cyanocobalamin 55mL injected into R deltoid. Pt tolerated injection well.   Next B12 in 1 month.

## 2019-04-25 NOTE — Progress Notes (Signed)
Reviewed. ° ° °Terri Johnson S O'Sullivan NP °

## 2019-05-08 ENCOUNTER — Other Ambulatory Visit: Payer: Self-pay | Admitting: Family

## 2019-05-09 MED FILL — ALBUTEROL SULFATE HFA 108 (: 108 (90 BAS | 25 days supply | Qty: 18 | Fill #0

## 2019-05-22 ENCOUNTER — Ambulatory Visit: Payer: 59

## 2019-05-23 MED FILL — CEQUA 0.09 % SOLN: 0.09 | 90 days supply | Qty: 180 | Fill #1

## 2019-05-25 ENCOUNTER — Ambulatory Visit (INDEPENDENT_AMBULATORY_CARE_PROVIDER_SITE_OTHER): Payer: 59 | Admitting: Family

## 2019-05-25 ENCOUNTER — Other Ambulatory Visit: Payer: Self-pay

## 2019-05-25 ENCOUNTER — Encounter: Payer: Self-pay | Admitting: Family

## 2019-05-25 VITALS — BP 127/74 | HR 100 | Temp 98.5°F | Resp 16 | Ht 59.0 in | Wt 175.2 lb

## 2019-05-25 DIAGNOSIS — Z23 Encounter for immunization: Secondary | ICD-10-CM

## 2019-05-25 DIAGNOSIS — E538 Deficiency of other specified B group vitamins: Secondary | ICD-10-CM | POA: Diagnosis not present

## 2019-05-25 DIAGNOSIS — Z0000 Encounter for general adult medical examination without abnormal findings: Secondary | ICD-10-CM | POA: Diagnosis not present

## 2019-05-25 LAB — CBC WITH DIFFERENTIAL/PLATELET
Basophils Absolute: 0 10*3/uL (ref 0.0–0.1)
Basophils Relative: 0.6 % (ref 0.0–3.0)
Eosinophils Absolute: 0.3 10*3/uL (ref 0.0–0.7)
Eosinophils Relative: 4.1 % (ref 0.0–5.0)
HCT: 36.3 % (ref 36.0–46.0)
Hemoglobin: 12.1 g/dL (ref 12.0–15.0)
Lymphocytes Relative: 30.7 % (ref 12.0–46.0)
Lymphs Abs: 1.9 10*3/uL (ref 0.7–4.0)
MCHC: 33.3 g/dL (ref 30.0–36.0)
MCV: 93.8 fl (ref 78.0–100.0)
Monocytes Absolute: 0.5 10*3/uL (ref 0.1–1.0)
Monocytes Relative: 7.7 % (ref 3.0–12.0)
Neutro Abs: 3.6 10*3/uL (ref 1.4–7.7)
Neutrophils Relative %: 56.9 % (ref 43.0–77.0)
Platelets: 273 10*3/uL (ref 150.0–400.0)
RBC: 3.87 Mil/uL (ref 3.87–5.11)
RDW: 13.7 % (ref 11.5–15.5)
WBC: 6.3 10*3/uL (ref 4.0–10.5)

## 2019-05-25 LAB — LIPID PANEL
Cholesterol: 214 mg/dL — ABNORMAL HIGH (ref 0–200)
HDL: 47.8 mg/dL (ref >39.00)
NonHDL: 166.4
Total CHOL/HDL Ratio: 4
Triglycerides: 207 mg/dL — ABNORMAL HIGH (ref 0.0–149.0)
VLDL: 41.4 mg/dL — ABNORMAL HIGH (ref 0.0–40.0)

## 2019-05-25 LAB — BASIC METABOLIC PANEL
BUN: 17 mg/dL (ref 6–23)
CO2: 26 mEq/L (ref 19–32)
Calcium: 10 mg/dL (ref 8.4–10.5)
Chloride: 105 mEq/L (ref 96–112)
Creatinine, Ser: 0.82 mg/dL (ref 0.40–1.20)
GFR: 72.97 mL/min (ref >60.00)
Glucose, Bld: 81 mg/dL (ref 70–99)
Potassium: 4 mEq/L (ref 3.5–5.1)
Sodium: 140 mEq/L (ref 135–145)

## 2019-05-25 LAB — HEPATIC FUNCTION PANEL
ALT: 33 U/L (ref 0–35)
AST: 23 U/L (ref 0–37)
Albumin: 4.6 g/dL (ref 3.5–5.2)
Alkaline Phosphatase: 88 U/L (ref 39–117)
Bilirubin, Direct: 0.1 mg/dL (ref 0.0–0.3)
Total Bilirubin: 0.3 mg/dL (ref 0.2–1.2)
Total Protein: 6.9 g/dL (ref 6.0–8.3)

## 2019-05-25 LAB — LDL CHOLESTEROL, DIRECT: Direct LDL: 142 mg/dL

## 2019-05-25 LAB — TSH: TSH: 1.81 u[IU]/mL (ref 0.35–4.50)

## 2019-05-25 MED ORDER — B-12 1000 MCG/ML IJ KIT: 1.0000 mL | PACK | INTRAMUSCULAR | 3 refills | Status: DC

## 2019-05-25 MED ORDER — "SYRINGE 23G X 1"" 3 ML MISC": 0 refills | Status: DC

## 2019-05-25 MED FILL — CYANOCOBALAMIN 1,000 MCG/ML: 1000 | 90 days supply | Qty: 3 | Fill #0

## 2019-05-25 NOTE — Patient Instructions (Signed)
Please complete lab work prior to leaving. Continue your work with healthy diet, exercise and weight loss.

## 2019-05-25 NOTE — Progress Notes (Signed)
Subjective:    Patient ID: Terri Johnson, female    DOB: [birth year], 53 y.o.   MRN: [id number]  HPI  Patient presents today for complete physical.  Immunizations: Shingrix #1 today,  Tetanus up to date Diet healthy, doing weight watchers Wt Readings from Last 3 Encounters:  05/25/19 175 lb 3.2 oz (79.5 kg)  05/15/18 190 lb 9.6 oz (86.5 kg)  05/01/18 180 lb (81.6 kg)  Exercise: exercising, stationary bike Colonoscopy:  Neg cologuard 2019 Dexa: normal 2018 Pap Smear: due , pt [facility] Mammogram: 2020 Dental: up to date Vision:up to date   Review of Systems  HENT: Negative for hearing loss and rhinorrhea.   Eyes: Negative for visual disturbance.  Respiratory: Negative for cough.   Cardiovascular: Negative for leg swelling.  Gastrointestinal: Negative for constipation and diarrhea.  Genitourinary: Negative for dysuria, frequency and hematuria.  Musculoskeletal: Negative for arthralgias and myalgias.  Skin: Negative for rash.  Neurological: Negative for headaches.  Hematological: Negative for adenopathy.  Psychiatric/Behavioral:       Denies depression/anxiety   Past Medical History:  Diagnosis Date  . Allergy   . Asthma   . B12 deficiency 05/14/2015  . Depression   . GERD (gastroesophageal reflux disease)   . History of chicken pox   . Migraines   . OSA (obstructive sleep apnea) 03/2016     Social History   Socioeconomic History  . Marital status: Married    Spouse name: Not on file  . Number of children: Not on file  . Years of education: Not on file  . Highest education level: Not on file  Occupational History  . Not on file  Social Needs  . Financial resource strain: Not on file  . Food insecurity    Worry: Not on file    Inability: Not on file  . Transportation needs    Medical: Not on file    Non-medical: Not on file  Tobacco Use  . Smoking status: Never Smoker  . Smokeless tobacco: Never Used  Substance and  Sexual Activity  . Alcohol use: No    Alcohol/week: 0.0 standard drinks  . Drug use: No  . Sexual activity: Not on file  Lifestyle  . Physical activity    Days per week: Not on file    Minutes per session: Not on file  . Stress: Not on file  Relationships  . Social Herbalist on phone: Not on file    Gets together: Not on file    Attends religious service: Not on file    Active member of club or organization: Not on file    Attends meetings of clubs or organizations: Not on file    Relationship status: Not on file  . Intimate partner violence    Fear of current or ex partner: Not on file    Emotionally abused: Not on file    Physically abused: Not on file    Forced sexual activity: Not on file  Other Topics Concern  . 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    Has associates degree   Enjoys painting, animals, antiquing, shopping, reading, movies    Past Surgical History:  Procedure Laterality Date  . CESAREAN SECTION  1991  . CHOLECYSTECTOMY  2007  . eardrum repair - left Left   . INNER EAR  SURGERY     Pt states she had her "eardrum rebuilt". Has had 8 sets of tubes in ears through adolecense  . MYRINGOTOMY Bilateral    twice each.  . TONSILLECTOMY AND ADENOIDECTOMY  1976  . TUBAL LIGATION  2001    Family History  Problem Relation Age of Onset  . Hyperlipidemia Mother   . Heart disease Mother   . Hypertension Mother   . Depression Mother   . Parkinson's disease Father   . Depression Maternal Grandmother   . Heart disease Maternal Grandfather   . Hypertension Maternal Grandfather   . Alcohol abuse Maternal Grandfather   . AVM Maternal Aunt        congenital  . Aneurysm Maternal Aunt   . Depression Maternal Aunt   . Alcohol abuse Maternal Uncle     Allergies  Allergen Reactions  . Aspirin Other (See Comments)    Gi/ringing ears  . Tetracycline Rash    Rash/throat  swells    Current Outpatient Medications on File Prior to Visit  Medication Sig Dispense Refill  . betamethasone valerate ointment (VALISONE) 0.1 % Apply small amount to ear as needed for itching/scaling.    Marland Kitchen buPROPion (WELLBUTRIN XL) 300 MG 24 hr tablet TAKE 1 TABLET (300 MG TOTAL) BY MOUTH DAILY. 90 tablet 1  . CYANOCOBALAMIN IJ Inject 1,000 mcg as directed every 30 (thirty) days.    . Ergocalciferol (VITAMIN D2) 2000 UNITS TABS Take 1 tablet by mouth daily. Reported on 03/22/2016    . fluticasone (FLONASE) 50 MCG/ACT nasal spray Place 2 sprays into both nostrils daily. 16 g 1  . fluticasone (FLONASE) 50 MCG/ACT nasal spray Place 2 sprays into both nostrils daily. 16 g 6  . levocetirizine (XYZAL) 5 MG tablet Take 1 tablet (5 mg total) by mouth every evening. 30 tablet 5  . meloxicam (MOBIC) 7.5 MG tablet Take 1 tablet (7.5 mg total) by mouth daily. 14 tablet 0  . methylPREDNISolone (MEDROL DOSEPAK) 4 MG TBPK tablet Take per package instructions 21 tablet 0  . montelukast (SINGULAIR) 10 MG tablet TAKE 1 TABLET BY MOUTH AT BEDTIME 90 tablet 1  . pantoprazole (PROTONIX) 40 MG tablet Take 1 tablet (40 mg total) by mouth daily. 30 tablet 5  . VENTOLIN HFA 108 (90 Base) MCG/ACT inhaler INHALE 2 PUFFS INTO THE LUNGS EVERY 6 HOURS AS NEEDED FOR WHEEZING OR SHORTNESS OF BREATH. 18 g 1   No current facility-administered medications on file prior to visit.     BP 127/74   Pulse 100   Temp 98.5 F (36.9 C) (Oral)   Resp 16   Ht 4\' 11"  (1.499 m)   Wt 175 lb 3.2 oz (79.5 kg)   LMP 03/23/2014   SpO2 99%   BMI 35.39 kg/m       Objective:   Physical Exam  Physical Exam  Constitutional: She is oriented to person, place, and time. She appears well-developed and well-nourished. No distress.  HENT:  Head: Normocephalic and atraumatic.  Right Ear: Tympanic membrane and ear canal normal.  Left Ear: Tympanic membrane and ear canal normal.  Mouth/Throat: not examined wearing mask Eyes: Pupils  are equal, round, and reactive to light. No scleral icterus.  Neck: Normal range of motion. No thyromegaly present.  Cardiovascular: Normal rate and regular rhythm.   No murmur heard. Pulmonary/Chest: Effort normal and breath sounds normal. No respiratory distress. He has no wheezes. She has no rales. She exhibits no tenderness.  Abdominal: Soft. Bowel sounds  are normal. She exhibits no distension and no mass. There is no tenderness. There is no rebound and no guarding.  Musculoskeletal: She exhibits no edema.  Lymphadenopathy:    She has no cervical adenopathy.  Neurological: She is alert and oriented to person, place, and time. She has normal patellar reflexes. She exhibits normal muscle tone. Coordination normal.  Skin: Skin is warm and dry.  Psychiatric: She has a normal mood and affect. Her behavior is normal. Judgment and thought content normal.     Assessment & Plan:  Preventative care- discussed healthy diet, exercise and weight loss. Shingrix #1 given. She will call her GYN to schedule a Pap. Mammogram/cologuard up to date.         Assessment & Plan:

## 2019-05-28 ENCOUNTER — Encounter: Payer: Self-pay | Admitting: Family

## 2019-05-28 DIAGNOSIS — E538 Deficiency of other specified B group vitamins: Secondary | ICD-10-CM | POA: Diagnosis not present

## 2019-05-28 DIAGNOSIS — Z0000 Encounter for general adult medical examination without abnormal findings: Secondary | ICD-10-CM | POA: Diagnosis not present

## 2019-05-28 MED ORDER — CYANOCOBALAMIN 1000 MCG/ML IJ SOLN
1000.0000 ug | Freq: Once | INTRAMUSCULAR | Status: AC
  Administered 2019-05-28: 08:00:00 1000 ug via INTRAMUSCULAR

## 2019-05-28 NOTE — Addendum Note (Signed)
Addended by: Wynonia Musty A on: 05/28/2019 07:48 AM   Modules accepted: Orders

## 2019-06-06 ENCOUNTER — Other Ambulatory Visit: Payer: Self-pay | Admitting: *Deleted

## 2019-06-06 MED ORDER — "SYRINGE 25G X 1"" 3 ML MISC": 0 refills | Status: DC

## 2019-06-26 MED FILL — MONTELUKAST SOD 10 MG TAB: 10 | 90 days supply | Qty: 90 | Fill #1

## 2019-06-26 MED FILL — buPROPion HCL ER (XL) 300 M: 300 | 90 days supply | Qty: 90 | Fill #1

## 2019-08-03 DIAGNOSIS — J45901 Unspecified asthma with (acute) exacerbation: Secondary | ICD-10-CM | POA: Diagnosis not present

## 2019-08-03 DIAGNOSIS — G4733 Obstructive sleep apnea (adult) (pediatric): Secondary | ICD-10-CM | POA: Diagnosis not present

## 2019-09-03 MED FILL — CYANOCOBALAMIN 1,000 MCG/ML: 1000 | 90 days supply | Qty: 3 | Fill #1

## 2019-09-12 DIAGNOSIS — H938X3 Other specified disorders of ear, bilateral: Secondary | ICD-10-CM | POA: Diagnosis not present

## 2019-09-12 DIAGNOSIS — H9202 Otalgia, left ear: Secondary | ICD-10-CM | POA: Diagnosis not present

## 2019-09-12 DIAGNOSIS — H9312 Tinnitus, left ear: Secondary | ICD-10-CM | POA: Diagnosis not present

## 2019-09-12 DIAGNOSIS — Z8669 Personal history of other diseases of the nervous system and sense organs: Secondary | ICD-10-CM | POA: Diagnosis not present

## 2019-09-17 ENCOUNTER — Other Ambulatory Visit: Payer: Self-pay | Admitting: Family

## 2019-09-17 MED FILL — MONTELUKAST SOD 10 MG TAB: 10 | 90 days supply | Qty: 90 | Fill #0

## 2019-09-17 MED FILL — BUPROPION HCL ER (XL) 300 M: 300 | 30 days supply | Qty: 30 | Fill #0

## 2019-10-05 MED FILL — CEQUA 0.09 % SOLN: 0.09 | 90 days supply | Qty: 180 | Fill #2

## 2019-10-17 ENCOUNTER — Ambulatory Visit (INDEPENDENT_AMBULATORY_CARE_PROVIDER_SITE_OTHER): Payer: 59 | Admitting: Podiatry

## 2019-10-17 ENCOUNTER — Other Ambulatory Visit: Payer: Self-pay

## 2019-10-17 DIAGNOSIS — L600 Ingrowing nail: Secondary | ICD-10-CM | POA: Diagnosis not present

## 2019-10-17 MED ORDER — GENTAMICIN SULFATE 0.1 % EX CREA: 1.0000 "application " | TOPICAL_CREAM | Freq: Two times a day (BID) | CUTANEOUS | 1 refills | Status: DC

## 2019-10-17 MED FILL — GENTAMICIN SULFATE 0.1 % CR: 0.1 | 7 days supply | Qty: 15 | Fill #0

## 2019-10-24 NOTE — Progress Notes (Signed)
   Subjective: Patient presents today for evaluation of throbbing pain to the medial border of the right great toe that began about two weeks ago. He reports associated redness, swelling and some drainage. Patient is concerned for possible ingrown nail. She denies any aggravating factors. She has been trimming the nail and soaking the toe in Epsom salt for treatment. Patient presents today for further treatment and evaluation.  Past Medical History:  Diagnosis Date  . Allergy   . Asthma   . B12 deficiency 05/14/2015  . Depression   . GERD (gastroesophageal reflux disease)   . History of chicken pox   . Migraines   . OSA (obstructive sleep apnea) 03/2016    Objective:  General: Well developed, nourished, in no acute distress, alert and oriented x3   Dermatology: Skin is warm, dry and supple bilateral. Medial border right hallux appears to be erythematous with evidence of an ingrowing nail. Pain on palpation noted to the border of the nail fold. The remaining nails appear unremarkable at this time. There are no open sores, lesions.  Vascular: Dorsalis Pedis artery and Posterior Tibial artery pedal pulses palpable. No lower extremity edema noted.   Neruologic: Grossly intact via light touch bilateral.  Musculoskeletal: Muscular strength within normal limits in all groups bilateral. Normal range of motion noted to all pedal and ankle joints.   Assesement: #1 Paronychia with ingrowing nail medial border right hallux  #2 Pain in toe #3 Incurvated nail  Plan of Care:  1. Patient evaluated.  2. Discussed treatment alternatives and plan of care. Explained nail avulsion procedure and post procedure course to patient. 3. Patient opted for permanent partial nail avulsion of the medial border of the right great toe.  4. Prior to procedure, local anesthesia infiltration utilized using 3 ml of a 50:50 mixture of 2% plain lidocaine and 0.5% plain marcaine in a normal hallux block fashion and a  betadine prep performed.  5. Partial permanent nail avulsion with chemical matrixectomy performed using [id number] applications of phenol followed by alcohol flush.  6. Light dressing applied. 7. Prescription for Gentamicin cream provided to patient to use daily with a bandage.  8. Return to clinic in 2 weeks.   Edrick Kins, DPM [facility]  Dr. Edrick Kins, [place]                                        [place], [place] [address]                Office [phone]  Fax [phone]

## 2019-10-25 MED FILL — BUPROPION HCL ER (XL) 300 M: 300 | 30 days supply | Qty: 30 | Fill #1

## 2019-10-31 ENCOUNTER — Other Ambulatory Visit: Payer: Self-pay

## 2019-10-31 ENCOUNTER — Ambulatory Visit: Payer: 59 | Admitting: Podiatry

## 2019-10-31 DIAGNOSIS — L03031 Cellulitis of right toe: Secondary | ICD-10-CM | POA: Diagnosis not present

## 2019-10-31 DIAGNOSIS — L600 Ingrowing nail: Secondary | ICD-10-CM | POA: Diagnosis not present

## 2019-10-31 MED ORDER — DOXYCYCLINE HYCLATE 100 MG PO TABS: 100.0000 mg | ORAL_TABLET | Freq: Two times a day (BID) | ORAL | 0 refills | Status: DC

## 2019-10-31 MED FILL — DOXYCYCLINE HYCLATE 100 MG: 100 | 10 days supply | Qty: 20 | Fill #0

## 2019-11-04 NOTE — Progress Notes (Signed)
   Subjective: 54 y.o. female presents today status post permanent nail avulsion procedure of the medial border of the right hallux that was performed on 10/17/2019.  Patient continues to have pain and tenderness to the medial border of the right hallux with some drainage and mild redness and swelling.  She has been applying the gentamicin cream and soaking as directed.  Past Medical History:  Diagnosis Date  . Allergy   . Asthma   . B12 deficiency 05/14/2015  . Depression   . GERD (gastroesophageal reflux disease)   . History of chicken pox   . Migraines   . OSA (obstructive sleep apnea) 03/2016    Objective: Skin is warm, dry and supple. Nail and respective nail fold appears to be very tender to palpation with some increased redness and swelling to the area.Marland Kitchen Open wound to the associated nail fold with a granular wound base and moderate amount of fibrotic tissue. Minimal drainage noted. Mild erythema around the periungual region likely due to phenol chemical matricectomy.  Assessment: #1 postop permanent partial nail avulsion right great toe medial border #2 open wound periungual nail fold of respective digit.   Plan of care: #1 patient was evaluated.   #2  Today the area was numbed so a deep debridement of the area could be performed to check for any remaining nail that may have been left within the nail fold.  3 cc of 2% lidocaine plain was infiltrated in the area after the toe was prepped aseptically.  Debridement of open wound was performed to the periungual border of the respective toe using a currette.  Additional borders of the nail was resected away and removed.  Dressings applied  #3 prescription for doxycycline 100 mg twice daily #20 #4 continue gentamicin cream daily with Epsom salt soaks #5 return to clinic in 2 weeks   Edrick Kins, DPM [facility]  Dr. Edrick Kins, [address]                                        [place], [place]  [address]                Office [phone]  Fax [phone]

## 2019-11-14 ENCOUNTER — Ambulatory Visit: Payer: 59 | Admitting: Podiatry

## 2019-11-19 MED FILL — BUPROPION HCL XL 300 MG TAB: 300 | 30 days supply | Qty: 30 | Fill #2

## 2019-11-20 ENCOUNTER — Ambulatory Visit (HOSPITAL_BASED_OUTPATIENT_CLINIC_OR_DEPARTMENT_OTHER)
Admission: RE | Admit: 2019-11-20 | Discharge: 2019-11-20 | Disposition: A | Discharge status: Home / Self Care | Payer: 59 | Source: Ambulatory Visit | Attending: Medical | Admitting: Medical

## 2019-11-20 ENCOUNTER — Ambulatory Visit: Payer: 59 | Admitting: Medical

## 2019-11-20 ENCOUNTER — Other Ambulatory Visit: Payer: Self-pay

## 2019-11-20 VITALS — BP 134/77 | HR 115 | Temp 95.6°F | Resp 12 | Ht 60.0 in | Wt 178.0 lb

## 2019-11-20 DIAGNOSIS — M79642 Pain in left hand: Secondary | ICD-10-CM | POA: Insufficient documentation

## 2019-11-20 MED ORDER — KETOROLAC TROMETHAMINE 60 MG/2ML IM SOLN
60.0000 mg | Freq: Once | INTRAMUSCULAR | Status: AC
  Administered 2019-11-20: 17:00:00 60 mg via INTRAMUSCULAR

## 2019-11-20 NOTE — Patient Instructions (Addendum)
For your left hand pain/pain over 2nd mcp joint, I ordered infammation labs studies today as well as xray.  We gave you toradol 60 mg im today.(no otc nsaids presently)  Will follow your lab results and xray. Then determined additoinal rx meds to be sent in.  Follow up in 7-10 days or as needed

## 2019-11-20 NOTE — Addendum Note (Signed)
Addended by: Kem Boroughs D on: 11/20/2019 04:57 PM   Modules accepted: Orders

## 2019-11-20 NOTE — Progress Notes (Signed)
Subjective:    Patient ID: Terri Johnson, female    DOB: 03-18-1966, 54 y.o.   MRN: [id number]  HPI  Pt states woke up with left hand 2nd digit mcp joint tenderness. Pain present on waking up. Hurt all day about 6-7/10. No trauma. Pt is rt handed. Tried tylenol and ibuprofen and help. Got minimal relief.   No hand xray on labs.  No hx of gout.   Does report hx of occasional rt knee pain and some back pain.      Review of Systems  Constitutional: Negative for chills, fatigue and fever.  Respiratory: Negative for chest tightness, shortness of breath and wheezing.   Cardiovascular: Negative for chest pain and palpitations.  Gastrointestinal: Negative for abdominal pain.  Musculoskeletal:       Left hand pain.  Neurological: Negative for dizziness and headaches.  Hematological: Negative for adenopathy. Does not bruise/bleed easily.  Psychiatric/Behavioral: Negative for behavioral problems and confusion.   Past Medical History:  Diagnosis Date  . Allergy   . Asthma   . B12 deficiency 05/14/2015  . Depression   . GERD (gastroesophageal reflux disease)   . History of chicken pox   . Migraines   . OSA (obstructive sleep apnea) 03/2016     Social History   Socioeconomic History  . Marital status: Married    Spouse name: Not on file  . Number of children: Not on file  . Years of education: Not on file  . Highest education level: Not on file  Occupational History  . Not on file  Tobacco Use  . Smoking status: Never Smoker  . Smokeless tobacco: Never Used  Substance and Sexual Activity  . Alcohol use: No    Alcohol/week: 0.0 standard drinks  . Drug use: No  . Sexual activity: Not on file  Other Topics Concern  . 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    Has associates degree   Enjoys painting, animals, antiquing, shopping, reading, movies   Social Determinants of Health   Financial  Resource Strain:   . Difficulty of Paying Living Expenses: Not on file  Food Insecurity:   . Worried About Charity fundraiser in the Last Year: Not on file  . Ran Out of Food in the Last Year: Not on file  Transportation Needs:   . Lack of Transportation (Medical): Not on file  . Lack of Transportation (Non-Medical): Not on file  Physical Activity:   . Days of Exercise per Week: Not on file  . Minutes of Exercise per Session: Not on file  Stress:   . Feeling of Stress : Not on file  Social Connections:   . Frequency of Communication with Friends and Family: Not on file  . Frequency of Social Gatherings with Friends and Family: Not on file  . Attends Religious Services: Not on file  . Active Member of Clubs or Organizations: Not on file  . Attends Archivist Meetings: Not on file  . Marital Status: Not on file  Intimate Partner Violence:   . Fear of Current or Ex-Partner: Not on file  . Emotionally Abused: Not on file  . Physically Abused: Not on file  . Sexually Abused: Not on file    Past Surgical History:  Procedure Laterality Date  . CESAREAN SECTION  1991  . CHOLECYSTECTOMY  2007  . eardrum repair -  left Left   . INNER EAR SURGERY     Pt states she had her "eardrum rebuilt". Has had 8 sets of tubes in ears through adolecense  . MYRINGOTOMY Bilateral    twice each.  . TONSILLECTOMY AND ADENOIDECTOMY  1976  . TUBAL LIGATION  2001    Family History  Problem Relation Age of Onset  . Hyperlipidemia Mother   . Heart disease Mother   . Hypertension Mother   . Depression Mother   . Parkinson's disease Father   . Depression Maternal Grandmother   . Heart disease Maternal Grandfather   . Hypertension Maternal Grandfather   . Alcohol abuse Maternal Grandfather   . AVM Maternal Aunt        congenital  . Aneurysm Maternal Aunt   . Depression Maternal Aunt   . Alcohol abuse Maternal Uncle     Allergies  Allergen Reactions  . Aspirin Other (See Comments)     Gi/ringing ears  . Tetracycline Rash    Rash/throat swells    Current Outpatient Medications on File Prior to Visit  Medication Sig Dispense Refill  . betamethasone valerate ointment (VALISONE) 0.1 % Apply small amount to ear as needed for itching/scaling.    Marland Kitchen buPROPion (WELLBUTRIN XL) 300 MG 24 hr tablet TAKE 1 TABLET (300 MG TOTAL) BY MOUTH DAILY. 90 tablet 1  . CEQUA 0.09 % SOLN 2 (two) times daily.    . Cyanocobalamin (B-12) 1000 MCG/ML KIT Inject 1 mL as directed every 30 (thirty) days. 12 kit 3  . Ergocalciferol (VITAMIN D2) 2000 UNITS TABS Take 1 tablet by mouth daily. Reported on 03/22/2016    . fluticasone (FLONASE) 50 MCG/ACT nasal spray Place 2 sprays into both nostrils daily. 16 g 6  . levocetirizine (XYZAL) 5 MG tablet Take 1 tablet (5 mg total) by mouth every evening. 30 tablet 5  . meloxicam (MOBIC) 7.5 MG tablet Take 1 tablet (7.5 mg total) by mouth daily. 14 tablet 0  . montelukast (SINGULAIR) 10 MG tablet TAKE 1 TABLET BY MOUTH AT BEDTIME 90 tablet 1  . pantoprazole (PROTONIX) 40 MG tablet Take 1 tablet (40 mg total) by mouth daily. 30 tablet 5  . Syringe/Needle, Disp, (SYRINGE 3CC/25GX1") 25G X 1" 3 ML MISC Use as directed with B12 12 each 0  . VENTOLIN HFA 108 (90 Base) MCG/ACT inhaler INHALE 2 PUFFS INTO THE LUNGS EVERY 6 HOURS AS NEEDED FOR WHEEZING OR SHORTNESS OF BREATH. 18 g 1   No current facility-administered medications on file prior to visit.    BP 134/77   Pulse (!) 115   Temp (!) 95.6 F (35.3 C) (Temporal)   Resp 12   Ht 5' (1.524 m)   Wt 178 lb (80.7 kg)   LMP 03/23/2014   SpO2 97%   BMI 34.76 kg/m       Objective:   Physical Exam   General- No acute distress. Pleasant patient. Neck- Full range of motion, no jvd Lungs- Clear, even and unlabored. Heart- regular rate and rhythm. Neurologic- CNII- XII grossly intact.  Left hand- has mild swelling to 2nd digit mcp joint. Tender to palpation. No redness or appearance of infection on  inspection     Assessment & Plan:  For your left hand pain/pain over 2nd mcp joint, I ordered infammation labs studies today as well as xray.  We gave you toradol 60 mg im today.  Will follow your lab results and xray. Then determined additoinal rx meds to be sent  in.  Follow up in 7-10 days or as needed  20 + minutes spent with pt. 50% of time spent counseling on plan going forward.   Mackie Pai, PA-C

## 2019-11-21 ENCOUNTER — Encounter: Payer: Self-pay | Admitting: Medical

## 2019-11-21 ENCOUNTER — Telehealth: Payer: Self-pay | Admitting: Medical

## 2019-11-21 LAB — ANA: Anti Nuclear Antibody (ANA): NEGATIVE

## 2019-11-21 LAB — RHEUMATOID FACTOR: Rheumatoid fact SerPl-aCnc: <14 IU/mL (ref <14)

## 2019-11-21 LAB — C-REACTIVE PROTEIN: CRP: <1 mg/dL (ref 0.5–20.0)

## 2019-11-21 LAB — URIC ACID: Uric Acid, Serum: 4.4 mg/dL (ref 2.4–7.0)

## 2019-11-21 LAB — SEDIMENTATION RATE: Sed Rate: 17 mm/hr (ref 0–30)

## 2019-11-21 MED ORDER — DICLOFENAC SODIUM 75 MG PO TBEC: 75.0000 mg | DELAYED_RELEASE_TABLET | Freq: Two times a day (BID) | ORAL | 0 refills | Status: DC

## 2019-11-21 MED FILL — DICLOFENAC SODIUM 75 MG TAB: 75 | 10 days supply | Qty: 20 | Fill #0

## 2019-11-21 NOTE — Telephone Encounter (Signed)
Rx diclofenac sent to pt pharmacy. 

## 2019-11-27 ENCOUNTER — Ambulatory Visit: Payer: 59 | Admitting: Family

## 2019-12-28 MED FILL — buPROPion HCL ER (XL) 300 M: 300 | 90 days supply | Qty: 90 | Fill #3

## 2020-01-04 DIAGNOSIS — J45901 Unspecified asthma with (acute) exacerbation: Secondary | ICD-10-CM | POA: Diagnosis not present

## 2020-01-04 DIAGNOSIS — G4733 Obstructive sleep apnea (adult) (pediatric): Secondary | ICD-10-CM | POA: Diagnosis not present

## 2020-01-09 MED FILL — BD 3 ML SYRINGE 25GX1: 25G X 1" | 12 days supply | Qty: 12 | Fill #0

## 2020-01-09 MED FILL — MONTELUKAST SOD 10 MG TAB: 10 | 90 days supply | Qty: 90 | Fill #1

## 2020-01-09 MED FILL — BD 3 ML SYRINGE 25GX1": 25G X 1" | 12 days supply | Qty: 12 | Fill #0

## 2020-01-09 MED FILL — CYANOCOBALAMIN 1,000 MCG/ML: 1000 | 84 days supply | Qty: 3 | Fill #2

## 2020-03-03 ENCOUNTER — Other Ambulatory Visit (HOSPITAL_COMMUNITY): Payer: Self-pay | Admitting: Orthopedic Surgery

## 2020-03-03 MED FILL — RESTASIS 0.05% EYE EMULSION: 0.05 | 90 days supply | Qty: 180 | Fill #0

## 2020-03-25 ENCOUNTER — Other Ambulatory Visit: Payer: Self-pay | Admitting: Family

## 2020-04-14 ENCOUNTER — Other Ambulatory Visit: Payer: Self-pay | Admitting: Family

## 2020-04-14 MED FILL — MONTELUKAST SOD 10 MG TAB: 10 | 30 days supply | Qty: 30 | Fill #0

## 2020-04-18 ENCOUNTER — Other Ambulatory Visit: Payer: Self-pay | Admitting: Family

## 2020-04-18 ENCOUNTER — Other Ambulatory Visit: Payer: Self-pay

## 2020-04-18 ENCOUNTER — Ambulatory Visit: Payer: 59 | Admitting: Family

## 2020-04-18 ENCOUNTER — Encounter: Payer: Self-pay | Admitting: Family

## 2020-04-18 VITALS — BP 130/69 | HR 109 | Temp 98.0°F | Resp 16 | Ht 60.0 in | Wt 177.0 lb

## 2020-04-18 DIAGNOSIS — G43809 Other migraine, not intractable, without status migrainosus: Secondary | ICD-10-CM

## 2020-04-18 DIAGNOSIS — E559 Vitamin D deficiency, unspecified: Secondary | ICD-10-CM | POA: Diagnosis not present

## 2020-04-18 DIAGNOSIS — F419 Anxiety disorder, unspecified: Secondary | ICD-10-CM

## 2020-04-18 DIAGNOSIS — N3281 Overactive bladder: Secondary | ICD-10-CM | POA: Diagnosis not present

## 2020-04-18 DIAGNOSIS — G4733 Obstructive sleep apnea (adult) (pediatric): Secondary | ICD-10-CM

## 2020-04-18 DIAGNOSIS — F329 Major depressive disorder, single episode, unspecified: Secondary | ICD-10-CM | POA: Diagnosis not present

## 2020-04-18 DIAGNOSIS — K219 Gastro-esophageal reflux disease without esophagitis: Secondary | ICD-10-CM

## 2020-04-18 DIAGNOSIS — J45909 Unspecified asthma, uncomplicated: Secondary | ICD-10-CM

## 2020-04-18 DIAGNOSIS — D519 Vitamin B12 deficiency anemia, unspecified: Secondary | ICD-10-CM

## 2020-04-18 DIAGNOSIS — F32A Depression, unspecified: Secondary | ICD-10-CM

## 2020-04-18 LAB — VITAMIN B12: Vitamin B-12: 1320 pg/mL — ABNORMAL HIGH (ref 200–1100)

## 2020-04-18 LAB — VITAMIN D 25 HYDROXY (VIT D DEFICIENCY, FRACTURES): Vit D, 25-Hydroxy: 50 ng/mL (ref 30–100)

## 2020-04-18 MED ORDER — MONTELUKAST SODIUM 10 MG PO TABS: 10.0000 mg | ORAL_TABLET | Freq: Every day | ORAL | 1 refills | Status: DC

## 2020-04-18 MED ORDER — BUPROPION HCL ER (XL) 300 MG PO TB24: 300.0000 mg | ORAL_TABLET | Freq: Every day | ORAL | 1 refills | Status: DC

## 2020-04-18 MED ORDER — OMEPRAZOLE 40 MG PO CPDR: 40.0000 mg | DELAYED_RELEASE_CAPSULE | Freq: Every day | ORAL | 3 refills | Status: DC

## 2020-04-18 MED ORDER — ALBUTEROL SULFATE HFA 108 (90 BASE) MCG/ACT IN AERS: INHALATION_SPRAY | RESPIRATORY_TRACT | 1 refills | Status: DC

## 2020-04-18 MED FILL — OMEPRAZOLE 40 MG CPDR: 40 | 30 days supply | Qty: 30 | Fill #0

## 2020-04-18 MED FILL — ALBUTEROL SULFATE HFA 108 (: 108 (90 BAS | 25 days supply | Qty: 18 | Fill #0

## 2020-04-18 NOTE — Patient Instructions (Addendum)
Please schedule follow up with Dr. Elsworth Soho for CPAP. Complete lab work prior to leaving.

## 2020-04-18 NOTE — Progress Notes (Signed)
Subjective:    Patient ID: Terri Johnson, female    DOB: 1965/11/12, 54 y.o.   MRN: [id number]  HPI   Patient is a 54 yr old female who presents today for follow up.  Anxiety/Depression- maintained on wellbutrin. Denies depression. Still anxious but able to control it better.    GERD- she stopped protonix 6 months ago. It did not help. Still having symptoms. Nexium used to help for a while, then she would get used to it.    b12 deficiency-she is self administering b12 monthly at home.   Asthma- maintained on singulair and prn albuterol. Reports that she has had "a couple of episodes." inhaler helps but she ran out.   OSA- she continues cpap.   Believe that she had covid last November.  Reports that she has not been able to taste/smell since that time.    Migraines- no recent migraines  OAB- unchanged.   Vit D deficiency- maintained on otc vit D 2000 iu once daily.    Review of Systems    see HPI  Past Medical History:  Diagnosis Date  . Allergy   . Asthma   . B12 deficiency 05/14/2015  . Depression   . GERD (gastroesophageal reflux disease)   . History of chicken pox   . Migraines   . OSA (obstructive sleep apnea) 03/2016     Social History   Socioeconomic History  . Marital status: Married    Spouse name: Not on file  . Number of children: Not on file  . Years of education: Not on file  . Highest education level: Not on file  Occupational History  . Not on file  Tobacco Use  . Smoking status: Never Smoker  . Smokeless tobacco: Never Used  Substance and Sexual Activity  . Alcohol use: No    Alcohol/week: 0.0 standard drinks  . Drug use: No  . Sexual activity: Not on file  Other Topics Concern  . 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    Has associates degree   Enjoys painting, animals, antiquing, shopping, reading, movies   Social Determinants of Health   Financial  Resource Strain:   . Difficulty of Paying Living Expenses:   Food Insecurity:   . Worried About Charity fundraiser in the Last Year:   . Arboriculturist in the Last Year:   Transportation Needs:   . Film/video editor (Medical):   Marland Kitchen Lack of Transportation (Non-Medical):   Physical Activity:   . Days of Exercise per Week:   . Minutes of Exercise per Session:   Stress:   . Feeling of Stress :   Social Connections:   . Frequency of Communication with Friends and Family:   . Frequency of Social Gatherings with Friends and Family:   . Attends Religious Services:   . Active Member of Clubs or Organizations:   . Attends Archivist Meetings:   Marland Kitchen Marital Status:   Intimate Partner Violence:   . Fear of Current or Ex-Partner:   . Emotionally Abused:   Marland Kitchen Physically Abused:   . Sexually Abused:     Past Surgical History:  Procedure Laterality Date  . CESAREAN SECTION  1991  . CHOLECYSTECTOMY  2007  . eardrum repair - left Left   . INNER EAR SURGERY     Pt states she had her "eardrum  rebuilt". Has had 8 sets of tubes in ears through adolecense  . MYRINGOTOMY Bilateral    twice each.  . TONSILLECTOMY AND ADENOIDECTOMY  1976  . TUBAL LIGATION  2001    Family History  Problem Relation Age of Onset  . Hyperlipidemia Mother   . Heart disease Mother   . Hypertension Mother   . Depression Mother   . Parkinson's disease Father   . Depression Maternal Grandmother   . Heart disease Maternal Grandfather   . Hypertension Maternal Grandfather   . Alcohol abuse Maternal Grandfather   . AVM Maternal Aunt        congenital  . Aneurysm Maternal Aunt   . Depression Maternal Aunt   . Alcohol abuse Maternal Uncle     Allergies  Allergen Reactions  . Aspirin Other (See Comments)    Gi/ringing ears  . Tetracycline Rash    Rash/throat swells    Current Outpatient Medications on File Prior to Visit  Medication Sig Dispense Refill  . betamethasone valerate ointment  (VALISONE) 0.1 % Apply small amount to ear as needed for itching/scaling.    Marland Kitchen buPROPion (WELLBUTRIN XL) 300 MG 24 hr tablet TAKE 1 TABLET BY MOUTH ONCE DAILY 30 tablet 0  . CEQUA 0.09 % SOLN 2 (two) times daily.    . Cyanocobalamin (B-12) 1000 MCG/ML KIT Inject 1 mL as directed every 30 (thirty) days. 12 kit 3  . Ergocalciferol (VITAMIN D2) 2000 UNITS TABS Take 1 tablet by mouth daily. Reported on 03/22/2016    . fluticasone (FLONASE) 50 MCG/ACT nasal spray Place 2 sprays into both nostrils daily. 16 g 6  . levocetirizine (XYZAL) 5 MG tablet Take 1 tablet (5 mg total) by mouth every evening. 30 tablet 5  . montelukast (SINGULAIR) 10 MG tablet TAKE 1 TABLET BY MOUTH EVERY NIGHT AT BEDTIME 30 tablet 0  . pantoprazole (PROTONIX) 40 MG tablet Take 1 tablet (40 mg total) by mouth daily. 30 tablet 5  . Syringe/Needle, Disp, (SYRINGE 3CC/25GX1") 25G X 1" 3 ML MISC Use as directed with B12 12 each 0  . VENTOLIN HFA 108 (90 Base) MCG/ACT inhaler INHALE 2 PUFFS INTO THE LUNGS EVERY 6 HOURS AS NEEDED FOR WHEEZING OR SHORTNESS OF BREATH. 18 g 1   No current facility-administered medications on file prior to visit.    BP 130/69 (BP Location: Right Arm, Patient Position: Sitting, Cuff Size: Large)   Pulse (!) 109   Temp 98 F (36.7 C) (Temporal)   Resp 16   Ht 5' (1.524 m)   Wt 177 lb (80.3 kg)   LMP 03/23/2014   SpO2 98%   BMI 34.57 kg/m    Objective:   Physical Exam Constitutional:      Appearance: She is well-developed.  Cardiovascular:     Rate and Rhythm: Normal rate and regular rhythm.     Heart sounds: Normal heart sounds. No murmur heard.   Pulmonary:     Effort: Pulmonary effort is normal. No respiratory distress.     Breath sounds: Normal breath sounds. No wheezing.  Psychiatric:        Behavior: Behavior normal.        Thought Content: Thought content normal.        Judgment: Judgment normal.           Assessment & Plan:  Anxiety/depression-overall fairly well  controlled.  We will continue Wellbutrin.  Migraines-patient reports migraines are well controlled.  Asthma-well controlled with Singulair.  Allergic rhinitis-currently stable  with use of Singulair, Flonase, and Zyrtec.  B12 deficiency-patient continues monthly B12 injections we will check follow-up B12 level.  Vitamin D deficiency-maintained on over-the-counter vitamin D supplement.  Will check follow-up vitamin D level.  GERD-uncontrolled.  Will give trial of omeprazole.  OSA-reports good compliance with CPAP.  I have advised her to schedule follow-up with her sleep specialist.  OAB- unchanged. Did not find benefit from medication. Monitor.  This visit occurred during the SARS-CoV-2 public health emergency.  Safety protocols were in place, including screening questions prior to the visit, additional usage of staff PPE, and extensive cleaning of exam room while observing appropriate contact time as indicated for disinfecting solutions.

## 2020-04-19 MED FILL — buPROPion HCL ER (XL) 300 M: 300 | 90 days supply | Qty: 90 | Fill #0

## 2020-04-25 DIAGNOSIS — G4733 Obstructive sleep apnea (adult) (pediatric): Secondary | ICD-10-CM | POA: Diagnosis not present

## 2020-04-25 DIAGNOSIS — J45901 Unspecified asthma with (acute) exacerbation: Secondary | ICD-10-CM | POA: Diagnosis not present

## 2020-05-07 ENCOUNTER — Other Ambulatory Visit: Payer: Self-pay | Admitting: Family

## 2020-05-07 MED FILL — CYANOCOBALAMIN 1,000 MCG/ML: 1000 | 84 days supply | Qty: 3 | Fill #3

## 2020-05-07 MED FILL — BD 3 ML SYRINGE 25GX1: 25G X 1" | 12 days supply | Qty: 12 | Fill #0

## 2020-05-12 MED FILL — MONTELUKAST SOD 10 MG TAB: 10 | 90 days supply | Qty: 90 | Fill #0

## 2020-05-20 MED FILL — OMEPRAZOLE 40 MG CPDR: 40 | 30 days supply | Qty: 30 | Fill #1

## 2020-06-20 MED FILL — OMEPRAZOLE 40 MG CPDR: 40 | 30 days supply | Qty: 30 | Fill #2

## 2020-07-24 MED FILL — OMEPRAZOLE 40 MG CPDR: 40 | 30 days supply | Qty: 30 | Fill #3

## 2020-07-24 MED FILL — buPROPion HCL ER (XL) 300 M: 300 | 90 days supply | Qty: 90 | Fill #1

## 2020-07-28 ENCOUNTER — Encounter: Payer: 59 | Admitting: Family

## 2020-07-31 MED FILL — MONTELUKAST SOD 10 MG TAB: 10 | 90 days supply | Qty: 90 | Fill #1

## 2020-08-22 ENCOUNTER — Telehealth: Payer: Self-pay | Admitting: Family

## 2020-08-22 ENCOUNTER — Other Ambulatory Visit: Payer: Self-pay

## 2020-08-22 ENCOUNTER — Ambulatory Visit (INDEPENDENT_AMBULATORY_CARE_PROVIDER_SITE_OTHER): Payer: 59 | Admitting: Family

## 2020-08-22 VITALS — BP 122/76 | HR 104 | Temp 98.6°F | Resp 16 | Ht 59.0 in | Wt 173.0 lb

## 2020-08-22 DIAGNOSIS — E785 Hyperlipidemia, unspecified: Secondary | ICD-10-CM | POA: Diagnosis not present

## 2020-08-22 DIAGNOSIS — E1169 Type 2 diabetes mellitus with other specified complication: Secondary | ICD-10-CM

## 2020-08-22 DIAGNOSIS — Z0000 Encounter for general adult medical examination without abnormal findings: Secondary | ICD-10-CM | POA: Diagnosis not present

## 2020-08-22 NOTE — Telephone Encounter (Signed)
Please call [facility] and request copy of pap smear.

## 2020-08-22 NOTE — Patient Instructions (Addendum)
Please complete lab work prior to leaving.  Please continue to work on healthy diet, exercise and weight loss.

## 2020-08-22 NOTE — Progress Notes (Signed)
Subjective:    Patient ID: Terri Johnson, female    DOB: [birth year], 54 y.o.   MRN: [id number]  HPI  Patient presents today for complete physical.  Immunizations: flu shot and pfizer vaccine up to date Diet: keto diet Wt Readings from Last 3 Encounters:  08/22/20 173 lb (78.5 kg)  04/18/20 177 lb (80.3 kg)  11/20/19 178 lb (80.7 kg)  Exercise: not exercising Colonoscopy: cologuard 2019 Dexa: 2018 Pap Smear: up to date- gyn Mammogram: 04/24/2019 Vision: due Dental: up to date    Review of Systems  Constitutional: Negative for unexpected weight change.  HENT: Negative for hearing loss and rhinorrhea.   Eyes: Negative for visual disturbance.  Respiratory: Negative for cough and shortness of breath.   Cardiovascular: Negative for chest pain.  Gastrointestinal: Negative for blood in stool, constipation and diarrhea.  Genitourinary: Negative for dysuria, frequency and hematuria.  Musculoskeletal: Negative for arthralgias and myalgias.  Skin: Negative for rash.  Neurological: Negative for headaches.  Hematological: Negative for adenopathy.  Psychiatric/Behavioral:       Reports mood is stable on medication   Past Medical History:  Diagnosis Date  . Allergy   . Asthma   . B12 deficiency 05/14/2015  . Depression   . GERD (gastroesophageal reflux disease)   . History of chicken pox   . Migraines   . OSA (obstructive sleep apnea) 03/2016     Social History   Socioeconomic History  . Marital status: Married    Spouse name: Not on file  . Number of children: Not on file  . Years of education: Not on file  . Highest education level: Not on file  Occupational History  . Not on file  Tobacco Use  . Smoking status: Never Smoker  . Smokeless tobacco: Never Used  Substance and Sexual Activity  . Alcohol use: No    Alcohol/week: 0.0 standard drinks  . Drug use: No  . Sexual activity: Not on file  Other Topics Concern  . 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    Has associates degree   Enjoys painting, animals, antiquing, shopping, reading, movies   Social Determinants of Health   Financial Resource Strain:   . Difficulty of Paying Living Expenses: Not on file  Food Insecurity:   . Worried About Charity fundraiser in the Last Year: Not on file  . Ran Out of Food in the Last Year: Not on file  Transportation Needs:   . Lack of Transportation (Medical): Not on file  . Lack of Transportation (Non-Medical): Not on file  Physical Activity:   . Days of Exercise per Week: Not on file  . Minutes of Exercise per Session: Not on file  Stress:   . Feeling of Stress : Not on file  Social Connections:   . Frequency of Communication with Friends and Family: Not on file  . Frequency of Social Gatherings with Friends and Family: Not on file  . Attends Religious Services: Not on file  . Active Member of Clubs or Organizations: Not on file  . Attends Archivist Meetings: Not on file  . Marital Status: Not on file  Intimate Partner Violence:   . Fear of Current or Ex-Partner: Not on file  . Emotionally Abused: Not on file  . Physically Abused: Not on file  . Sexually Abused: Not on file  Past Surgical History:  Procedure Laterality Date  . CESAREAN SECTION  1991  . CHOLECYSTECTOMY  2007  . eardrum repair - left Left   . INNER EAR SURGERY     Pt states she had her "eardrum rebuilt". Has had 8 sets of tubes in ears through adolecense  . MYRINGOTOMY Bilateral    twice each.  . TONSILLECTOMY AND ADENOIDECTOMY  1976  . TUBAL LIGATION  2001    Family History  Problem Relation Age of Onset  . Hyperlipidemia Mother   . Heart disease Mother   . Hypertension Mother   . Depression Mother   . Parkinson's disease Father   . Depression Maternal Grandmother   . Heart disease Maternal Grandfather   . Hypertension Maternal Grandfather   . Alcohol abuse Maternal  Grandfather   . AVM Maternal Aunt        congenital  . Aneurysm Maternal Aunt   . Depression Maternal Aunt   . Alcohol abuse Maternal Uncle     Allergies  Allergen Reactions  . Aspirin Other (See Comments)    Gi/ringing ears  . Tetracycline Rash    Rash/throat swells    Current Outpatient Medications on File Prior to Visit  Medication Sig Dispense Refill  . albuterol (VENTOLIN HFA) 108 (90 Base) MCG/ACT inhaler INHALE 2 PUFFS INTO THE LUNGS EVERY 6 HOURS AS NEEDED FOR WHEEZING OR SHORTNESS OF BREATH. 18 g 1  . betamethasone valerate ointment (VALISONE) 0.1 % Apply small amount to ear as needed for itching/scaling.    Marland Kitchen buPROPion (WELLBUTRIN XL) 300 MG 24 hr tablet Take 1 tablet (300 mg total) by mouth daily. 90 tablet 1  . CEQUA 0.09 % SOLN 2 (two) times daily.    . Cyanocobalamin (B-12) 1000 MCG/ML KIT Inject 1 mL as directed every 30 (thirty) days. 12 kit 3  . Ergocalciferol (VITAMIN D2) 2000 UNITS TABS Take 1 tablet by mouth daily. Reported on 03/22/2016    . fluticasone (FLONASE) 50 MCG/ACT nasal spray Place 2 sprays into both nostrils daily. 16 g 6  . levocetirizine (XYZAL) 5 MG tablet Take 1 tablet (5 mg total) by mouth every evening. 30 tablet 5  . montelukast (SINGULAIR) 10 MG tablet Take 1 tablet (10 mg total) by mouth at bedtime. 90 tablet 1  . omeprazole (PRILOSEC) 40 MG capsule Take 1 capsule (40 mg total) by mouth daily. 30 capsule 3  . SYRINGE-NEEDLE, DISP, 3 ML (B-D 3CC LUER-LOK SYR 25GX1") 25G X 1" 3 ML MISC USE AS DIRECTED WITH VIT B12 INJECTION 12 each 0   No current facility-administered medications on file prior to visit.    BP 122/76 (BP Location: Right Arm, Patient Position: Sitting, Cuff Size: Large)   Pulse (!) 104   Temp 98.6 F (37 C) (Oral)   Resp 16   Ht _0  (1.499 m)   Wt 173 lb (78.5 kg)   LMP 03/23/2014   SpO2 98%   BMI 34.94 kg/m        Objective:   Physical Exam  Physical Exam  Constitutional: She is oriented to person, place, and  time. She appears well-developed and well-nourished. No distress.  HENT:  Head: Normocephalic and atraumatic.  Right Ear: Tympanic membrane and ear canal normal.  Left Ear: Tympanic membrane and ear canal normal.  Mouth/Throat: not examined- pt wearing mask Eyes: Pupils are equal, round, and reactive to light. No scleral icterus.  Neck: Normal range of motion. No thyromegaly present.  Cardiovascular: Normal rate and  regular rhythm.   No murmur heard. Pulmonary/Chest: Effort normal and breath sounds normal. No respiratory distress. He has no wheezes. She has no rales. She exhibits no tenderness.  Abdominal: Soft. Bowel sounds are normal. She exhibits no distension and no mass. There is no tenderness. There is no rebound and no guarding.  Musculoskeletal: She exhibits no edema.  Lymphadenopathy:    She has no cervical adenopathy.  Neurological: She is alert and oriented to person, place, and time. She has normal patellar reflexes. She exhibits normal muscle tone. Coordination normal.  Skin: Skin is warm and dry.  Psychiatric: She has a normal mood and affect. Her behavior is normal. Judgment and thought content normal.  Breasts: Examined lying Right: Without masses, retractions, discharge or axillary adenopathy.  Left: Without masses, retractions, discharge or axillary adenopathy.  Breast/pelvic: deferred        Assessment & Plan:  Preventative care- pt declines shingrix #2 as she had a reaction to Shingrix #1.   Flu shot and covid shot up to date. Obtain routine lab work.  Refer for mammo. Request pap results from GYN.  cologuard up to date.   This visit occurred during the SARS-CoV-2 public health emergency.  Safety protocols were in place, including screening questions prior to the visit, additional usage of staff PPE, and extensive cleaning of exam room while observing appropriate contact time as indicated for disinfecting solutions.          Assessment & Plan:

## 2020-08-23 LAB — COMPREHENSIVE METABOLIC PANEL
AG Ratio: 1.8 (calc) (ref 1.0–2.5)
ALT: 22 U/L (ref 6–29)
AST: 20 U/L (ref 10–35)
Albumin: 4.4 g/dL (ref 3.6–5.1)
Alkaline phosphatase (APISO): 89 U/L (ref 37–153)
BUN: 20 mg/dL (ref 7–25)
CO2: 26 mmol/L (ref 20–32)
Calcium: 10 mg/dL (ref 8.6–10.4)
Chloride: 105 mmol/L (ref 98–110)
Creat: 0.83 mg/dL (ref 0.50–1.05)
Globulin: 2.4 g/dL (calc) (ref 1.9–3.7)
Glucose, Bld: 77 mg/dL (ref 65–99)
Potassium: 3.9 mmol/L (ref 3.5–5.3)
Sodium: 139 mmol/L (ref 135–146)
Total Bilirubin: 0.3 mg/dL (ref 0.2–1.2)
Total Protein: 6.8 g/dL (ref 6.1–8.1)

## 2020-08-23 LAB — LIPID PANEL
Cholesterol: 222 mg/dL — ABNORMAL HIGH (ref <200)
HDL: 49 mg/dL — ABNORMAL LOW (ref >=50)
LDL Cholesterol (Calc): 148 mg/dL (calc) — ABNORMAL HIGH
Non-HDL Cholesterol (Calc): 173 mg/dL (calc) — ABNORMAL HIGH (ref <130)
Total CHOL/HDL Ratio: 4.5 (calc) (ref <5.0)
Triglycerides: 126 mg/dL (ref <150)

## 2020-08-25 NOTE — Telephone Encounter (Signed)
[facility] release [facility] to [facility]

## 2020-09-05 ENCOUNTER — Other Ambulatory Visit: Payer: Self-pay | Admitting: Family

## 2020-09-05 MED FILL — OMEPRAZOLE 40 MG CPDR: 40 | 90 days supply | Qty: 90 | Fill #0

## 2020-10-15 ENCOUNTER — Other Ambulatory Visit: Payer: Self-pay | Admitting: Family

## 2020-10-15 MED FILL — buPROPion HCL ER (XL) 300 M: 300 | 90 days supply | Qty: 90 | Fill #0

## 2020-10-17 MED FILL — RESTASIS 0.05% EYE EMULSION: 0.05 | 90 days supply | Qty: 180 | Fill #1

## 2020-10-21 ENCOUNTER — Ambulatory Visit (HOSPITAL_BASED_OUTPATIENT_CLINIC_OR_DEPARTMENT_OTHER): Payer: 59

## 2020-11-03 ENCOUNTER — Other Ambulatory Visit: Payer: Self-pay | Admitting: Family

## 2020-11-03 MED FILL — MONTELUKAST SOD 10 MG TAB: 10 | 90 days supply | Qty: 90 | Fill #0

## 2020-11-26 ENCOUNTER — Other Ambulatory Visit (HOSPITAL_COMMUNITY): Payer: Self-pay | Admitting: Orthopedic Surgery

## 2020-11-26 DIAGNOSIS — H524 Presbyopia: Secondary | ICD-10-CM | POA: Diagnosis not present

## 2020-11-26 DIAGNOSIS — H52223 Regular astigmatism, bilateral: Secondary | ICD-10-CM | POA: Diagnosis not present

## 2020-11-26 DIAGNOSIS — H16223 Keratoconjunctivitis sicca, not specified as Sjogren's, bilateral: Secondary | ICD-10-CM | POA: Diagnosis not present

## 2021-01-08 ENCOUNTER — Other Ambulatory Visit (HOSPITAL_BASED_OUTPATIENT_CLINIC_OR_DEPARTMENT_OTHER): Payer: Self-pay

## 2021-01-23 ENCOUNTER — Other Ambulatory Visit (HOSPITAL_COMMUNITY): Payer: Self-pay

## 2021-01-23 MED FILL — Bupropion HCl Tab ER 24HR 300 MG: ORAL | 90 days supply | Qty: 90 | Fill #0 | Status: AC

## 2021-02-11 ENCOUNTER — Other Ambulatory Visit (HOSPITAL_COMMUNITY): Payer: Self-pay

## 2021-02-11 MED FILL — Montelukast Sodium Tab 10 MG (Base Equiv): ORAL | 90 days supply | Qty: 90 | Fill #0 | Status: AC

## 2021-02-24 ENCOUNTER — Other Ambulatory Visit (HOSPITAL_COMMUNITY): Payer: Self-pay

## 2021-02-24 ENCOUNTER — Other Ambulatory Visit: Payer: Self-pay

## 2021-02-24 ENCOUNTER — Ambulatory Visit: Payer: 59 | Admitting: Family

## 2021-02-24 VITALS — BP 134/74 | HR 107 | Temp 98.5°F | Resp 16 | Ht 59.0 in | Wt 161.2 lb

## 2021-02-24 DIAGNOSIS — F32A Depression, unspecified: Secondary | ICD-10-CM | POA: Diagnosis not present

## 2021-02-24 DIAGNOSIS — F419 Anxiety disorder, unspecified: Secondary | ICD-10-CM | POA: Diagnosis not present

## 2021-02-24 DIAGNOSIS — E538 Deficiency of other specified B group vitamins: Secondary | ICD-10-CM | POA: Diagnosis not present

## 2021-02-24 DIAGNOSIS — Z0000 Encounter for general adult medical examination without abnormal findings: Secondary | ICD-10-CM

## 2021-02-24 DIAGNOSIS — K219 Gastro-esophageal reflux disease without esophagitis: Secondary | ICD-10-CM

## 2021-02-24 DIAGNOSIS — J302 Other seasonal allergic rhinitis: Secondary | ICD-10-CM | POA: Insufficient documentation

## 2021-02-24 DIAGNOSIS — E559 Vitamin D deficiency, unspecified: Secondary | ICD-10-CM

## 2021-02-24 MED ORDER — "BD LUER-LOK SYRINGE 25G X 1"" 3 ML MISC"
0 refills | Status: AC
  Filled 2021-02-24: qty 12, 90d supply, fill #0

## 2021-02-24 MED ORDER — CYANOCOBALAMIN 1000 MCG/ML IJ SOLN
1000.0000 ug | INTRAMUSCULAR | 4 refills | Status: DC
  Filled 2021-02-24: qty 3, 90d supply, fill #0
  Filled 2021-07-19: qty 3, 90d supply, fill #1
  Filled 2022-01-14: qty 3, 90d supply, fill #2

## 2021-02-24 MED ORDER — BUPROPION HCL ER (XL) 300 MG PO TB24
ORAL_TABLET | Freq: Every day | ORAL | 1 refills | Status: DC
  Filled 2021-02-24 – 2021-04-23 (×2): qty 90, 90d supply, fill #0
  Filled 2021-08-03: qty 90, 90d supply, fill #1

## 2021-02-24 NOTE — Patient Instructions (Signed)
Please schedule pap smear with GYN.

## 2021-02-24 NOTE — Assessment & Plan Note (Signed)
Stable on Wellbutrin xl 348m once daily.

## 2021-02-24 NOTE — Assessment & Plan Note (Signed)
She continues monthly b12 injections at home.

## 2021-02-24 NOTE — Assessment & Plan Note (Signed)
Stable on zyrtec 10mg , flonase and singulair 10mg . Continue same.

## 2021-02-24 NOTE — Assessment & Plan Note (Signed)
Stable on omeprazole 40mg once daily. Continue same.  

## 2021-02-24 NOTE — Assessment & Plan Note (Signed)
She continues OTC supplement. Obtain follow up vit D level.

## 2021-02-24 NOTE — Progress Notes (Signed)
Subjective:   By signing my name below, I, Shehryar Baig, attest that this documentation has been prepared under the direction and in the presence of Debbrah Alar NP. 02/24/2021      Patient ID: Terri Johnson, female    DOB: August 22, 1966, 55 y.o.   MRN: [id number]  No chief complaint on file.   HPI Patient is in today for a office visit. She is doing well at this time. She is overdue on her Pap Smear appointment. She did not get her Covid 19 booster vaccinations.   Diet- She has recently started a new diet to manage her elevated sugar levels. Allergy- She takes 50 mcg Flonase, zyrtec daily PO, and 10 mg Singulair daily PO to manage her allergies and finds mild relief.  Vitamin B12- She continues to take vitamin B12. She is requesting a refill on vitamin B12. Depression- She is managing her symptoms well on 300 mg Wellbutrin daily PO. She is requesting a refill on 300 mg Wellbutrin daily PO. GERD- She is taking 40 mg omeprazole daily PO to manage her GERD well.  Vitamin D- She continues to take vitamin D2 daily PO. She request to have her vitamin levels measured. She has a history of low vitamin D.   Past Medical History:  Diagnosis Date  . Allergy   . Asthma   . B12 deficiency 05/14/2015  . Depression   . GERD (gastroesophageal reflux disease)   . History of chicken pox   . Migraines   . OSA (obstructive sleep apnea) 03/2016    Past Surgical History:  Procedure Laterality Date  . CESAREAN SECTION  1991  . CHOLECYSTECTOMY  2007  . eardrum repair - left Left   . INNER EAR SURGERY     Pt states she had her "eardrum rebuilt". Has had 8 sets of tubes in ears through adolecense  . MYRINGOTOMY Bilateral    twice each.  . TONSILLECTOMY AND ADENOIDECTOMY  1976  . TUBAL LIGATION  2001    Family History  Problem Relation Age of Onset  . Hyperlipidemia Mother   . Heart disease Mother   . Hypertension Mother   . Depression Mother   . Parkinson's disease Father   .  Depression Maternal Grandmother   . Heart disease Maternal Grandfather   . Hypertension Maternal Grandfather   . Alcohol abuse Maternal Grandfather   . AVM Maternal Aunt        congenital  . Aneurysm Maternal Aunt   . Depression Maternal Aunt   . Alcohol abuse Maternal Uncle     Social History   Socioeconomic History  . Marital status: Married    Spouse name: Not on file  . Number of children: Not on file  . Years of education: Not on file  . Highest education level: Not on file  Occupational History  . Not on file  Tobacco Use  . Smoking status: Never Smoker  . Smokeless tobacco: Never Used  Substance and Sexual Activity  . Alcohol use: No    Alcohol/week: 0.0 standard drinks  . Drug use: No  . Sexual activity: Not on file  Other Topics Concern  . 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    Has associates degree   Enjoys painting, animals, antiquing, shopping, reading, movies   Social Determinants of Health   Financial Resource Strain: Not on file  Food Insecurity:  Not on file  Transportation Needs: Not on file  Physical Activity: Not on file  Stress: Not on file  Social Connections: Not on file  Intimate Partner Violence: Not on file    Outpatient Medications Prior to Visit  Medication Sig Dispense Refill  . albuterol (VENTOLIN HFA) 108 (90 Base) MCG/ACT inhaler INHALE 2 PUFFS INTO THE LUNGS EVERY 6 HOURS AS NEEDED FOR WHEEZING OR SHORTNESS OF BREATH. 18 g 1  . betamethasone valerate ointment (VALISONE) 0.1 % Apply small amount to ear as needed for itching/scaling.    Marland Kitchen buPROPion (WELLBUTRIN XL) 300 MG 24 hr tablet TAKE 1 TABLET BY MOUTH DAILY. 90 tablet 1  . CEQUA 0.09 % SOLN 2 (two) times daily.    . Cyanocobalamin (B-12) 1000 MCG/ML KIT Inject 1 mL as directed every 30 (thirty) days. 12 kit 3  . cycloSPORINE (RESTASIS) 0.05 % ophthalmic emulsion INSTILL 1 DROP INTO BOTH EYES TWICE  A DAY 180 mL 4  . cycloSPORINE (RESTASIS) 0.05 % ophthalmic emulsion INSTILL 1 DROP INTO BOTH EYES TWICE DAILY 180 mL 99  . Ergocalciferol (VITAMIN D2) 2000 UNITS TABS Take 1 tablet by mouth daily. Reported on 03/22/2016    . fluticasone (FLONASE) 50 MCG/ACT nasal spray Place 2 sprays into both nostrils daily. 16 g 6  . levocetirizine (XYZAL) 5 MG tablet Take 1 tablet (5 mg total) by mouth every evening. 30 tablet 5  . montelukast (SINGULAIR) 10 MG tablet TAKE 1 TABLET BY MOUTH AT BEDTIME 90 tablet 3  . omeprazole (PRILOSEC) 40 MG capsule TAKE 1 CAPSULE BY MOUTH DAILY. 90 capsule 3  . SYRINGE-NEEDLE, DISP, 3 ML (B-D 3CC LUER-LOK SYR 25GX1") 25G X 1" 3 ML MISC USE AS DIRECTED WITH VIT B12 INJECTION 12 each 0   No facility-administered medications prior to visit.    Allergies  Allergen Reactions  . Aspirin Other (See Comments)    Gi/ringing ears  . Tetracycline Rash    Rash/throat swells    Review of Systems  Cardiovascular: Negative for leg swelling.       Objective:    Physical Exam Constitutional:      Appearance: Normal appearance. She is well-developed.  HENT:     Head: Normocephalic and atraumatic.     Right Ear: External ear normal.     Left Ear: External ear normal.  Eyes:     Extraocular Movements: Extraocular movements intact.     Pupils: Pupils are equal, round, and reactive to light.  Cardiovascular:     Rate and Rhythm: Normal rate and regular rhythm.     Pulses: Normal pulses.     Heart sounds: Normal heart sounds. No murmur heard. No friction rub. No gallop.   Pulmonary:     Effort: Pulmonary effort is normal. No respiratory distress.     Breath sounds: Normal breath sounds. No stridor. No wheezing, rhonchi or rales.  Skin:    General: Skin is warm and dry.  Neurological:     Mental Status: She is alert and oriented to person, place, and time.  Psychiatric:        Behavior: Behavior normal.        Thought Content: Thought content normal.        Judgment:  Judgment normal.     LMP 03/23/2014  Wt Readings from Last 3 Encounters:  08/22/20 173 lb (78.5 kg)  04/18/20 177 lb (80.3 kg)  11/20/19 178 lb (80.7 kg)    Diabetic Foot Exam - Simple   No  data filed    Lab Results  Component Value Date   WBC 6.3 05/25/2019   HGB 12.1 05/25/2019   HCT 36.3 05/25/2019   PLT 273.0 05/25/2019   GLUCOSE 77 08/22/2020   CHOL 222 (H) 08/22/2020   TRIG 126 08/22/2020   HDL 49 (L) 08/22/2020   LDLDIRECT 142.0 05/25/2019   LDLCALC 148 (H) 08/22/2020   ALT 22 08/22/2020   AST 20 08/22/2020   NA 139 08/22/2020   K 3.9 08/22/2020   CL 105 08/22/2020   CREATININE 0.83 08/22/2020   BUN 20 08/22/2020   CO2 26 08/22/2020   TSH 1.81 05/25/2019    Lab Results  Component Value Date   TSH 1.81 05/25/2019   Lab Results  Component Value Date   WBC 6.3 05/25/2019   HGB 12.1 05/25/2019   HCT 36.3 05/25/2019   MCV 93.8 05/25/2019   PLT 273.0 05/25/2019   Lab Results  Component Value Date   NA 139 08/22/2020   K 3.9 08/22/2020   CO2 26 08/22/2020   GLUCOSE 77 08/22/2020   BUN 20 08/22/2020   CREATININE 0.83 08/22/2020   BILITOT 0.3 08/22/2020   ALKPHOS 88 05/25/2019   AST 20 08/22/2020   ALT 22 08/22/2020   PROT 6.8 08/22/2020   ALBUMIN 4.6 05/25/2019   CALCIUM 10.0 08/22/2020   GFR 72.97 05/25/2019   Lab Results  Component Value Date   CHOL 222 (H) 08/22/2020   Lab Results  Component Value Date   HDL 49 (L) 08/22/2020   Lab Results  Component Value Date   LDLCALC 148 (H) 08/22/2020   Lab Results  Component Value Date   TRIG 126 08/22/2020   Lab Results  Component Value Date   CHOLHDL 4.5 08/22/2020   No results found for: HGBA1C     Assessment & Plan:   Problem List Items Addressed This Visit   None      No orders of the defined types were placed in this encounter.   I, Debbrah Alar NP, personally preformed the services described in this documentation.  All medical record entries made by the scribe  were at my direction and in my presence.  I have reviewed the chart and discharge instructions (if applicable) and agree that the record reflects my personal performance and is accurate and complete. 02/24/2021   I,Shehryar Baig,acting as a Education administrator for Nance Pear, NP.,have documented all relevant documentation on the behalf of Nance Pear, NP,as directed by  Nance Pear, NP while in the presence of Nance Pear, NP.   Shehryar [employer]

## 2021-02-25 LAB — VITAMIN B12: Vitamin B-12: >1550 pg/mL — ABNORMAL HIGH (ref 211–911)

## 2021-02-25 LAB — VITAMIN D 25 HYDROXY (VIT D DEFICIENCY, FRACTURES): VITD: 78.94 ng/mL (ref 30.00–100.00)

## 2021-03-02 ENCOUNTER — Other Ambulatory Visit (HOSPITAL_COMMUNITY): Payer: Self-pay

## 2021-03-31 ENCOUNTER — Other Ambulatory Visit (HOSPITAL_COMMUNITY): Payer: Self-pay

## 2021-03-31 MED ORDER — CYCLOSPORINE 0.05 % OP EMUL
1.0000 [drp] | Freq: Two times a day (BID) | OPHTHALMIC | 4 refills | Status: DC
  Filled 2021-03-31: qty 180, 90d supply, fill #0

## 2021-04-23 ENCOUNTER — Other Ambulatory Visit (HOSPITAL_COMMUNITY): Payer: Self-pay

## 2021-04-23 MED FILL — Omeprazole Cap Delayed Release 40 MG: ORAL | 90 days supply | Qty: 90 | Fill #0 | Status: AC

## 2021-05-13 ENCOUNTER — Other Ambulatory Visit (HOSPITAL_COMMUNITY): Payer: Self-pay

## 2021-05-13 MED FILL — Montelukast Sodium Tab 10 MG (Base Equiv): ORAL | 90 days supply | Qty: 90 | Fill #1 | Status: AC

## 2021-06-09 ENCOUNTER — Other Ambulatory Visit (HOSPITAL_COMMUNITY): Payer: Self-pay

## 2021-07-20 ENCOUNTER — Other Ambulatory Visit (HOSPITAL_COMMUNITY): Payer: Self-pay

## 2021-08-03 ENCOUNTER — Other Ambulatory Visit (HOSPITAL_COMMUNITY): Payer: Self-pay

## 2021-08-28 ENCOUNTER — Encounter: Payer: 59 | Admitting: Family

## 2021-09-18 ENCOUNTER — Encounter: Payer: 59 | Admitting: Family

## 2021-09-25 ENCOUNTER — Ambulatory Visit (INDEPENDENT_AMBULATORY_CARE_PROVIDER_SITE_OTHER): Payer: 59 | Admitting: Family

## 2021-09-25 ENCOUNTER — Encounter: Payer: Self-pay | Admitting: Family

## 2021-09-25 VITALS — BP 137/83 | HR 113 | Temp 98.7°F | Resp 16 | Ht 59.0 in | Wt 158.4 lb

## 2021-09-25 DIAGNOSIS — E559 Vitamin D deficiency, unspecified: Secondary | ICD-10-CM

## 2021-09-25 DIAGNOSIS — Z1211 Encounter for screening for malignant neoplasm of colon: Secondary | ICD-10-CM

## 2021-09-25 DIAGNOSIS — Z0000 Encounter for general adult medical examination without abnormal findings: Secondary | ICD-10-CM | POA: Diagnosis not present

## 2021-09-25 DIAGNOSIS — E538 Deficiency of other specified B group vitamins: Secondary | ICD-10-CM

## 2021-09-25 DIAGNOSIS — D649 Anemia, unspecified: Secondary | ICD-10-CM | POA: Diagnosis not present

## 2021-09-25 DIAGNOSIS — E785 Hyperlipidemia, unspecified: Secondary | ICD-10-CM | POA: Diagnosis not present

## 2021-09-25 NOTE — Progress Notes (Signed)
Subjective:     Patient ID: Terri Johnson, female    DOB: Mar 25, 1966, 55 y.o.   MRN: [id number]  Chief Complaint  Patient presents with   Annual Exam    Here for Annual Exam     Weight Loss    Complains of Weight     HPI  Patient presents today for complete physical.  Immunizations: Td due. She has had Pfizer x 2.    Diet:Trying to eat better.  Wt Readings from Last 3 Encounters:  09/25/21 158 lb 6.4 oz (71.8 kg)  02/24/21 161 lb 3.2 oz (73.1 kg)  08/22/20 173 lb (78.5 kg)  Exercise: walking Colonoscopy: due for cologuard Pap Smear: due- has appointment at [place].  Mammogram: due Vision: up to date Dental: up to date  She developed covid on 11/26.  Reports that she still has some chest and nasal congestion.   Health Maintenance Due  Topic Date Due   Pneumococcal Vaccine 12-23 Years old (1 - PCV) Never done   PAP SMEAR-Modifier  06/23/2018   Zoster Vaccines- Shingrix (2 of 2) 07/20/2019   DEXA SCAN  08/19/2019   COVID-19 Vaccine (3 - Booster for Pfizer series) 08/15/2020   TETANUS/TDAP  10/18/2020   Fecal DNA (Cologuard)  03/01/2021   MAMMOGRAM  04/23/2021   INFLUENZA VACCINE  05/18/2021    Past Medical History:  Diagnosis Date   Allergy    Asthma    B12 deficiency 05/14/2015   Depression    GERD (gastroesophageal reflux disease)    History of chicken pox    Migraines    OSA (obstructive sleep apnea) 03/2016    Past Surgical History:  Procedure Laterality Date   CESAREAN SECTION  1991   CHOLECYSTECTOMY  2007   eardrum repair - left Left    INNER EAR SURGERY     Pt states she had her "eardrum rebuilt". Has had 8 sets of tubes in ears through [employer]olecense   MYRINGOTOMY Bilateral    twice each.   TONSILLECTOMY AND ADENOIDECTOMY  1976   TUBAL LIGATION  2001    Family History  Problem Relation Age of Onset   Hyperlipidemia Mother    Heart disease Mother    Hypertension Mother    Depression Mother    Parkinson's disease Father     Depression Maternal Grandmother    Heart disease Maternal Grandfather    Hypertension Maternal Grandfather    Alcohol abuse Maternal Grandfather    AVM Maternal Aunt        congenital   Aneurysm Maternal Aunt    Depression Maternal Aunt    Alcohol abuse Maternal Uncle     Social History   Socioeconomic History   Marital status: Married    Spouse name: Not on file   Number of children: Not on file   Years of education: Not on file   Highest education level: Not on file  Occupational History   Not on file  Tobacco Use   Smoking status: Never   Smokeless tobacco: Never  Substance and Sexual Activity   Alcohol use: No    Alcohol/week: 0.0 standard drinks   Drug use: No   Sexual activity: Not on file  Other Topics Concern   Not on file  Social History Narrative   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    Has associates degree   Enjoys painting, animals, antiquing, shopping, reading, movies  Social Determinants of Health   Financial Resource Strain: Not on file  Food Insecurity: Not on file  Transportation Needs: Not on file  Physical Activity: Not on file  Stress: Not on file  Social Connections: Not on file  Intimate Partner Violence: Not on file    Outpatient Medications Prior to Visit  Medication Sig Dispense Refill   albuterol (VENTOLIN HFA) 108 (90 Base) MCG/ACT inhaler INHALE 2 PUFFS INTO THE LUNGS EVERY 6 HOURS AS NEEDED FOR WHEEZING OR SHORTNESS OF BREATH. 18 g 1   betamethasone valerate ointment (VALISONE) 0.1 % Apply small amount to ear as needed for itching/scaling.     buPROPion (WELLBUTRIN XL) 300 MG 24 hr tablet TAKE 1 TABLET BY MOUTH DAILY. 90 tablet 1   cetirizine (ZYRTEC) 10 MG tablet Take 10 mg by mouth daily.     Cholecalciferol (VITAMIN D3) 125 MCG (5000 UT) CHEW Chew by mouth.     cyanocobalamin (,VITAMIN B-12,) 1000 MCG/ML injection Inject 1 mL every 30 days. 12 mL 4   cycloSPORINE (RESTASIS) 0.05 %  ophthalmic emulsion INSTILL 1 DROP INTO BOTH EYES TWICE A DAY 180 mL 4   cycloSPORINE (RESTASIS) 0.05 % ophthalmic emulsion Place 1 drop into both eyes twice a day 180 each 4   fluticasone (FLONASE) 50 MCG/ACT nasal spray Place 2 sprays into both nostrils daily. 16 g 6   montelukast (SINGULAIR) 10 MG tablet TAKE 1 TABLET BY MOUTH AT BEDTIME 90 tablet 3   SYRINGE-NEEDLE, DISP, 3 ML (B-D 3CC LUER-LOK SYR 25GX1") 25G X 1" 3 ML MISC USE AS DIRECTED WITH VIT B12 INJECTION 12 each 0   omeprazole (PRILOSEC) 40 MG capsule TAKE 1 CAPSULE BY MOUTH DAILY. 90 capsule 3   No facility-administered medications prior to visit.    Allergies  Allergen Reactions   Aspirin Other (See Comments)    Gi/ringing ears   Tetracycline Rash    Rash/throat swells    Review of Systems  Constitutional:  Positive for weight loss.  HENT:  Positive for congestion.   Eyes:  Negative for blurred vision.  Respiratory:  Positive for cough.   Cardiovascular:  Negative for palpitations.  Gastrointestinal:  Negative for constipation, diarrhea and heartburn.  Genitourinary:  Negative for dysuria and frequency.  Musculoskeletal:  Negative for joint pain and myalgias.  Skin:  Negative for rash.  Neurological:  Negative for headaches.  Psychiatric/Behavioral:         Denies depression/anxiety       Objective:    Physical Exam  BP 137/83 (BP Location: Right Arm, Patient Position: Sitting, Cuff Size: Small)   Pulse (!) 113   Temp 98.7 F (37.1 C) (Oral)   Resp 16   Ht _0  (1.499 m)   Wt 158 lb 6.4 oz (71.8 kg)   LMP 03/23/2014   SpO2 97%   BMI 31.99 kg/m  Wt Readings from Last 3 Encounters:  09/25/21 158 lb 6.4 oz (71.8 kg)  02/24/21 161 lb 3.2 oz (73.1 kg)  08/22/20 173 lb (78.5 kg)   Physical Exam  Constitutional: She is oriented to person, place, and time. She appears well-developed and well-nourished. No distress.  HENT:  Head: Normocephalic and atraumatic.  Right Ear: Tympanic membrane and ear  canal normal.  Left Ear: Tympanic membrane and ear canal normal.  Mouth/Throat: not examined- pt wearing mask Eyes: Pupils are equal, round, and reactive to light. No scleral icterus.  Neck: Normal range of motion. No thyromegaly present.  Cardiovascular: Normal rate and  regular rhythm.   No murmur heard. Pulmonary/Chest: Effort normal and breath sounds normal. No respiratory distress. He has no wheezes. She has no rales. She exhibits no tenderness.  Abdominal: Soft. Bowel sounds are normal. She exhibits no distension and no mass. There is no tenderness. There is no rebound and no guarding.  Musculoskeletal: She exhibits no edema.  Lymphadenopathy:    She has no cervical adenopathy.  Neurological: She is alert and oriented to person, place, and time. She has normal patellar reflexes. She exhibits normal muscle tone. Coordination normal.  Skin: Skin is warm and dry.  Psychiatric: She has a normal mood and affect. Her behavior is normal. Judgment and thought content normal.  Breasts: Examined lying Right: Without masses, retractions, discharge or axillary adenopathy.  Left: Without masses, retractions, discharge or axillary adenopathy.            Assessment & Plan:       Assessment & Plan:   Problem List Items Addressed This Visit       Unprioritized   Vitamin D deficiency   Relevant Orders   Vitamin D (25 hydroxy)   Preventative health care - Primary    Encouraged pt to continue her work on healthy diet and regular exercise/weight loss. Mammogram and cologuard ordered. She will complete pap with GYN. Due for Td and Shingrix #1 as well as Covid booster when she is feeling better.  She will schedule Td and Shingrix shot with nurse here. She can get Covid booster from her local pharmacy.       Relevant Orders   MM 3D SCREEN BREAST BILATERAL   B12 deficiency   Relevant Orders   B12   Other Visit Diagnoses     Colon cancer screening       Relevant Orders   Cologuard    Hyperlipidemia, unspecified hyperlipidemia type       Relevant Orders   Lipid panel   Comp Met (CMET)   Anemia, unspecified type       Relevant Orders   CBC with Differential/Platelet       I am having Teona Mclaren maintain her betamethasone valerate ointment, fluticasone, albuterol, cycloSPORINE, montelukast, omeprazole, Vitamin D3, cetirizine, buPROPion, B-D 3CC LUER-LOK SYR 25GX1", cyanocobalamin, and cycloSPORINE.  No orders of the defined types were placed in this encounter.

## 2021-09-25 NOTE — Patient Instructions (Signed)
Please complete lab work prior to leaving. Continue your work on healthy diet and regular exercise.  Please complete and return the cologuard test after you receive it. Please schedule your mammogram downstairs in imaging.

## 2021-09-25 NOTE — Assessment & Plan Note (Signed)
Encouraged pt to continue her work on [employer] and regular exercise/weight loss. Mammogram and cologuard ordered. She will complete pap with GYN. Due for Td and Shingrix #1 as well as Covid booster when she is feeling better.  She will schedule Td and Shingrix shot with nurse here. She can get Covid booster from her local pharmacy.

## 2021-09-26 LAB — COMPREHENSIVE METABOLIC PANEL
AG Ratio: 1.6 (calc) (ref 1.0–2.5)
ALT: 20 U/L (ref 6–29)
AST: 17 U/L (ref 10–35)
Albumin: 4.4 g/dL (ref 3.6–5.1)
Alkaline phosphatase (APISO): 82 U/L (ref 37–153)
BUN: 16 mg/dL (ref 7–25)
CO2: 27 mmol/L (ref 20–32)
Calcium: 9.9 mg/dL (ref 8.6–10.4)
Chloride: 106 mmol/L (ref 98–110)
Creat: 0.81 mg/dL (ref 0.50–1.03)
Globulin: 2.7 g/dL (calc) (ref 1.9–3.7)
Glucose, Bld: 82 mg/dL (ref 65–99)
Potassium: 4.1 mmol/L (ref 3.5–5.3)
Sodium: 142 mmol/L (ref 135–146)
Total Bilirubin: 0.3 mg/dL (ref 0.2–1.2)
Total Protein: 7.1 g/dL (ref 6.1–8.1)

## 2021-09-26 LAB — CBC WITH DIFFERENTIAL/PLATELET
Absolute Monocytes: 657 cells/uL (ref 200–950)
Basophils Absolute: 54 cells/uL (ref 0–200)
Basophils Relative: 0.6 %
Eosinophils Absolute: 171 cells/uL (ref 15–500)
Eosinophils Relative: 1.9 %
HCT: 35.3 % (ref 35.0–45.0)
Hemoglobin: 11.8 g/dL (ref 11.7–15.5)
Lymphs Abs: 3312 cells/uL (ref 850–3900)
MCH: 30.9 pg (ref 27.0–33.0)
MCHC: 33.4 g/dL (ref 32.0–36.0)
MCV: 92.4 fL (ref 80.0–100.0)
MPV: 11 fL (ref 7.5–12.5)
Monocytes Relative: 7.3 %
Neutro Abs: 4806 cells/uL (ref 1500–7800)
Neutrophils Relative %: 53.4 %
Platelets: 353 10*3/uL (ref 140–400)
RBC: 3.82 10*6/uL (ref 3.80–5.10)
RDW: 13 % (ref 11.0–15.0)
Total Lymphocyte: 36.8 %
WBC: 9 10*3/uL (ref 3.8–10.8)

## 2021-09-26 LAB — LIPID PANEL
Cholesterol: 251 mg/dL — ABNORMAL HIGH (ref <200)
HDL: 52 mg/dL (ref >=50)
LDL Cholesterol (Calc): 166 mg/dL (calc) — ABNORMAL HIGH
Non-HDL Cholesterol (Calc): 199 mg/dL (calc) — ABNORMAL HIGH (ref <130)
Total CHOL/HDL Ratio: 4.8 (calc) (ref <5.0)
Triglycerides: 180 mg/dL — ABNORMAL HIGH (ref <150)

## 2021-09-26 LAB — VITAMIN B12: Vitamin B-12: >2000 pg/mL — ABNORMAL HIGH (ref 200–1100)

## 2021-09-26 LAB — VITAMIN D 25 HYDROXY (VIT D DEFICIENCY, FRACTURES): Vit D, 25-Hydroxy: 58 ng/mL (ref 30–100)

## 2021-10-26 ENCOUNTER — Other Ambulatory Visit: Payer: Self-pay | Admitting: Family

## 2021-10-26 ENCOUNTER — Inpatient Hospital Stay (HOSPITAL_BASED_OUTPATIENT_CLINIC_OR_DEPARTMENT_OTHER): Admission: RE | Admit: 2021-10-26 | Payer: 59 | Source: Ambulatory Visit

## 2021-10-27 ENCOUNTER — Other Ambulatory Visit (HOSPITAL_COMMUNITY): Payer: Self-pay

## 2021-10-27 MED ORDER — BUPROPION HCL ER (XL) 300 MG PO TB24
ORAL_TABLET | Freq: Every day | ORAL | 1 refills | Status: DC
  Filled 2021-10-27: qty 90, 90d supply, fill #0
  Filled 2022-01-23: qty 90, 90d supply, fill #1

## 2021-12-11 ENCOUNTER — Other Ambulatory Visit (HOSPITAL_COMMUNITY): Payer: Self-pay

## 2021-12-11 DIAGNOSIS — H52223 Regular astigmatism, bilateral: Secondary | ICD-10-CM | POA: Diagnosis not present

## 2021-12-11 DIAGNOSIS — H16223 Keratoconjunctivitis sicca, not specified as Sjogren's, bilateral: Secondary | ICD-10-CM | POA: Diagnosis not present

## 2021-12-11 DIAGNOSIS — H02889 Meibomian gland dysfunction of unspecified eye, unspecified eyelid: Secondary | ICD-10-CM | POA: Diagnosis not present

## 2021-12-11 DIAGNOSIS — H524 Presbyopia: Secondary | ICD-10-CM | POA: Diagnosis not present

## 2021-12-11 MED ORDER — CYCLOSPORINE 0.05 % OP EMUL
1.0000 [drp] | Freq: Two times a day (BID) | OPHTHALMIC | 3 refills | Status: AC
  Filled 2021-12-11: qty 180, 90d supply, fill #0
  Filled 2022-04-23: qty 180, 90d supply, fill #1
  Filled 2022-07-29: qty 180, 90d supply, fill #2
  Filled 2022-12-03: qty 180, 90d supply, fill #3

## 2021-12-18 ENCOUNTER — Encounter: Payer: Self-pay | Admitting: Family

## 2021-12-18 ENCOUNTER — Other Ambulatory Visit (HOSPITAL_COMMUNITY): Payer: Self-pay

## 2022-01-14 ENCOUNTER — Other Ambulatory Visit (HOSPITAL_COMMUNITY): Payer: Self-pay

## 2022-01-14 ENCOUNTER — Other Ambulatory Visit: Payer: Self-pay | Admitting: Family

## 2022-01-14 MED ORDER — OMEPRAZOLE 40 MG PO CPDR
DELAYED_RELEASE_CAPSULE | Freq: Every day | ORAL | 1 refills | Status: DC
  Filled 2022-01-14: qty 90, 90d supply, fill #0
  Filled 2022-04-22: qty 90, 90d supply, fill #1

## 2022-01-14 MED ORDER — MONTELUKAST SODIUM 10 MG PO TABS
ORAL_TABLET | Freq: Every day | ORAL | 1 refills | Status: DC
  Filled 2022-01-14: qty 90, 90d supply, fill #0

## 2022-01-18 ENCOUNTER — Telehealth: Payer: 59 | Admitting: Physician Assistant

## 2022-01-18 ENCOUNTER — Other Ambulatory Visit (HOSPITAL_COMMUNITY): Payer: Self-pay

## 2022-01-18 DIAGNOSIS — M5441 Lumbago with sciatica, right side: Secondary | ICD-10-CM

## 2022-01-18 MED ORDER — NAPROXEN 500 MG PO TABS
500.0000 mg | ORAL_TABLET | Freq: Two times a day (BID) | ORAL | 0 refills | Status: DC
  Filled 2022-01-18: qty 30, 15d supply, fill #0

## 2022-01-18 MED ORDER — CYCLOBENZAPRINE HCL 10 MG PO TABS
5.0000 mg | ORAL_TABLET | Freq: Three times a day (TID) | ORAL | 0 refills | Status: DC | PRN
  Filled 2022-01-18: qty 30, 10d supply, fill #0

## 2022-01-18 NOTE — Progress Notes (Signed)

## 2022-01-25 ENCOUNTER — Other Ambulatory Visit (HOSPITAL_COMMUNITY): Payer: Self-pay

## 2022-03-18 ENCOUNTER — Encounter: Payer: Self-pay | Admitting: *Deleted

## 2022-03-26 ENCOUNTER — Ambulatory Visit: Payer: 59 | Admitting: Family

## 2022-04-02 ENCOUNTER — Ambulatory Visit: Payer: 59 | Admitting: Family

## 2022-04-16 ENCOUNTER — Other Ambulatory Visit (HOSPITAL_COMMUNITY): Payer: Self-pay

## 2022-04-16 ENCOUNTER — Ambulatory Visit: Payer: 59 | Admitting: Family

## 2022-04-16 ENCOUNTER — Other Ambulatory Visit (HOSPITAL_BASED_OUTPATIENT_CLINIC_OR_DEPARTMENT_OTHER): Payer: Self-pay

## 2022-04-16 VITALS — BP 134/90 | HR 109 | Temp 98.3°F | Resp 16 | Wt 168.0 lb

## 2022-04-16 DIAGNOSIS — Z1231 Encounter for screening mammogram for malignant neoplasm of breast: Secondary | ICD-10-CM | POA: Diagnosis not present

## 2022-04-16 DIAGNOSIS — Z23 Encounter for immunization: Secondary | ICD-10-CM | POA: Diagnosis not present

## 2022-04-16 DIAGNOSIS — E559 Vitamin D deficiency, unspecified: Secondary | ICD-10-CM

## 2022-04-16 DIAGNOSIS — D519 Vitamin B12 deficiency anemia, unspecified: Secondary | ICD-10-CM

## 2022-04-16 DIAGNOSIS — E669 Obesity, unspecified: Secondary | ICD-10-CM

## 2022-04-16 DIAGNOSIS — F419 Anxiety disorder, unspecified: Secondary | ICD-10-CM | POA: Diagnosis not present

## 2022-04-16 DIAGNOSIS — J45909 Unspecified asthma, uncomplicated: Secondary | ICD-10-CM

## 2022-04-16 DIAGNOSIS — K219 Gastro-esophageal reflux disease without esophagitis: Secondary | ICD-10-CM | POA: Diagnosis not present

## 2022-04-16 DIAGNOSIS — J302 Other seasonal allergic rhinitis: Secondary | ICD-10-CM | POA: Diagnosis not present

## 2022-04-16 DIAGNOSIS — F32A Depression, unspecified: Secondary | ICD-10-CM

## 2022-04-16 MED ORDER — MONTELUKAST SODIUM 10 MG PO TABS
ORAL_TABLET | Freq: Every day | ORAL | 1 refills | Status: DC
  Filled 2022-04-16: qty 90, 90d supply, fill #0
  Filled 2022-07-29: qty 90, 90d supply, fill #1

## 2022-04-16 MED ORDER — ESCITALOPRAM OXALATE 10 MG PO TABS
ORAL_TABLET | ORAL | 0 refills | Status: DC
  Filled 2022-04-16: qty 30, 30d supply, fill #0

## 2022-04-16 MED ORDER — ALBUTEROL SULFATE HFA 108 (90 BASE) MCG/ACT IN AERS
INHALATION_SPRAY | RESPIRATORY_TRACT | 1 refills | Status: DC
  Filled 2022-04-16: qty 6.7, 25d supply, fill #0

## 2022-04-16 NOTE — Assessment & Plan Note (Signed)
Uncontrolled on zyrtec. Restart singulair.

## 2022-04-16 NOTE — Assessment & Plan Note (Signed)
Notes increased symptoms with poor air quality.  Albuterol helps. Continue same.

## 2022-04-16 NOTE — Progress Notes (Signed)
Subjective:   By signing my name below, I, Terri Johnson, attest that this documentation has been prepared under the direction and in the presence of [place], NP 04/16/2022.    Patient ID: Terri Johnson, female    DOB: 11/03/1965, 56 y.o.   MRN: [id number]  Chief Complaint  Patient presents with   Depression    Here for follow up   Anxiety    Here for follow up    HPI Patient is in today for an office visit.  Refills:  She is requesting a refill for her Albuterol inhaler.  Stress/Anxiety:  She has been under significant stress and anxiety lately. Her mother-in-law has also moved in with her. She is taking 300 mg Wellbutrin daily for management of her depression and anxiety. She finds herself tearful often and notes increased anxiety symptoms.  Insomnia:  Lately she is struggling with insomnia. Weight gain: She is concerned that she has gained some weight. Her mother-in-law has been preparing most of the meals, which aren't always as healthy.   Wt Readings from Last 3 Encounters:  04/16/22 168 lb (76.2 kg)  09/25/21 158 lb 6.4 oz (71.8 kg)  02/24/21 161 lb 3.2 oz (73.1 kg)   Acid reflux:  Typically her acid reflux is well managed with 40 mg Prilosec once daily. However, her acid reflux is worsening with her recent dietary changes as noted above. Environmental allergies: She states her allergies are "pretty bad." For treatment she is taking 10 mg Zyrtec once daily and 10 mg Singulair once daily. Shortness of breath: It is difficult for her to breathe, which she attributes to the air quality. She is needing to take albuterol a couple times a week. Immunizations: She is due for her Tetanus immunization. Previously she had an adverse reaction to the Shingles vaccination.  Health Maintenance Due  Topic Date Due   PAP SMEAR-Modifier  06/23/2018   DEXA SCAN  08/19/2019   COVID-19 Vaccine (3 - Pfizer series) 08/15/2020   Fecal DNA (Cologuard)  03/01/2021   MAMMOGRAM   04/23/2021    Past Medical History:  Diagnosis Date   Allergy    Asthma    B12 deficiency 05/14/2015   Depression    GERD (gastroesophageal reflux disease)    History of chicken pox    Migraines    OSA (obstructive sleep apnea) 03/2016    Past Surgical History:  Procedure Laterality Date   CESAREAN SECTION  1991   CHOLECYSTECTOMY  2007   eardrum repair - left Left    INNER EAR SURGERY     Pt states she had her "eardrum rebuilt". Has had 8 sets of tubes in ears through [employer]   MYRINGOTOMY Bilateral    twice each.   TONSILLECTOMY AND ADENOIDECTOMY  1976   TUBAL LIGATION  2001    Family History  Problem Relation Age of Onset   Hyperlipidemia Mother    Heart disease Mother    Hypertension Mother    Depression Mother    Parkinson's disease Father    Depression Maternal Grandmother    Heart disease Maternal Grandfather    Hypertension Maternal Grandfather    Alcohol abuse Maternal Grandfather    AVM Maternal Aunt        congenital   Aneurysm Maternal Aunt    Depression Maternal Aunt    Alcohol abuse Maternal Uncle     Social History   Socioeconomic History   Marital status: Married    Spouse name: Not on  file   Number of children: Not on file   Years of education: Not on file   Highest education level: Not on file  Occupational History   Not on file  Tobacco Use   Smoking status: Never   Smokeless tobacco: Never  Substance and Sexual Activity   Alcohol use: No    Alcohol/week: 0.0 standard drinks of alcohol   Drug use: No   Sexual activity: Not on file  Other Topics Concern   Not on file  Social History Narrative   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    Has associates degree   Enjoys painting, animals, antiquing, shopping, reading, movies   Social Determinants of Health   Financial Resource Strain: Not on file  Food Insecurity: Not on file  Transportation Needs: Not on file  Physical Activity: Not on  file  Stress: Not on file  Social Connections: Not on file  Intimate Partner Violence: Not on file    Outpatient Medications Prior to Visit  Medication Sig Dispense Refill   betamethasone valerate ointment (VALISONE) 0.1 % Apply small amount to ear as needed for itching/scaling.     buPROPion (WELLBUTRIN XL) 300 MG 24 hr tablet TAKE 1 TABLET BY MOUTH DAILY. 90 tablet 1   cetirizine (ZYRTEC) 10 MG tablet Take 10 mg by mouth daily.     Cholecalciferol (VITAMIN D3) 125 MCG (5000 UT) CHEW Chew by mouth.     cyanocobalamin (,VITAMIN B-12,) 1000 MCG/ML injection Inject 1 mL every 30 days. 12 mL 4   cyclobenzaprine (FLEXERIL) 10 MG tablet Take 1/2 -1 tablet (5-10 mg total) by mouth 3 (three) times daily as needed for muscle spasms. 30 tablet 0   cycloSPORINE (RESTASIS) 0.05 % ophthalmic emulsion Instill 1 drop into both eyes twice a day 180 each 3   fluticasone (FLONASE) 50 MCG/ACT nasal spray Place 2 sprays into both nostrils daily. 16 g 6   naproxen (NAPROSYN) 500 MG tablet Take 1 tablet (500 mg total) by mouth 2 (two) times daily with a meal. 30 tablet 0   omeprazole (PRILOSEC) 40 MG capsule TAKE 1 CAPSULE BY MOUTH DAILY. 90 capsule 1   SYRINGE-NEEDLE, DISP, 3 ML (B-D 3CC LUER-LOK SYR 25GX1") 25G X 1" 3 ML MISC USE AS DIRECTED WITH VIT B12 INJECTION 12 each 0   albuterol (VENTOLIN HFA) 108 (90 Base) MCG/ACT inhaler INHALE 2 PUFFS INTO THE LUNGS EVERY 6 HOURS AS NEEDED FOR WHEEZING OR SHORTNESS OF BREATH. 18 g 1   montelukast (SINGULAIR) 10 MG tablet TAKE 1 TABLET BY MOUTH AT BEDTIME 90 tablet 1   cycloSPORINE (RESTASIS) 0.05 % ophthalmic emulsion Place 1 drop into both eyes twice a day 180 each 4   No facility-administered medications prior to visit.    Allergies  Allergen Reactions   Aspirin Other (See Comments)    Gi/ringing ears   Tetracycline Rash    Rash/throat swells    Review of Systems  Psychiatric/Behavioral:  The patient has insomnia.        Objective:    Physical  Exam Constitutional:      Appearance: Normal appearance.  HENT:     Head: Normocephalic and atraumatic.     Right Ear: External ear normal.     Left Ear: External ear normal.  Eyes:     Extraocular Movements: Extraocular movements intact.     Pupils: Pupils are equal, round, and reactive to light.  Skin:  General: Skin is warm and dry.  Neurological:     Mental Status: She is alert and oriented to person, place, and time.  Psychiatric:        Mood and Affect: Mood normal. Affect is tearful.        Behavior: Behavior normal.        Judgment: Judgment normal.     BP 134/90 (BP Location: Right Arm, Patient Position: Sitting, Cuff Size: Large)   Pulse (!) 109   Temp 98.3 F (36.8 C) (Oral)   Resp 16   Wt 168 lb (76.2 kg)   LMP 03/23/2014   SpO2 98%   BMI 33.93 kg/m  Wt Readings from Last 3 Encounters:  04/16/22 168 lb (76.2 kg)  09/25/21 158 lb 6.4 oz (71.8 kg)  02/24/21 161 lb 3.2 oz (73.1 kg)       Assessment & Plan:   Problem List Items Addressed This Visit       Unprioritized   Vitamin D deficiency    She thinks her otc supplement in is 10000 iu. Check follow up vit D.       Relevant Orders   Vitamin D (25 hydroxy)   Vitamin B12 deficiency anemia    Continues b12 shots monthly.       Seasonal allergies    Uncontrolled on zyrtec. Restart singulair.       Relevant Orders   B12   GERD (gastroesophageal reflux disease)    Working on diet. Continue prilosec.        Asthma    Notes increased symptoms with poor air quality.  Albuterol helps. Continue same.       Relevant Medications   montelukast (SINGULAIR) 10 MG tablet   albuterol (VENTOLIN HFA) 108 (90 Base) MCG/ACT inhaler   Anxiety and depression    Uncontrolled given family stressors.  Will continue current dose of wellbutrin and add lexapro 10 mg.        Relevant Medications   escitalopram (LEXAPRO) 10 MG tablet   Other Visit Diagnoses     Obesity (BMI 30.0-34.9)    -  Primary    Relevant Orders   Amb Ref to Medical Weight [facility]   Encounter for screening mammogram for malignant neoplasm of breast       Relevant Orders   MM 3D SCREEN BREAST BILATERAL   Need for Td vaccine       Relevant Orders   Td : Tetanus/diphtheria >7yo Preservative  free (Completed)         Meds ordered this encounter  Medications   escitalopram (LEXAPRO) 10 MG tablet    Sig: Take 1/2 tablet by mouth once daily for 1 week, then increase to a full tablet once daily on week two.    Dispense:  30 tablet    Refill:  0    Order Specific Question:   Supervising Provider    Answer:   Penni Homans A [[id number]]   montelukast (SINGULAIR) 10 MG tablet    Sig: TAKE 1 TABLET BY MOUTH AT BEDTIME    Dispense:  90 tablet    Refill:  1    Order Specific Question:   Supervising Provider    Answer:   Penni Homans A [[id number]]   albuterol (VENTOLIN HFA) 108 (90 Base) MCG/ACT inhaler    Sig: INHALE 2 PUFFS INTO THE LUNGS EVERY 6 HOURS AS NEEDED FOR WHEEZING OR SHORTNESS OF BREATH.    Dispense:  6.7 g    Refill:  1  Order Specific Question:   Supervising Provider    Answer:   Mosie Lukes [[id number]]    I, Nance Pear, NP, personally preformed the services described in this documentation.  All medical record entries made by the scribe were at my direction and in my presence.  I have reviewed the chart and discharge instructions (if applicable) and agree that the record reflects my personal performance and is accurate and complete. 04/16/2022   I,Amber Collins,acting as a scribe for Nance Pear, NP.,have documented all relevant documentation on the behalf of Nance Pear, NP,as directed by  Nance Pear, NP while in the presence of Nance Pear, NP.   Nance Pear, NP

## 2022-04-16 NOTE — Assessment & Plan Note (Signed)
Continues b12 shots monthly.

## 2022-04-16 NOTE — Assessment & Plan Note (Signed)
Uncontrolled given family stressors.  Will continue current dose of wellbutrin and add lexapro 10 mg.

## 2022-04-16 NOTE — Patient Instructions (Addendum)
Please begin lexapro '10mg'$  1/2 tab once daily for 1 week, then increase to a full tab once daily on week two.  Please schedule follow up with GYN for pap, return Cologuard at your earliest convenience, schedule mammogram at the [facility] in [place].

## 2022-04-16 NOTE — Assessment & Plan Note (Signed)
Working on diet. Continue prilosec.

## 2022-04-16 NOTE — Assessment & Plan Note (Signed)
She thinks her otc supplement in is 10000 iu. Check follow up vit D.

## 2022-04-17 LAB — VITAMIN B12: Vitamin B-12: 1211 pg/mL — ABNORMAL HIGH (ref 200–1100)

## 2022-04-17 LAB — VITAMIN D 25 HYDROXY (VIT D DEFICIENCY, FRACTURES): Vit D, 25-Hydroxy: 39 ng/mL (ref 30–100)

## 2022-04-19 ENCOUNTER — Other Ambulatory Visit (HOSPITAL_COMMUNITY): Payer: Self-pay

## 2022-04-22 ENCOUNTER — Other Ambulatory Visit: Payer: Self-pay | Admitting: Family

## 2022-04-23 ENCOUNTER — Other Ambulatory Visit (HOSPITAL_COMMUNITY): Payer: Self-pay

## 2022-04-23 MED ORDER — BUPROPION HCL ER (XL) 300 MG PO TB24
ORAL_TABLET | Freq: Every day | ORAL | 1 refills | Status: DC
  Filled 2022-04-23: qty 90, 90d supply, fill #0
  Filled 2022-07-19: qty 90, 90d supply, fill #1

## 2022-04-23 MED ORDER — CYANOCOBALAMIN 1000 MCG/ML IJ SOLN
1000.0000 ug | INTRAMUSCULAR | 4 refills | Status: DC
  Filled 2022-04-23: qty 3, 90d supply, fill #0
  Filled 2022-07-19: qty 3, 90d supply, fill #1
  Filled 2022-10-20: qty 3, 90d supply, fill #2
  Filled 2023-02-02: qty 3, 90d supply, fill #3

## 2022-04-26 ENCOUNTER — Other Ambulatory Visit (HOSPITAL_COMMUNITY): Payer: Self-pay

## 2022-04-27 ENCOUNTER — Other Ambulatory Visit (HOSPITAL_COMMUNITY): Payer: Self-pay

## 2022-05-05 ENCOUNTER — Other Ambulatory Visit (HOSPITAL_COMMUNITY): Payer: Self-pay

## 2022-05-05 ENCOUNTER — Ambulatory Visit (INDEPENDENT_AMBULATORY_CARE_PROVIDER_SITE_OTHER): Payer: 59

## 2022-05-05 ENCOUNTER — Ambulatory Visit: Payer: 59 | Admitting: Podiatry

## 2022-05-05 DIAGNOSIS — G5761 Lesion of plantar nerve, right lower limb: Secondary | ICD-10-CM

## 2022-05-05 DIAGNOSIS — R52 Pain, unspecified: Secondary | ICD-10-CM

## 2022-05-05 MED ORDER — METHYLPREDNISOLONE 4 MG PO TBPK
ORAL_TABLET | ORAL | 0 refills | Status: DC
  Filled 2022-05-05: qty 21, 6d supply, fill #0

## 2022-05-05 MED ORDER — BETAMETHASONE SOD PHOS & ACET 6 (3-3) MG/ML IJ SUSP
3.0000 mg | Freq: Once | INTRAMUSCULAR | Status: AC
  Administered 2022-05-05: 3 mg via INTRA_ARTICULAR

## 2022-05-05 MED ORDER — MELOXICAM 15 MG PO TABS
15.0000 mg | ORAL_TABLET | Freq: Every day | ORAL | 1 refills | Status: DC
  Filled 2022-05-05: qty 30, 30d supply, fill #0
  Filled 2022-05-30: qty 30, 30d supply, fill #1

## 2022-05-05 NOTE — Progress Notes (Signed)
   Chief Complaint  Patient presents with   Toe Pain    right foot 3rd and 4th toe pain-burning/hurts to walk     HPI: 56 y.o. female presenting today for new complaint of pain to the right forefoot this been going on for about 1 week now.  Patient works long shifts at [employer] on concrete floors all day.  She has noticed over the past week increased pain and tenderness to the right forefoot.  Denies a history of injury.  She has been taking Aleve for the pain.  She also wears Brooks running shoes.  She presents for further treatment and evaluation  Past Medical History:  Diagnosis Date   Allergy    Asthma    B12 deficiency 05/14/2015   Depression    GERD (gastroesophageal reflux disease)    History of chicken pox    Migraines    OSA (obstructive sleep apnea) 03/2016    Past Surgical History:  Procedure Laterality Date   CESAREAN SECTION  1991   CHOLECYSTECTOMY  2007   eardrum repair - left Left    INNER EAR SURGERY     Pt states she had her "eardrum rebuilt". Has had 8 sets of tubes in ears through [employer]   MYRINGOTOMY Bilateral    twice each.   TONSILLECTOMY AND ADENOIDECTOMY  1976   TUBAL LIGATION  2001    Allergies  Allergen Reactions   Aspirin Other (See Comments)    Gi/ringing ears   Tetracycline Rash    Rash/throat swells      Physical Exam: General: The patient is alert and oriented x3 in no acute distress.  Dermatology: Skin is warm, dry and supple bilateral lower extremities. Negative for open lesions or macerations.  Vascular: Palpable pedal pulses bilaterally. No edema or erythema noted. Capillary refill within normal limits.  Neurological: Epicritic and protective threshold grossly intact bilaterally.   Musculoskeletal Exam: Sharp pain with palpation of the third interspace and lateral compression of the metatarsal heads consistent with neuroma.  Range of motion and palpation and range of motion to the lesser MTP joints  WNL  Radiographic Exam:  Normal osseous mineralization. Joint spaces preserved. No fracture/dislocation/boney destruction.    Assessment: 1.  Morton's neuroma third interspace RT foot   Plan of Care:  1. Patient was evaluated. X-Rays reviewed.  2.  Injection of 0.5 cc Celestone Soluspan injected along the third interspace right foot at the Morton's neuroma 3.  Prescription for Medrol Dosepak 4.  Prescription for meloxicam 15 mg daily 5.  Recommend wide fitting shoes that do not constrict the toebox area 6.  OTC power step insoles dispensed at checkout to support the arch of the foot and offload pressure from the forefoot 7.  Return to clinic 4 weeks  *[place] floors 10hr shifts   Edrick Kins, DPM [facility]  Dr. Edrick Kins, [address]                                        [place], [place] [address]                Office [phone]  Fax [phone]

## 2022-05-07 DIAGNOSIS — Z1211 Encounter for screening for malignant neoplasm of colon: Secondary | ICD-10-CM | POA: Diagnosis not present

## 2022-05-07 LAB — COLOGUARD: Cologuard: NEGATIVE

## 2022-05-14 ENCOUNTER — Ambulatory Visit: Payer: 59 | Admitting: Family

## 2022-05-14 ENCOUNTER — Other Ambulatory Visit (HOSPITAL_COMMUNITY): Payer: Self-pay

## 2022-05-14 DIAGNOSIS — F32A Depression, unspecified: Secondary | ICD-10-CM | POA: Diagnosis not present

## 2022-05-14 DIAGNOSIS — F419 Anxiety disorder, unspecified: Secondary | ICD-10-CM

## 2022-05-14 LAB — COLOGUARD: COLOGUARD: NEGATIVE

## 2022-05-14 MED ORDER — ESCITALOPRAM OXALATE 20 MG PO TABS
20.0000 mg | ORAL_TABLET | Freq: Every day | ORAL | 0 refills | Status: DC
  Filled 2022-05-14: qty 90, 90d supply, fill #0

## 2022-05-14 NOTE — Progress Notes (Signed)
Subjective:   By signing my name below, I, [name], attest that this documentation has been prepared under the direction and in the presence of [name], NP 05/14/2022   Patient ID: Terri Johnson, female    DOB: March 21, 1966, 56 y.o.   MRN: [id number]  Chief Complaint  Patient presents with   Depression    Follow up for depression and anxiety. "Still a little anxious, sleeping better"    HPI Patient is in today for an office visit  Anxiety - Since starting the 10 Mg of Lexapro she reports small improvements in her anxiety and improvements in sleep. Her tearfulness is persistent.  Meloxicam: She is currently taking 15 Mg of Meloxicam for the pain in her right foot. Weight: She states that she has not been contacted by [facility].  Wt Readings from Last 3 Encounters:  05/14/22 170 lb (77.1 kg)  04/16/22 168 lb (76.2 kg)  09/25/21 158 lb 6.4 oz (71.8 kg)    Health Maintenance Due  Topic Date Due   PAP SMEAR-Modifier  06/23/2018   DEXA SCAN  08/19/2019   COVID-19 Vaccine (3 - Pfizer series) 08/15/2020   Fecal DNA (Cologuard)  03/01/2021   MAMMOGRAM  04/23/2021    Past Medical History:  Diagnosis Date   Allergy    Asthma    B12 deficiency 05/14/2015   Depression    GERD (gastroesophageal reflux disease)    History of chicken pox    Migraines    OSA (obstructive sleep apnea) 03/2016    Past Surgical History:  Procedure Laterality Date   CESAREAN SECTION  1991   CHOLECYSTECTOMY  2007   eardrum repair - left Left    INNER EAR SURGERY     Pt states she had her "eardrum rebuilt". Has had 8 sets of tubes in ears through [employer]   MYRINGOTOMY Bilateral    twice each.   TONSILLECTOMY AND ADENOIDECTOMY  1976   TUBAL LIGATION  2001    Family History  Problem Relation Age of Onset   Hyperlipidemia Mother    Heart disease Mother    Hypertension Mother    Depression Mother    Parkinson's disease Father    Depression Maternal  Grandmother    Heart disease Maternal Grandfather    Hypertension Maternal Grandfather    Alcohol abuse Maternal Grandfather    AVM Maternal Aunt        congenital   Aneurysm Maternal Aunt    Depression Maternal Aunt    Alcohol abuse Maternal Uncle     Social History   Socioeconomic History   Marital status: Married    Spouse name: Not on file   Number of children: Not on file   Years of education: Not on file   Highest education level: Not on file  Occupational History   Not on file  Tobacco Use   Smoking status: Never   Smokeless tobacco: Never  Substance and Sexual Activity   Alcohol use: No    Alcohol/week: 0.0 standard drinks of alcohol   Drug use: No   Sexual activity: Not on file  Other Topics Concern   Not on file  Social History Narrative   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    Has associates degree   Enjoys painting, animals, antiquing, shopping, reading, movies   Social Determinants of Health   Financial Resource Strain: Not on file  Food Insecurity:  Not on file  Transportation Needs: Not on file  Physical Activity: Not on file  Stress: Not on file  Social Connections: Not on file  Intimate Partner Violence: Not on file    Outpatient Medications Prior to Visit  Medication Sig Dispense Refill   albuterol (VENTOLIN HFA) 108 (90 Base) MCG/ACT inhaler INHALE 2 PUFFS INTO THE LUNGS EVERY 6 HOURS AS NEEDED FOR WHEEZING OR SHORTNESS OF BREATH. 6.7 g 1   betamethasone valerate ointment (VALISONE) 0.1 % Apply small amount to ear as needed for itching/scaling.     buPROPion (WELLBUTRIN XL) 300 MG 24 hr tablet TAKE 1 TABLET BY MOUTH DAILY. 90 tablet 1   cetirizine (ZYRTEC) 10 MG tablet Take 10 mg by mouth daily.     Cholecalciferol (VITAMIN D3) 125 MCG (5000 UT) CHEW Chew by mouth.     cyanocobalamin (DODEX) 1000 MCG/ML injection Inject 1 mL every 30 days. 12 mL 4   cyclobenzaprine (FLEXERIL) 10 MG tablet Take 1/2 -1  tablet (5-10 mg total) by mouth 3 (three) times daily as needed for muscle spasms. 30 tablet 0   cycloSPORINE (RESTASIS) 0.05 % ophthalmic emulsion Instill 1 drop into both eyes twice a day 180 each 3   fluticasone (FLONASE) 50 MCG/ACT nasal spray Place 2 sprays into both nostrils daily. 16 g 6   meloxicam (MOBIC) 15 MG tablet Take 1 tablet (15 mg total) by mouth daily. 30 tablet 1   methylPREDNISolone (MEDROL DOSEPAK) 4 MG TBPK tablet 6 day dose pack - take as directed 21 tablet 0   montelukast (SINGULAIR) 10 MG tablet TAKE 1 TABLET BY MOUTH AT BEDTIME 90 tablet 1   omeprazole (PRILOSEC) 40 MG capsule TAKE 1 CAPSULE BY MOUTH DAILY. 90 capsule 1   SYRINGE-NEEDLE, DISP, 3 ML (B-D 3CC LUER-LOK SYR 25GX1") 25G X 1" 3 ML MISC USE AS DIRECTED WITH VIT B12 INJECTION 12 each 0   escitalopram (LEXAPRO) 10 MG tablet Take 1/2 tablet by mouth once daily for 1 week, then increase to a full tablet once daily on week two. 30 tablet 0   naproxen (NAPROSYN) 500 MG tablet Take 1 tablet (500 mg total) by mouth 2 (two) times daily with a meal. 30 tablet 0   No facility-administered medications prior to visit.    Allergies  Allergen Reactions   Aspirin Other (See Comments)    Gi/ringing ears   Tetracycline Rash    Rash/throat swells    ROS     Objective:    Physical Exam Constitutional:      General: She is not in acute distress.    Appearance: Normal appearance. She is not ill-appearing.  HENT:     Head: Normocephalic and atraumatic.     Right Ear: External ear normal.     Left Ear: External ear normal.  Eyes:     Extraocular Movements: Extraocular movements intact.     Pupils: Pupils are equal, round, and reactive to light.  Cardiovascular:     Rate and Rhythm: Normal rate and regular rhythm.     Heart sounds: Normal heart sounds. No murmur heard.    No gallop.  Pulmonary:     Effort: Pulmonary effort is normal. No respiratory distress.     Breath sounds: Normal breath sounds. No wheezing  or rales.  Skin:    General: Skin is warm and dry.  Neurological:     Mental Status: She is alert and oriented to person, place, and time.  Psychiatric:  Mood and Affect: Mood normal.        Behavior: Behavior normal.        Judgment: Judgment normal.     BP 118/67 (BP Location: Right Arm, Patient Position: Sitting, Cuff Size: Small)   Pulse (!) 103   Temp 98.5 F (36.9 C) (Oral)   Resp 16   Wt 170 lb (77.1 kg)   LMP 03/23/2014   SpO2 96%   BMI 34.34 kg/m  Wt Readings from Last 3 Encounters:  05/14/22 170 lb (77.1 kg)  04/16/22 168 lb (76.2 kg)  09/25/21 158 lb 6.4 oz (71.8 kg)       Assessment & Plan:   Problem List Items Addressed This Visit       Unprioritized   Anxiety and depression    Sleeping better but anxiety remains uncontrolled with the addition of lexapro '10mg'$ . Will increase lexapro to 20 mg. Encouraged her to establish with a counselor.       Relevant Medications   escitalopram (LEXAPRO) 20 MG tablet    Meds ordered this encounter  Medications   escitalopram (LEXAPRO) 20 MG tablet    Sig: Take 1 tablet (20 mg total) by mouth daily.    Dispense:  90 tablet    Refill:  0    Order Specific Question:   Supervising Provider    Answer:   Penni Homans A [[id number]]    I, Nance Pear, NP, personally preformed the services described in this documentation.  All medical record entries made by the scribe were at my direction and in my presence.  I have reviewed the chart and discharge instructions (if applicable) and agree that the record reflects my personal performance and is accurate and complete. 05/14/2022   I,Amber Collins,acting as a Education administrator for Nance Pear, NP.,have documented all relevant documentation on the behalf of Nance Pear, NP,as directed by  Nance Pear, NP while in the presence of Nance Pear, NP.    Nance Pear, NP

## 2022-05-14 NOTE — Assessment & Plan Note (Addendum)
Sleeping better but anxiety remains uncontrolled with the addition of lexapro '10mg'$ . Will increase lexapro to 20 mg. Encouraged her to establish with a counselor.

## 2022-05-14 NOTE — Patient Instructions (Signed)
Please increase lexapro to '20mg'$  once daily. Call [facility] to schedule an appointment:  [phone]

## 2022-05-25 ENCOUNTER — Ambulatory Visit
Admission: RE | Admit: 2022-05-25 | Discharge: 2022-05-25 | Disposition: A | Discharge status: Home / Self Care | Payer: 59 | Source: Ambulatory Visit | Attending: Family | Admitting: Family

## 2022-05-25 DIAGNOSIS — Z1231 Encounter for screening mammogram for malignant neoplasm of breast: Secondary | ICD-10-CM | POA: Diagnosis not present

## 2022-05-26 ENCOUNTER — Ambulatory Visit: Payer: 59 | Admitting: Podiatry

## 2022-05-26 DIAGNOSIS — G5761 Lesion of plantar nerve, right lower limb: Secondary | ICD-10-CM | POA: Diagnosis not present

## 2022-05-26 MED ORDER — BETAMETHASONE SOD PHOS & ACET 6 (3-3) MG/ML IJ SUSP
3.0000 mg | Freq: Once | INTRAMUSCULAR | Status: AC
  Administered 2022-05-26: 3 mg via INTRA_ARTICULAR

## 2022-05-26 NOTE — Progress Notes (Signed)
Chief Complaint  Patient presents with   Follow-up    3 week follow-up right foot 3rd and forth toe, patient states that it is better but tender on the bottom.     HPI: 56 y.o. female presenting today for follow-up evaluation of Morton's neuroma between the third and fourth digits of the right foot.  Patient works long shifts at [employer] on concrete floors all day.  She says the prednisone pack helped for a few days but as soon as she completed the prednisone pack the pain returned.  She tries to wear the OTC power step insoles but can only wear them for a few hours due to increased pain and she has to take them off.  No new complaints at this time  Past Medical History:  Diagnosis Date   Allergy    Asthma    B12 deficiency 05/14/2015   Depression    GERD (gastroesophageal reflux disease)    History of chicken pox    Migraines    OSA (obstructive sleep apnea) 03/2016    Past Surgical History:  Procedure Laterality Date   CESAREAN SECTION  1991   CHOLECYSTECTOMY  2007   eardrum repair - left Left    INNER EAR SURGERY     Pt states she had her "eardrum rebuilt". Has had 8 sets of tubes in ears through [employer]   MYRINGOTOMY Bilateral    twice each.   TONSILLECTOMY AND ADENOIDECTOMY  1976   TUBAL LIGATION  2001    Allergies  Allergen Reactions   Aspirin Other (See Comments)    Gi/ringing ears   Tetracycline Rash    Rash/throat swells      Physical Exam: General: The patient is alert and oriented x3 in no acute distress.  Dermatology: Skin is warm, dry and supple bilateral lower extremities. Negative for open lesions or macerations.  Vascular: Palpable pedal pulses bilaterally. No edema or erythema noted. Capillary refill within normal limits.  Neurological: Epicritic and protective threshold grossly intact bilaterally.   Musculoskeletal Exam: There continues to be Sharp pain with palpation of the third interspace and lateral compression of the  metatarsal heads consistent with neuroma.  Range of motion and palpation and range of motion to the lesser MTP joints WNL  Radiographic Exam 05/05/2022 RT foot:  Normal osseous mineralization. Joint spaces preserved. No fracture/dislocation/boney destruction.    Assessment: 1.  Morton's neuroma third interspace RT foot   Plan of Care:  1. Patient was evaluated. X-Rays reviewed.  2.  Injection of 0.5 cc Celestone Soluspan injected along the third interspace right foot at the Morton's neuroma 3.  Continue meloxicam 15 mg daily 4.  Patient continues to have pain and tenderness to the forefoot.  Cam boot dispensed to prevent range of motion and pressure to the forefoot.  Weightbearing as tolerated x 3-4 weeks 5.  After 3-4 weeks we will plan to return the patient back to good supportive sneakers with OTC power step insoles 6.  If there is no significant improvement in 4 weeks we may need to proceed with MRI RT forefoot  *[place] [employer] floors 10hr shifts   Edrick Kins, DPM [facility]  Dr. Edrick Kins, DPM    [address]  [place], [place] [address]                Office [phone]  Fax [phone]

## 2022-05-27 ENCOUNTER — Other Ambulatory Visit: Payer: Self-pay | Admitting: Family

## 2022-05-27 DIAGNOSIS — R928 Other abnormal and inconclusive findings on diagnostic imaging of breast: Secondary | ICD-10-CM

## 2022-05-31 ENCOUNTER — Other Ambulatory Visit (HOSPITAL_COMMUNITY): Payer: Self-pay

## 2022-06-01 ENCOUNTER — Other Ambulatory Visit (HOSPITAL_BASED_OUTPATIENT_CLINIC_OR_DEPARTMENT_OTHER): Payer: Self-pay

## 2022-06-01 ENCOUNTER — Ambulatory Visit: Payer: 59 | Admitting: Medical

## 2022-06-01 VITALS — BP 124/70 | HR 110 | Temp 98.5°F | Resp 18 | Ht 59.0 in | Wt 170.0 lb

## 2022-06-01 DIAGNOSIS — T7840XA Allergy, unspecified, initial encounter: Secondary | ICD-10-CM

## 2022-06-01 MED ORDER — HYDROXYZINE HCL 10 MG PO TABS
10.0000 mg | ORAL_TABLET | Freq: Three times a day (TID) | ORAL | 0 refills | Status: DC | PRN
  Filled 2022-06-01: qty 21, 7d supply, fill #0

## 2022-06-01 MED ORDER — PREDNISONE 10 MG PO TABS
ORAL_TABLET | ORAL | 0 refills | Status: DC
  Filled 2022-06-01: qty 21, 6d supply, fill #0

## 2022-06-01 NOTE — Patient Instructions (Addendum)
The exact etiology of your allergic reaction is  unknown but outdoor exposure while gardening suspicious. We gave day oral prednisone and hydroxyzine for itching. If worsening or expanding  please notify us. If your rash reoccurs intermittently and no cause is identified then could consider allergist referral.  Rx advisement.  Follow up in 7 days or as needed.

## 2022-06-01 NOTE — Progress Notes (Signed)
Subjective:    Patient ID: Terri Johnson, female    DOB: Nov 11, 1965, 56 y.o.   MRN: [id number]  HPI Pt in with some itching all over. She states also sneezing for 2 days. This occurred after she was gardening.   On review no other suspicious exposures.  Pt has tried over the counter hydrocortisone. Did help much.  Hard time sleeping due to itch.   Pt is on claritin and zyrtec year round for allergies.     Review of Systems  Constitutional:  Negative for chills, fatigue and fever.  Respiratory:  Negative for cough, chest tightness, shortness of breath and wheezing.   Cardiovascular:  Negative for chest pain and palpitations.  Gastrointestinal:  Negative for abdominal pain.  Genitourinary:  Negative for dyspareunia, enuresis and frequency.  Musculoskeletal:  Negative for back pain.  Skin:  Positive for rash.   Past Medical History:  Diagnosis Date   Allergy    Asthma    B12 deficiency 05/14/2015   Depression    GERD (gastroesophageal reflux disease)    History of chicken pox    Migraines    OSA (obstructive sleep apnea) 03/2016     Social History   Socioeconomic History   Marital status: Married    Spouse name: Not on file   Number of children: Not on file   Years of education: Not on file   Highest education level: Not on file  Occupational History   Not on file  Tobacco Use   Smoking status: Never   Smokeless tobacco: Never  Substance and Sexual Activity   Alcohol use: No    Alcohol/week: 0.0 standard drinks of alcohol   Drug use: No   Sexual activity: Not on file  Other Topics Concern   Not on file  Social History Narrative   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    Has associates degree   Enjoys painting, animals, antiquing, shopping, reading, movies   Social Determinants of Health   Financial Resource Strain: Not on file  Food Insecurity: Not on file  Transportation Needs: Not on file  Physical  Activity: Not on file  Stress: Not on file  Social Connections: Not on file  Intimate Partner Violence: Not on file    Past Surgical History:  Procedure Laterality Date   Ester  2007   eardrum repair - left Left    INNER EAR SURGERY     Pt states she had her "eardrum rebuilt". Has had 8 sets of tubes in ears through [employer]   MYRINGOTOMY Bilateral    twice each.   TONSILLECTOMY AND ADENOIDECTOMY  1976   TUBAL LIGATION  2001    Family History  Problem Relation Age of Onset   Hyperlipidemia Mother    Heart disease Mother    Hypertension Mother    Depression Mother    Parkinson's disease Father    Depression Maternal Grandmother    Heart disease Maternal Grandfather    Hypertension Maternal Grandfather    Alcohol abuse Maternal Grandfather    AVM Maternal Aunt        congenital   Aneurysm Maternal Aunt    Depression Maternal Aunt    Alcohol abuse Maternal Uncle     Allergies  Allergen Reactions   Aspirin Other (See Comments)    Gi/ringing ears   Tetracycline Rash    Rash/throat swells    Current  Outpatient Medications on File Prior to Visit  Medication Sig Dispense Refill   albuterol (VENTOLIN HFA) 108 (90 Base) MCG/ACT inhaler INHALE 2 PUFFS INTO THE LUNGS EVERY 6 HOURS AS NEEDED FOR WHEEZING OR SHORTNESS OF BREATH. 6.7 g 1   betamethasone valerate ointment (VALISONE) 0.1 % Apply small amount to ear as needed for itching/scaling.     buPROPion (WELLBUTRIN XL) 300 MG 24 hr tablet TAKE 1 TABLET BY MOUTH DAILY. 90 tablet 1   cetirizine (ZYRTEC) 10 MG tablet Take 10 mg by mouth daily.     Cholecalciferol (VITAMIN D3) 125 MCG (5000 UT) CHEW Chew by mouth.     cyanocobalamin (DODEX) 1000 MCG/ML injection Inject 1 mL every 30 days. 12 mL 4   cyclobenzaprine (FLEXERIL) 10 MG tablet Take 1/2 -1 tablet (5-10 mg total) by mouth 3 (three) times daily as needed for muscle spasms. 30 tablet 0   cycloSPORINE (RESTASIS) 0.05 % ophthalmic  emulsion Instill 1 drop into both eyes twice a day 180 each 3   escitalopram (LEXAPRO) 20 MG tablet Take 1 tablet (20 mg total) by mouth daily. 90 tablet 0   fluticasone (FLONASE) 50 MCG/ACT nasal spray Place 2 sprays into both nostrils daily. 16 g 6   meloxicam (MOBIC) 15 MG tablet Take 1 tablet (15 mg total) by mouth daily. 30 tablet 1   montelukast (SINGULAIR) 10 MG tablet TAKE 1 TABLET BY MOUTH AT BEDTIME 90 tablet 1   omeprazole (PRILOSEC) 40 MG capsule TAKE 1 CAPSULE BY MOUTH DAILY. 90 capsule 1   SYRINGE-NEEDLE, DISP, 3 ML (B-D 3CC LUER-LOK SYR 25GX1") 25G X 1" 3 ML MISC USE AS DIRECTED WITH VIT B12 INJECTION 12 each 0   No current facility-administered medications on file prior to visit.    BP 124/70   Pulse (!) 110   Temp 98.5 F (36.9 C)   Resp 18   Ht '4\' 11"'$  (1.499 m)   Wt 170 lb (77.1 kg)   LMP 03/23/2014   SpO2 95%   BMI 34.34 kg/m        Objective:   Physical Exam  General Mental Status- Alert. General Appearance- Not in acute distress.   Skin Bilateral pinkish rash to forearms and proximal biceps. Left lower calf rash as well.   Chest and Lung Exam Auscultation: Breath Sounds:-Normal.  Cardiovascular Auscultation:Rythm- Regular. Murmurs & Other Heart Sounds:Auscultation of the heart reveals- No Murmurs.  Abdomen Inspection:-Inspeection Normal. Palpation/Percussion:Note:No mass. Palpation and Percussion of the abdomen reveal- Non Tender, Non Distended + BS, no rebound or guarding.   Neurologic Cranial Nerve exam:- CN III-XII intact(No nystagmus), symmetric smile. Strength:- 5/5 equal and symmetric strength both upper and lower extremities.       Assessment & Plan:   Patient Instructions  The exact etiology of your allergic reaction is  unknown but outdoor exposure while gardening suspicious. We gave day oral prednisone and hydroxyzine for itching. If worsening or expanding  please notify us. If your rash reoccurs intermittently and no cause is  identified then could consider allergist referral.  Rx advisement.  Follow up in 7 days or as needed.     Mackie Pai, PA-C

## 2022-06-04 ENCOUNTER — Ambulatory Visit
Admission: RE | Admit: 2022-06-04 | Discharge: 2022-06-04 | Disposition: A | Discharge status: Home / Self Care | Payer: 59 | Source: Ambulatory Visit | Attending: Family | Admitting: Family

## 2022-06-04 DIAGNOSIS — R922 Inconclusive mammogram: Secondary | ICD-10-CM | POA: Diagnosis not present

## 2022-06-04 DIAGNOSIS — R928 Other abnormal and inconclusive findings on diagnostic imaging of breast: Secondary | ICD-10-CM

## 2022-06-04 DIAGNOSIS — N6002 Solitary cyst of left breast: Secondary | ICD-10-CM | POA: Diagnosis not present

## 2022-06-18 ENCOUNTER — Other Ambulatory Visit (HOSPITAL_COMMUNITY): Payer: Self-pay

## 2022-06-18 ENCOUNTER — Other Ambulatory Visit: Payer: Self-pay | Admitting: Podiatry

## 2022-06-18 MED ORDER — MELOXICAM 15 MG PO TABS
15.0000 mg | ORAL_TABLET | Freq: Every day | ORAL | 1 refills | Status: DC
  Filled 2022-06-18: qty 30, 30d supply, fill #0
  Filled 2022-07-29: qty 30, 30d supply, fill #1

## 2022-06-22 ENCOUNTER — Other Ambulatory Visit: Payer: Self-pay | Admitting: Medical

## 2022-06-22 ENCOUNTER — Other Ambulatory Visit (HOSPITAL_COMMUNITY): Payer: Self-pay

## 2022-06-22 MED ORDER — HYDROXYZINE HCL 10 MG PO TABS
10.0000 mg | ORAL_TABLET | Freq: Three times a day (TID) | ORAL | 0 refills | Status: AC | PRN
  Filled 2022-06-22: qty 21, 7d supply, fill #0

## 2022-06-23 ENCOUNTER — Ambulatory Visit: Payer: 59 | Admitting: Podiatry

## 2022-06-23 DIAGNOSIS — G5761 Lesion of plantar nerve, right lower limb: Secondary | ICD-10-CM

## 2022-06-23 MED ORDER — BETAMETHASONE SOD PHOS & ACET 6 (3-3) MG/ML IJ SUSP
3.0000 mg | Freq: Once | INTRAMUSCULAR | Status: AC
  Administered 2022-06-23: 3 mg via INTRA_ARTICULAR

## 2022-06-23 NOTE — Progress Notes (Signed)
   Chief Complaint  Patient presents with   Neuroma    Patient came in today for a follow-up for right foot morton's neuroma, pt is still having pain, rate of pain 7 out of 10, TX: Mobic, Cam Boot, Injection (did help some)     HPI: 56 y.o. female presenting today for follow-up evaluation of Morton's neuroma between the third and fourth digits of the right foot.  Patient works long shifts at [employer] on concrete floors all day.  Patient states that overall there is some improvement.  She has been wearing the cam boot over the last 3 weeks.  Past Medical History:  Diagnosis Date   Allergy    Asthma    B12 deficiency 05/14/2015   Depression    GERD (gastroesophageal reflux disease)    History of chicken pox    Migraines    OSA (obstructive sleep apnea) 03/2016    Past Surgical History:  Procedure Laterality Date   CESAREAN SECTION  1991   CHOLECYSTECTOMY  2007   eardrum repair - left Left    INNER EAR SURGERY     Pt states she had her "eardrum rebuilt". Has had 8 sets of tubes in ears through [employer]   MYRINGOTOMY Bilateral    twice each.   TONSILLECTOMY AND ADENOIDECTOMY  1976   TUBAL LIGATION  2001    Allergies  Allergen Reactions   Aspirin Other (See Comments)    Gi/ringing ears   Tetracycline Rash    Rash/throat swells      Physical Exam: General: The patient is alert and oriented x3 in no acute distress.  Dermatology: Skin is warm, dry and supple bilateral lower extremities. Negative for open lesions or macerations.  Vascular: Palpable pedal pulses bilaterally. No edema or erythema noted. Capillary refill within normal limits.  Neurological: Epicritic and protective threshold grossly intact bilaterally.   Musculoskeletal Exam: There continues to be mild pain with palpation of the third interspace and lateral compression of the metatarsal heads consistent with neuroma.  Range of motion and palpation and range of motion to the lesser MTP joints  WNL  Radiographic Exam 05/05/2022 RT foot:  Normal osseous mineralization. Joint spaces preserved. No fracture/dislocation/boney destruction.    Assessment: 1.  Morton's neuroma third interspace RT foot   Plan of Care:  1. Patient was evaluated.  Over all there has been some significant improvement.  She may now transition out of the cam boot 2.  Injection of 0.5 cc Celestone Soluspan injected along the third interspace right foot at the Morton's neuroma 3.  Continue meloxicam 15 mg daily 4.  Patient may discontinue the cam boot.  Offloading felt metatarsal pads were applied to the insoles of the right shoe to offload pressure from the Morton's neuroma 5.  Return to clinic 4 weeks  *[place] floors 10hr shifts   Edrick Kins, DPM [facility]  Dr. Edrick Kins, DPM    [address], [place] [address]                Office [phone]  Fax [phone]

## 2022-06-24 ENCOUNTER — Other Ambulatory Visit (HOSPITAL_COMMUNITY): Payer: Self-pay

## 2022-06-25 ENCOUNTER — Ambulatory Visit: Payer: 59 | Admitting: Family

## 2022-06-25 DIAGNOSIS — F419 Anxiety disorder, unspecified: Secondary | ICD-10-CM

## 2022-06-25 DIAGNOSIS — F32A Depression, unspecified: Secondary | ICD-10-CM

## 2022-06-25 NOTE — Assessment & Plan Note (Signed)
Uncontrolled despite increase in lexapro and '300mg'$  of Wellbutrin xl daily. Recommended that she schedule an appointment with psychiatry.  I gave her several numbers to call. In the meantime, she is instructed to go to the [facility] if symptoms worsen prior to her getting gin with psychiatry.

## 2022-06-25 NOTE — Progress Notes (Signed)
Subjective:   By signing my name below, I, [name], attest that this documentation has been prepared under the direction and in the presence of [name], NP 06/25/2022   Patient ID: Terri Johnson, female    DOB: April 24, 1966, 56 y.o.   MRN: [id number]  Chief Complaint  Patient presents with   Anxiety    "Patient reports no improvement"     HPI Patient is in today for an office visit  Anxiety: Her lexapro medication was increased from 10 Mg to 20 Mg. She reports that her mood has worsen. She is experiencing increased moodiness, irritability, and anxiety. Her sleep is about the same as last visit. She reports that her MIL is still at the house which she is trying to cope with. At work, she experiences irritability.  Morton's neuroma: She was reported to have morton's neuroma in her third and fourth digits of the right foot. She states that she is given injections to the area. She is regularly following up with her podiatrist.  Cyst in Breast: She reports that the cyst in her breast is benign.    Health Maintenance Due  Topic Date Due   PAP SMEAR-Modifier  06/23/2018   DEXA SCAN  08/19/2019   COVID-19 Vaccine (3 - Pfizer series) 08/15/2020   INFLUENZA VACCINE  05/18/2022    Past Medical History:  Diagnosis Date   Allergy    Asthma    B12 deficiency 05/14/2015   Depression    GERD (gastroesophageal reflux disease)    History of chicken pox    Migraines    OSA (obstructive sleep apnea) 03/2016    Past Surgical History:  Procedure Laterality Date   CESAREAN SECTION  1991   CHOLECYSTECTOMY  2007   eardrum repair - left Left    INNER EAR SURGERY     Pt states she had her "eardrum rebuilt". Has had 8 sets of tubes in ears through [employer]   MYRINGOTOMY Bilateral    twice each.   TONSILLECTOMY AND ADENOIDECTOMY  1976   TUBAL LIGATION  2001    Family History  Problem Relation Age of Onset   Hyperlipidemia Mother    Heart disease Mother    Hypertension  Mother    Depression Mother    Parkinson's disease Father    Depression Maternal Grandmother    Heart disease Maternal Grandfather    Hypertension Maternal Grandfather    Alcohol abuse Maternal Grandfather    AVM Maternal Aunt        congenital   Aneurysm Maternal Aunt    Depression Maternal Aunt    Alcohol abuse Maternal Uncle     Social History   Socioeconomic History   Marital status: Married    Spouse name: Not on file   Number of children: Not on file   Years of education: Not on file   Highest education level: Not on file  Occupational History   Not on file  Tobacco Use   Smoking status: Never   Smokeless tobacco: Never  Substance and Sexual Activity   Alcohol use: No    Alcohol/week: 0.0 standard drinks of alcohol   Drug use: No   Sexual activity: Not on file  Other Topics Concern   Not on file  Social History Narrative   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    Has associates degree   Enjoys painting, animals, antiquing, shopping, reading, movies  Social Determinants of Health   Financial Resource Strain: Not on file  Food Insecurity: Not on file  Transportation Needs: Not on file  Physical Activity: Not on file  Stress: Not on file  Social Connections: Not on file  Intimate Partner Violence: Not on file    Outpatient Medications Prior to Visit  Medication Sig Dispense Refill   albuterol (VENTOLIN HFA) 108 (90 Base) MCG/ACT inhaler INHALE 2 PUFFS INTO THE LUNGS EVERY 6 HOURS AS NEEDED FOR WHEEZING OR SHORTNESS OF BREATH. 6.7 g 1   betamethasone valerate ointment (VALISONE) 0.1 % Apply small amount to ear as needed for itching/scaling.     buPROPion (WELLBUTRIN XL) 300 MG 24 hr tablet TAKE 1 TABLET BY MOUTH DAILY. 90 tablet 1   cetirizine (ZYRTEC) 10 MG tablet Take 10 mg by mouth daily.     Cholecalciferol (VITAMIN D3) 125 MCG (5000 UT) CHEW Chew by mouth.     cyanocobalamin (DODEX) 1000 MCG/ML injection Inject  1 mL every 30 days. 12 mL 4   cyclobenzaprine (FLEXERIL) 10 MG tablet Take 1/2 -1 tablet (5-10 mg total) by mouth 3 (three) times daily as needed for muscle spasms. 30 tablet 0   cycloSPORINE (RESTASIS) 0.05 % ophthalmic emulsion Instill 1 drop into both eyes twice a day 180 each 3   escitalopram (LEXAPRO) 20 MG tablet Take 1 tablet (20 mg total) by mouth daily. 90 tablet 0   fluticasone (FLONASE) 50 MCG/ACT nasal spray Place 2 sprays into both nostrils daily. 16 g 6   hydrOXYzine (ATARAX) 10 MG tablet Take 1 tablet (10 mg total) by mouth 3 (three) times daily as needed for itching. 21 tablet 0   meloxicam (MOBIC) 15 MG tablet Take 1 tablet (15 mg total) by mouth daily. 30 tablet 1   montelukast (SINGULAIR) 10 MG tablet TAKE 1 TABLET BY MOUTH AT BEDTIME 90 tablet 1   omeprazole (PRILOSEC) 40 MG capsule TAKE 1 CAPSULE BY MOUTH DAILY. 90 capsule 1   SYRINGE-NEEDLE, DISP, 3 ML (B-D 3CC LUER-LOK SYR 25GX1") 25G X 1" 3 ML MISC USE AS DIRECTED WITH VIT B12 INJECTION 12 each 0   No facility-administered medications prior to visit.    Allergies  Allergen Reactions   Aspirin Other (See Comments)    Gi/ringing ears   Tetracycline Rash    Rash/throat swells    ROS     Objective:    Physical Exam Constitutional:      General: She is not in acute distress.    Appearance: Normal appearance. She is not ill-appearing.  HENT:     Head: Normocephalic and atraumatic.     Right Ear: External ear normal.     Left Ear: External ear normal.  Eyes:     Extraocular Movements: Extraocular movements intact.  Pulmonary:     Effort: Pulmonary effort is normal.  Skin:    General: Skin is warm and dry.  Neurological:     Mental Status: She is alert and oriented to person, place, and time.  Psychiatric:        Attention and Perception: Attention normal.        Mood and Affect: Mood normal. Affect is tearful.        Speech: Speech normal.        Behavior: Behavior normal.        Judgment: Judgment  normal.     BP 136/83 (BP Location: Right Arm, Patient Position: Sitting, Cuff Size: Small)   Pulse (!) 108  Temp 98.4 F (36.9 C) (Oral)   Resp 16   LMP 03/23/2014   SpO2 97%  Wt Readings from Last 3 Encounters:  06/01/22 170 lb (77.1 kg)  05/14/22 170 lb (77.1 kg)  04/16/22 168 lb (76.2 kg)       Assessment & Plan:   Problem List Items Addressed This Visit       Unprioritized   Anxiety and depression    Uncontrolled despite increase in lexapro and '300mg'$  of Wellbutrin xl daily. Recommended that she schedule an appointment with psychiatry.  I gave her several numbers to call. In the meantime, she is instructed to go to the [facility] if symptoms worsen prior to her getting gin with psychiatry.       No orders of the defined types were placed in this encounter.   I, Nance Pear, NP, personally preformed the services described in this documentation.  All medical record entries made by the scribe were at my direction and in my presence.  I have reviewed the chart and discharge instructions (if applicable) and agree that the record reflects my personal performance and is accurate and complete. 06/25/2022   I,Amber Collins,acting as a scribe for Nance Pear, NP.,have documented all relevant documentation on the behalf of Nance Pear, NP,as directed by  Nance Pear, NP while in the presence of Nance Pear, NP.    Nance Pear, NP

## 2022-06-25 NOTE — Patient Instructions (Signed)
Psychiatric Services:  Dr. Chucky May [facility]) - [phone] [place]) - [phone] [facility] [place]) [phone] [facility] [place]) [phone] [place]) [phone]   If your depression/anxiety worsens between now and when you get in with Psychiatry you can go to the [facility]:  [phone]

## 2022-07-10 ENCOUNTER — Telehealth: Payer: 59 | Admitting: Nurse Practitioner

## 2022-07-10 DIAGNOSIS — K591 Functional diarrhea: Secondary | ICD-10-CM

## 2022-07-10 NOTE — Progress Notes (Signed)
We are sorry that you are not feeling well.  Here is how we plan to help!  Based on what you have shared with me it looks like you have Acute Infectious Diarrhea.  Most cases of acute diarrhea are due to infections with virus and bacteria and are self-limited conditions lasting less than 14 days.  For your symptoms you may take Imodium 2 mg tablets that are over the counter at your local pharmacy. Take two tablet now and then one after each loose stool up to 6 a day.  Antibiotics are not needed for most people with diarrhea.  Continue imodium- if no better by Monday will need to talk to PCP about stool specimen.  HOME CARE We recommend changing your diet to help with your symptoms for the next few days. Drink plenty of fluids that contain water salt and sugar. Sports drinks such as Gatorade may help.  You may try broths, soups, bananas, applesauce, soft breads, mashed potatoes or crackers.  You are considered infectious for as long as the diarrhea continues. Hand washing or use of alcohol based hand sanitizers is recommend. It is best to stay out of work or school until your symptoms stop.   GET HELP RIGHT AWAY If you have dark yellow colored urine or do not pass urine frequently you should drink more fluids.   If your symptoms worsen  If you feel like you are going to pass out (faint) You have a new problem  MAKE SURE YOU  Understand these instructions. Will watch your condition. Will get help right away if you are not doing well or get worse.  Thank you for choosing an e-visit.  Your e-visit answers were reviewed by a board certified advanced clinical practitioner to complete your personal care plan. Depending upon the condition, your plan could have included both over the counter or prescription medications.  Please review your pharmacy choice. Make sure the pharmacy is open so you can pick up prescription now. If there is a problem, you may contact your provider through [employer] and have the prescription routed to another pharmacy.  Your safety is important to [place]. If you have drug allergies check your prescription carefully.   For the next 24 hours you can use MyChart to ask questions about today's visit, request a non-urgent call back, or ask for a work or school excuse. You will get an email in the next two days asking about your experience. I hope that your e-visit has been valuable and will speed your recovery.  Mary-Margaret Hassell Done, FNP   5-10 minutes spent reviewing and documenting in chart.

## 2022-07-19 ENCOUNTER — Other Ambulatory Visit: Payer: Self-pay | Admitting: Family

## 2022-07-20 ENCOUNTER — Other Ambulatory Visit (HOSPITAL_COMMUNITY): Payer: Self-pay

## 2022-07-20 MED ORDER — OMEPRAZOLE 40 MG PO CPDR
40.0000 mg | DELAYED_RELEASE_CAPSULE | Freq: Every day | ORAL | 1 refills | Status: DC
  Filled 2022-07-20: qty 90, 90d supply, fill #0
  Filled 2022-10-20: qty 90, 90d supply, fill #1

## 2022-07-21 ENCOUNTER — Ambulatory Visit: Payer: 59 | Admitting: Podiatry

## 2022-07-29 ENCOUNTER — Other Ambulatory Visit (HOSPITAL_COMMUNITY): Payer: Self-pay

## 2022-08-02 ENCOUNTER — Ambulatory Visit: Payer: 59 | Admitting: Podiatry

## 2022-08-16 ENCOUNTER — Other Ambulatory Visit (HOSPITAL_BASED_OUTPATIENT_CLINIC_OR_DEPARTMENT_OTHER): Payer: Self-pay

## 2022-08-16 ENCOUNTER — Ambulatory Visit: Payer: 59 | Admitting: Family Medicine

## 2022-08-16 ENCOUNTER — Encounter: Payer: Self-pay | Admitting: Family Medicine

## 2022-08-16 VITALS — BP 116/80 | HR 117 | Temp 98.2°F | Resp 12 | Ht 59.0 in | Wt 176.2 lb

## 2022-08-16 DIAGNOSIS — T50905A Adverse effect of unspecified drugs, medicaments and biological substances, initial encounter: Secondary | ICD-10-CM

## 2022-08-16 DIAGNOSIS — M25511 Pain in right shoulder: Secondary | ICD-10-CM | POA: Diagnosis not present

## 2022-08-16 MED ORDER — MELOXICAM 15 MG PO TABS
15.0000 mg | ORAL_TABLET | Freq: Every day | ORAL | 0 refills | Status: DC
  Filled 2022-08-16 – 2022-09-20 (×2): qty 30, 30d supply, fill #0

## 2022-08-16 MED ORDER — TRAMADOL HCL 50 MG PO TABS
50.0000 mg | ORAL_TABLET | Freq: Three times a day (TID) | ORAL | 0 refills | Status: AC | PRN
  Filled 2022-08-16: qty 15, 5d supply, fill #0

## 2022-08-16 MED ORDER — CYCLOBENZAPRINE HCL 10 MG PO TABS
10.0000 mg | ORAL_TABLET | Freq: Three times a day (TID) | ORAL | 0 refills | Status: DC | PRN
  Filled 2022-08-16: qty 30, 10d supply, fill #0

## 2022-08-16 NOTE — Assessment & Plan Note (Signed)
Warm moist compresses Muscle relaxer  mobic  Tramadol for severe pain

## 2022-08-16 NOTE — Patient Instructions (Addendum)
Warm compresses  Mobic prn Muscle relaxer prn Return if no improvement

## 2022-08-16 NOTE — Assessment & Plan Note (Signed)
?   If shot given too high and aggravated a nerve  rto if pain does not subside

## 2022-08-16 NOTE — Progress Notes (Signed)
Subjective:   By signing my name below, I, Terri Johnson, attest that this documentation has been prepared under the direction and in the presence of Terri Held DO 08/16/2022   Patient ID: Terri Johnson, female    DOB: 19-Jan-1966, 56 y.o.   MRN: [id number]  Chief Complaint  Patient presents with   Shoulder Pain    Right side, got flu shot wednesday    HPI Patient is in today for an office visit  She received an influenza vaccine on 08/11/2022 and complains of right arm pain that radiates to the right side of her neck. When she initially received the injection, she reported of usual muscle aches however, symptoms worsened from then. She states that when she injected the shot, it was more painful than usual. During the evening, she tosses between different sides. She has taken Ibuprofen, Aleve, and Tylenol but symptoms are still persistent.   Past Medical History:  Diagnosis Date   Allergy    Asthma    B12 deficiency 05/14/2015   Depression    GERD (gastroesophageal reflux disease)    History of chicken pox    Migraines    OSA (obstructive sleep apnea) 03/2016    Past Surgical History:  Procedure Laterality Date   CESAREAN SECTION  1991   CHOLECYSTECTOMY  2007   eardrum repair - left Left    INNER EAR SURGERY     Pt states she had her "eardrum rebuilt". Has had 8 sets of tubes in ears through [facility]   MYRINGOTOMY Bilateral    twice each.   TONSILLECTOMY AND ADENOIDECTOMY  1976   TUBAL LIGATION  2001    Family History  Problem Relation Age of Onset   Hyperlipidemia Mother    Heart disease Mother    Hypertension Mother    Depression Mother    Parkinson's disease Father    Depression Maternal Grandmother    Heart disease Maternal Grandfather    Hypertension Maternal Grandfather    Alcohol abuse Maternal Grandfather    AVM Maternal Aunt        congenital   Aneurysm Maternal Aunt    Depression Maternal Aunt    Alcohol abuse Maternal Uncle     Social  History   Socioeconomic History   Marital status: Married    Spouse name: Not on file   Number of children: Not on file   Years of education: Not on file   Highest education level: Not on file  Occupational History   Not on file  Tobacco Use   Smoking status: Never   Smokeless tobacco: Never  Substance and Sexual Activity   Alcohol use: No    Alcohol/week: 0.0 standard drinks of alcohol   Drug use: No   Sexual activity: Not on file  Other Topics Concern   Not on file  Social History Narrative   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    Has associates degree   Enjoys painting, animals, antiquing, shopping, reading, movies   Social Determinants of Health   Financial Resource Strain: Not on file  Food Insecurity: Not on file  Transportation Needs: Not on file  Physical Activity: Not on file  Stress: Not on file  Social Connections: Not on file  Intimate Partner Violence: Not on file    Outpatient Medications Prior to Visit  Medication Sig Dispense Refill   albuterol (VENTOLIN HFA) 108 (90 Base) MCG/ACT inhaler INHALE  2 PUFFS INTO THE LUNGS EVERY 6 HOURS AS NEEDED FOR WHEEZING OR SHORTNESS OF BREATH. 6.7 g 1   betamethasone valerate ointment (VALISONE) 0.1 % Apply small amount to ear as needed for itching/scaling.     buPROPion (WELLBUTRIN XL) 300 MG 24 hr tablet TAKE 1 TABLET BY MOUTH DAILY. 90 tablet 1   cetirizine (ZYRTEC) 10 MG tablet Take 10 mg by mouth daily.     Cholecalciferol (VITAMIN D3) 125 MCG (5000 UT) CHEW Chew by mouth.     cyanocobalamin (DODEX) 1000 MCG/ML injection Inject 1 mL every 30 days. 12 mL 4   cycloSPORINE (RESTASIS) 0.05 % ophthalmic emulsion Instill 1 drop into both eyes twice a day 180 each 3   escitalopram (LEXAPRO) 20 MG tablet Take 1 tablet (20 mg total) by mouth daily. 90 tablet 0   fluticasone (FLONASE) 50 MCG/ACT nasal spray Place 2 sprays into both nostrils daily. 16 g 6   hydrOXYzine (ATARAX) 10  MG tablet Take 1 tablet (10 mg total) by mouth 3 (three) times daily as needed for itching. 21 tablet 0   montelukast (SINGULAIR) 10 MG tablet TAKE 1 TABLET BY MOUTH AT BEDTIME 90 tablet 1   omeprazole (PRILOSEC) 40 MG capsule Take 1 capsule (40 mg total) by mouth daily. 90 capsule 1   SYRINGE-NEEDLE, DISP, 3 ML (B-D 3CC LUER-LOK SYR 25GX1") 25G X 1" 3 ML MISC USE AS DIRECTED WITH VIT B12 INJECTION 12 each 0   cyclobenzaprine (FLEXERIL) 10 MG tablet Take 1/2 -1 tablet (5-10 mg total) by mouth 3 (three) times daily as needed for muscle spasms. 30 tablet 0   meloxicam (MOBIC) 15 MG tablet Take 1 tablet (15 mg total) by mouth daily. 30 tablet 1   No facility-administered medications prior to visit.    Allergies  Allergen Reactions   Aspirin Other (See Comments)    Gi/ringing ears   Tetracycline Rash    Rash/throat swells    Review of Systems  Constitutional:  Negative for fever and malaise/fatigue.  HENT:  Negative for congestion.   Eyes:  Negative for blurred vision.  Respiratory:  Negative for shortness of breath.   Cardiovascular:  Negative for chest pain, palpitations and leg swelling.  Gastrointestinal:  Negative for abdominal pain, blood in stool and nausea.  Genitourinary:  Negative for dysuria and frequency.  Musculoskeletal:  Positive for joint pain (Right Shoulder) and neck pain (Right). Negative for falls.  Skin:  Negative for rash.  Neurological:  Negative for dizziness, loss of consciousness and headaches.  Endo/Heme/Allergies:  Negative for environmental allergies.  Psychiatric/Behavioral:  Negative for depression. The patient is not nervous/anxious.        Objective:    Physical Exam Vitals and nursing note reviewed.  Constitutional:      General: She is not in acute distress.    Appearance: Normal appearance. She is not ill-appearing.  HENT:     Head: Normocephalic and atraumatic.     Right Ear: External ear normal.     Left Ear: External ear normal.  Eyes:      Extraocular Movements: Extraocular movements intact.     Pupils: Pupils are equal, round, and reactive to light.  Cardiovascular:     Rate and Rhythm: Normal rate and regular rhythm.     Heart sounds: Normal heart sounds. No murmur heard.    No gallop.  Pulmonary:     Effort: Pulmonary effort is normal. No respiratory distress.     Breath sounds: Normal breath sounds.  No wheezing or rales.  Musculoskeletal:     Comments: Right Trapezius Spasms  Skin:    General: Skin is warm and dry.     Findings: Rash present. Rash is papular (Right Shoulder).  Neurological:     Mental Status: She is alert and oriented to person, place, and time.  Psychiatric:        Judgment: Judgment normal.     BP 116/80 (BP Location: Left Arm, Cuff Size: Large)   Pulse (!) 117   Temp 98.2 F (36.8 C) (Oral)   Resp 12   Ht '4\' 11"'$  (1.499 m)   Wt 176 lb 3.2 oz (79.9 kg)   LMP 03/23/2014   SpO2 95%   BMI 35.59 kg/m  Wt Readings from Last 3 Encounters:  08/16/22 176 lb 3.2 oz (79.9 kg)  06/01/22 170 lb (77.1 kg)  05/14/22 170 lb (77.1 kg)    Diabetic Foot Exam - Simple   No data filed    Lab Results  Component Value Date   WBC 9.0 09/25/2021   HGB 11.8 09/25/2021   HCT 35.3 09/25/2021   PLT 353 09/25/2021   GLUCOSE 82 09/25/2021   CHOL 251 (H) 09/25/2021   TRIG 180 (H) 09/25/2021   HDL 52 09/25/2021   LDLDIRECT 142.0 05/25/2019   LDLCALC 166 (H) 09/25/2021   ALT 20 09/25/2021   AST 17 09/25/2021   NA 142 09/25/2021   K 4.1 09/25/2021   CL 106 09/25/2021   CREATININE 0.81 09/25/2021   BUN 16 09/25/2021   CO2 27 09/25/2021   TSH 1.81 05/25/2019    Lab Results  Component Value Date   TSH 1.81 05/25/2019   Lab Results  Component Value Date   WBC 9.0 09/25/2021   HGB 11.8 09/25/2021   HCT 35.3 09/25/2021   MCV 92.4 09/25/2021   PLT 353 09/25/2021   Lab Results  Component Value Date   NA 142 09/25/2021   K 4.1 09/25/2021   CO2 27 09/25/2021   GLUCOSE 82 09/25/2021    BUN 16 09/25/2021   CREATININE 0.81 09/25/2021   BILITOT 0.3 09/25/2021   ALKPHOS 88 05/25/2019   AST 17 09/25/2021   ALT 20 09/25/2021   PROT 7.1 09/25/2021   ALBUMIN 4.6 05/25/2019   CALCIUM 9.9 09/25/2021   GFR 72.97 05/25/2019   Lab Results  Component Value Date   CHOL 251 (H) 09/25/2021   Lab Results  Component Value Date   HDL 52 09/25/2021   Lab Results  Component Value Date   LDLCALC 166 (H) 09/25/2021   Lab Results  Component Value Date   TRIG 180 (H) 09/25/2021   Lab Results  Component Value Date   CHOLHDL 4.8 09/25/2021   No results found for: "HGBA1C"     Assessment & Plan:   Problem List Items Addressed This Visit       Unprioritized   Reaction to shot    ? If shot given too high and aggravated a nerve  rto if pain does not subside       Relevant Medications   cyclobenzaprine (FLEXERIL) 10 MG tablet   meloxicam (MOBIC) 15 MG tablet   Other Relevant Orders   CBC with Differential/Platelet   Acute pain of right shoulder - Primary    Warm moist compresses Muscle relaxer  mobic  Tramadol for severe pain       Relevant Medications   meloxicam (MOBIC) 15 MG tablet   traMADol (ULTRAM) 50 MG tablet  Meds ordered this encounter  Medications   cyclobenzaprine (FLEXERIL) 10 MG tablet    Sig: Take 1 tablet (10 mg total) by mouth 3 (three) times daily as needed for muscle spasms.    Dispense:  30 tablet    Refill:  0   meloxicam (MOBIC) 15 MG tablet    Sig: Take 1 tablet (15 mg total) by mouth daily.    Dispense:  30 tablet    Refill:  0   traMADol (ULTRAM) 50 MG tablet    Sig: Take 1 tablet (50 mg total) by mouth every 8 (eight) hours as needed for up to 5 days.    Dispense:  15 tablet    Refill:  0    I, Terri Held, DO, personally preformed the services described in this documentation.  All medical record entries made by the scribe were at my direction and in my presence.  I have reviewed the chart and discharge instructions  (if applicable) and agree that the record reflects my personal performance and is accurate and complete. 08/16/2022   I,Amber Collins,acting as a scribe for Terri Held, DO.,have documented all relevant documentation on the behalf of Terri Held, DO,as directed by  Terri Held, DO while in the presence of Terri Held, DO.    Terri Held, DO

## 2022-08-17 LAB — CBC WITH DIFFERENTIAL/PLATELET
Basophils Absolute: 0.1 10*3/uL (ref 0.0–0.1)
Basophils Relative: 0.6 % (ref 0.0–3.0)
Eosinophils Absolute: 0.1 10*3/uL (ref 0.0–0.7)
Eosinophils Relative: 1.7 % (ref 0.0–5.0)
HCT: 33.6 % — ABNORMAL LOW (ref 36.0–46.0)
Hemoglobin: 11.3 g/dL — ABNORMAL LOW (ref 12.0–15.0)
Lymphocytes Relative: 35.3 % (ref 12.0–46.0)
Lymphs Abs: 3.1 10*3/uL (ref 0.7–4.0)
MCHC: 33.7 g/dL (ref 30.0–36.0)
MCV: 92.8 fl (ref 78.0–100.0)
Monocytes Absolute: 0.6 10*3/uL (ref 0.1–1.0)
Monocytes Relative: 6.6 % (ref 3.0–12.0)
Neutro Abs: 4.9 10*3/uL (ref 1.4–7.7)
Neutrophils Relative %: 55.8 % (ref 43.0–77.0)
Platelets: 294 10*3/uL (ref 150.0–400.0)
RBC: 3.62 Mil/uL — ABNORMAL LOW (ref 3.87–5.11)
RDW: 13.8 % (ref 11.5–15.5)
WBC: 8.8 10*3/uL (ref 4.0–10.5)

## 2022-08-24 ENCOUNTER — Other Ambulatory Visit (HOSPITAL_BASED_OUTPATIENT_CLINIC_OR_DEPARTMENT_OTHER): Payer: Self-pay

## 2022-08-27 ENCOUNTER — Other Ambulatory Visit: Payer: Self-pay | Admitting: Family Medicine

## 2022-08-27 ENCOUNTER — Telehealth: Payer: Self-pay | Admitting: Family

## 2022-08-27 ENCOUNTER — Other Ambulatory Visit (HOSPITAL_COMMUNITY): Payer: Self-pay

## 2022-08-27 DIAGNOSIS — M25511 Pain in right shoulder: Secondary | ICD-10-CM

## 2022-08-27 MED ORDER — PREDNISONE 10 MG PO TABS
ORAL_TABLET | ORAL | 0 refills | Status: AC
  Filled 2022-08-27: qty 20, 12d supply, fill #0

## 2022-08-27 NOTE — Telephone Encounter (Signed)
Patient states her shoulder is still causing her pain at night. Per her OV with Dr. Etter Sjogren on 10/30, she stated that [facility] would send her something to help her out if it didn't get better. Please advise

## 2022-08-27 NOTE — Telephone Encounter (Signed)
Patient advised rx was sent in

## 2022-08-28 ENCOUNTER — Other Ambulatory Visit (HOSPITAL_COMMUNITY): Payer: Self-pay

## 2022-08-30 ENCOUNTER — Ambulatory Visit: Payer: 59 | Admitting: Sports Medicine

## 2022-08-30 VITALS — BP 118/78 | HR 109 | Ht 59.0 in | Wt 178.0 lb

## 2022-08-30 DIAGNOSIS — M7551 Bursitis of right shoulder: Secondary | ICD-10-CM | POA: Diagnosis not present

## 2022-08-30 DIAGNOSIS — M25511 Pain in right shoulder: Secondary | ICD-10-CM | POA: Diagnosis not present

## 2022-08-30 NOTE — Progress Notes (Signed)
Terri Johnson Terri Johnson Phone: [phone]   Assessment and Plan:     1. Acute pain of right shoulder 2. Subacromial bursitis of right shoulder joint  -Acute, uncomplicated, initial sports medicine visit - Most consistent with subacromial bursitis likely flared after reaction from IM injection of flu shot - Patient is taken to 3-week course of meloxicam without improvement, recently started prednisone, been using tramadol, and overall has still had consistent pain significantly limited ROM. - Patient elected for subacromial CSI.  Tolerated well per note below.  Recommend discontinuing prednisone since CSI performed today - May continue previously prescribed meloxicam and tramadol as needed for day-to-day pain relief - Start HEP for shoulder  Procedure: Subacromial Injection Side: Right  Risks explained and consent was given verbally. The site was cleaned with alcohol prep. A steroid injection was performed from posterior approach using 61m of 1% lidocaine without epinephrine and 150mof kenalog '40mg'$ /ml. This was well tolerated and resulted in symptomatic relief.  Needle was removed, hemostasis achieved, and post injection instructions were explained.   Pt was advised to call or return to clinic if these symptoms worsen or fail to improve as anticipated.   Pertinent previous records reviewed include progress notes 08/16/2022, telephone encounter 08/27/2022, telephone encounter 08/27/2022, WBC from 08/16/2022   Follow Up: 3 weeks for reevaluation.  Could consider x-ray versus ultrasound versus physical therapy if no improvement or worsening of symptoms   Subjective:   I, Terri Johnson, am serving as a scEducation administratoror Doctor BeGlennon Johnson Complaint: Right shoulder pain   HPI:   08/30/22 Patient is a [age]ear old female complaining of right shoulder pain. Patient states She received an influenza vaccine  on 08/11/2022 and complains of right arm pain that radiates to the right side of her neck. When she initially received the injection, she reported of usual muscle aches however, symptoms worsened from then. She states that when she injected the shot, it was more painful than usual. During the evening, she tosses between different sides. She has taken Ibuprofen, Aleve, and Tylenol but symptoms are still persistent.  she states she has a weird sensation down her arm it feels heavy and dead   Relevant Historical Information: GERD,  Additional pertinent review of systems negative.   Current Outpatient Medications:    albuterol (VENTOLIN HFA) 108 (90 Base) MCG/ACT inhaler, INHALE 2 PUFFS INTO THE LUNGS EVERY 6 HOURS AS NEEDED FOR WHEEZING OR SHORTNESS OF BREATH., Disp: 6.7 g, Rfl: 1   betamethasone valerate ointment (VALISONE) 0.1 %, Apply small amount to ear as needed for itching/scaling., Disp: , Rfl:    buPROPion (WELLBUTRIN XL) 300 MG 24 hr tablet, TAKE 1 TABLET BY MOUTH DAILY., Disp: 90 tablet, Rfl: 1   cetirizine (ZYRTEC) 10 MG tablet, Take 10 mg by mouth daily., Disp: , Rfl:    Cholecalciferol (VITAMIN D3) 125 MCG (5000 UT) CHEW, Chew by mouth., Disp: , Rfl:    cyanocobalamin (DODEX) 1000 MCG/ML injection, Inject 1 mL every 30 days., Disp: 12 mL, Rfl: 4   cyclobenzaprine (FLEXERIL) 10 MG tablet, Take 1 tablet (10 mg total) by mouth 3 (three) times daily as needed for muscle spasms., Disp: 30 tablet, Rfl: 0   cycloSPORINE (RESTASIS) 0.05 % ophthalmic emulsion, Instill 1 drop into both eyes twice a day, Disp: 180 each, Rfl: 3   escitalopram (LEXAPRO) 20 MG tablet, Take 1 tablet (20 mg total) by mouth daily.,  Disp: 90 tablet, Rfl: 0   fluticasone (FLONASE) 50 MCG/ACT nasal spray, Place 2 sprays into both nostrils daily., Disp: 16 g, Rfl: 6   hydrOXYzine (ATARAX) 10 MG tablet, Take 1 tablet (10 mg total) by mouth 3 (three) times daily as needed for itching., Disp: 21 tablet, Rfl: 0   meloxicam  (MOBIC) 15 MG tablet, Take 1 tablet (15 mg total) by mouth daily., Disp: 30 tablet, Rfl: 0   montelukast (SINGULAIR) 10 MG tablet, TAKE 1 TABLET BY MOUTH AT BEDTIME, Disp: 90 tablet, Rfl: 1   omeprazole (PRILOSEC) 40 MG capsule, Take 1 capsule (40 mg total) by mouth daily., Disp: 90 capsule, Rfl: 1   predniSONE (DELTASONE) 10 MG tablet, Take 3 tablets by mouth daily for 3 days--take 2 tablets daily for 3 days--take 1 tablet daily for 3 days--take 1/2 tablet daily for 3 days., Disp: 20 tablet, Rfl: 0   SYRINGE-NEEDLE, DISP, 3 ML (B-D 3CC LUER-LOK SYR 25GX1") 25G X 1" 3 ML MISC, USE AS DIRECTED WITH VIT B12 INJECTION, Disp: 12 each, Rfl: 0   Objective:     Vitals:   08/30/22 1542  BP: 118/78  Pulse: (!) 109  SpO2: 95%  Weight: 178 lb (80.7 kg)  Height: '4\' 11"'$  (1.499 m)      Body mass index is 35.95 kg/m.    Physical Exam:    Gen: Appears well, nad, nontoxic and pleasant Neuro:sensation intact, strength is 5/5 with df/pf/inv/ev, muscle tone wnl Skin: no suspicious lesion or defmority Psych: A&O, appropriate mood and affect  Right shoulder: no deformity, swelling or muscle wasting No scapular winging FF 80, abd 70, int 20, ext 70 TTP biceps groove, humerus, deltoid, trapezius NTTP over the [place], clavicle, ac, coracoid,   cervical spine Equivocal pan positive special testing with tenderness throughout Neg ant drawer, sulcus sign, apprehension Negative Spurling's test bilat FROM of neck    Electronically signed by:  Terri Johnson D.[facility] 4:17 PM 08/30/22

## 2022-08-30 NOTE — Patient Instructions (Addendum)
Good to see you Shoulder HEP 3 week follow up  

## 2022-09-17 NOTE — Progress Notes (Unsigned)
Terri Johnson D.[facility] Phone: [phone]   Assessment and Plan:     There are no diagnoses linked to this encounter.  ***   Pertinent previous records reviewed include ***   Follow Up: ***     Subjective:   I, Terri Johnson, am serving as a Education administrator for Doctor Glennon Mac   Chief Complaint: Right shoulder pain    HPI:    08/30/22 Patient is a 56 year old female complaining of right shoulder pain. Patient states She received an influenza vaccine on 08/11/2022 and complains of right arm pain that radiates to the right side of her neck. When she initially received the injection, she reported of usual muscle aches however, symptoms worsened from then. She states that when she injected the shot, it was more painful than usual. During the evening, she tosses between different sides. She has taken Ibuprofen, Aleve, and Tylenol but symptoms are still persistent.  she states she has a weird sensation down her arm it feels heavy and dead   2022-10-01 Patient states   Relevant Historical Information: GERD,    Additional pertinent review of systems negative.   Current Outpatient Medications:    albuterol (VENTOLIN HFA) 108 (90 Base) MCG/ACT inhaler, INHALE 2 PUFFS INTO THE LUNGS EVERY 6 HOURS AS NEEDED FOR WHEEZING OR SHORTNESS OF BREATH., Disp: 6.7 g, Rfl: 1   betamethasone valerate ointment (VALISONE) 0.1 %, Apply small amount to ear as needed for itching/scaling., Disp: , Rfl:    buPROPion (WELLBUTRIN XL) 300 MG 24 hr tablet, TAKE 1 TABLET BY MOUTH DAILY., Disp: 90 tablet, Rfl: 1   cetirizine (ZYRTEC) 10 MG tablet, Take 10 mg by mouth daily., Disp: , Rfl:    Cholecalciferol (VITAMIN D3) 125 MCG (5000 UT) CHEW, Chew by mouth., Disp: , Rfl:    cyanocobalamin (DODEX) 1000 MCG/ML injection, Inject 1 mL every 30 days., Disp: 12 mL, Rfl: 4   cyclobenzaprine (FLEXERIL) 10 MG tablet, Take 1 tablet (10 mg  total) by mouth 3 (three) times daily as needed for muscle spasms., Disp: 30 tablet, Rfl: 0   cycloSPORINE (RESTASIS) 0.05 % ophthalmic emulsion, Instill 1 drop into both eyes twice a day, Disp: 180 each, Rfl: 3   escitalopram (LEXAPRO) 20 MG tablet, Take 1 tablet (20 mg total) by mouth daily., Disp: 90 tablet, Rfl: 0   fluticasone (FLONASE) 50 MCG/ACT nasal spray, Place 2 sprays into both nostrils daily., Disp: 16 g, Rfl: 6   hydrOXYzine (ATARAX) 10 MG tablet, Take 1 tablet (10 mg total) by mouth 3 (three) times daily as needed for itching., Disp: 21 tablet, Rfl: 0   meloxicam (MOBIC) 15 MG tablet, Take 1 tablet (15 mg total) by mouth daily., Disp: 30 tablet, Rfl: 0   montelukast (SINGULAIR) 10 MG tablet, TAKE 1 TABLET BY MOUTH AT BEDTIME, Disp: 90 tablet, Rfl: 1   omeprazole (PRILOSEC) 40 MG capsule, Take 1 capsule (40 mg total) by mouth daily., Disp: 90 capsule, Rfl: 1   SYRINGE-NEEDLE, DISP, 3 ML (B-D 3CC LUER-LOK SYR 25GX1") 25G X 1" 3 ML MISC, USE AS DIRECTED WITH VIT B12 INJECTION, Disp: 12 each, Rfl: 0   Objective:     There were no vitals filed for this visit.    There is no height or weight on file to calculate BMI.    Physical Exam:    ***   Electronically signed by:  Terri Johnson D.[facility] 7:40 AM 09/17/22

## 2022-09-20 ENCOUNTER — Ambulatory Visit (INDEPENDENT_AMBULATORY_CARE_PROVIDER_SITE_OTHER): Payer: 59

## 2022-09-20 ENCOUNTER — Ambulatory Visit: Payer: 59 | Admitting: Sports Medicine

## 2022-09-20 ENCOUNTER — Other Ambulatory Visit (HOSPITAL_COMMUNITY): Payer: Self-pay

## 2022-09-20 VITALS — BP 132/78 | HR 107 | Ht 59.0 in | Wt 176.0 lb

## 2022-09-20 DIAGNOSIS — M7551 Bursitis of right shoulder: Secondary | ICD-10-CM

## 2022-09-20 DIAGNOSIS — M25511 Pain in right shoulder: Secondary | ICD-10-CM

## 2022-09-20 MED ORDER — CYCLOBENZAPRINE HCL 5 MG PO TABS
5.0000 mg | ORAL_TABLET | Freq: Every day | ORAL | 0 refills | Status: DC
  Filled 2022-09-20: qty 30, 30d supply, fill #0

## 2022-09-20 NOTE — Patient Instructions (Addendum)
Good to see you  Voltaren gel over areas of pain  Continue HEP Flexeril 5-10 mg nightly as needed for muscle spasm 2-3 week follow up

## 2022-09-21 ENCOUNTER — Other Ambulatory Visit (HOSPITAL_COMMUNITY): Payer: Self-pay

## 2022-09-24 ENCOUNTER — Ambulatory Visit: Payer: 59 | Admitting: Family

## 2022-09-24 ENCOUNTER — Encounter: Payer: Self-pay | Admitting: Family

## 2022-09-24 ENCOUNTER — Other Ambulatory Visit (HOSPITAL_BASED_OUTPATIENT_CLINIC_OR_DEPARTMENT_OTHER): Payer: Self-pay

## 2022-09-24 VITALS — BP 150/85 | HR 109 | Temp 98.3°F | Resp 16 | Ht 59.0 in | Wt 174.0 lb

## 2022-09-24 DIAGNOSIS — J0100 Acute maxillary sinusitis, unspecified: Secondary | ICD-10-CM | POA: Insufficient documentation

## 2022-09-24 DIAGNOSIS — R030 Elevated blood-pressure reading, without diagnosis of hypertension: Secondary | ICD-10-CM | POA: Diagnosis not present

## 2022-09-24 MED ORDER — BENZONATATE 100 MG PO CAPS
100.0000 mg | ORAL_CAPSULE | Freq: Three times a day (TID) | ORAL | 0 refills | Status: DC | PRN
  Filled 2022-09-24: qty 20, 7d supply, fill #0

## 2022-09-24 MED ORDER — FLUCONAZOLE 150 MG PO TABS
150.0000 mg | ORAL_TABLET | Freq: Once | ORAL | 0 refills | Status: AC
  Filled 2022-09-24: qty 1, 1d supply, fill #0

## 2022-09-24 MED ORDER — AMOXICILLIN-POT CLAVULANATE 875-125 MG PO TABS
1.0000 | ORAL_TABLET | Freq: Two times a day (BID) | ORAL | 0 refills | Status: DC
Start: 2022-09-24 — End: 2022-12-20
  Filled 2022-09-24: qty 20, 10d supply, fill #0

## 2022-09-24 NOTE — Patient Instructions (Signed)
Please begin augmenti twice daily for sinus infection. You may use tessalon every 8 hours as needed for cough. You may use mucinex '600mg'$  twice daily for congestion. Call if symptoms worsen or if not improved in 3-4 days.

## 2022-09-24 NOTE — Assessment & Plan Note (Signed)
BP Readings from Last 3 Encounters:  09/24/22 (!) 150/85  09/20/22 132/78  08/30/22 118/78   BP is elevated today. Plan to recheck in 1 month. If still elevated will plan to treat.

## 2022-09-24 NOTE — Assessment & Plan Note (Signed)
New.  Pt is advised as follows:  Please begin augmenti twice daily for sinus infection. You may use tessalon every 8 hours as needed for cough. You may use mucinex '600mg'$  twice daily for congestion. Call if symptoms worsen or if not improved in 3-4 days.

## 2022-09-24 NOTE — Progress Notes (Signed)
Subjective:   By signing my name below, I, [name], attest that this documentation has been prepared under the direction and in the presence of [name], NP 09/24/2022    Patient ID: Terri Johnson, female    DOB: 10/19/1965, 56 y.o.   MRN: [id number]  Chief Complaint  Patient presents with   Follow-up   Cough    X3 weeks, cough worse at night, sneezing, wheezing, denies fever    HPI Patient is in today for an office visit  Productive Cough: Patient presents with congestion, productive coughing, dizziness, and sneezing. She reports symptoms begun about 2 1/2 weeks ago. Symptoms begun as a congestion/pressure in head and coughing. She notes that she was experiencing nausea on 09/23/2022 and her productive cough does keep her up during the night. She took a Covid test last weekend and results were negative. Her blood pressure is elevated BP Readings from Last 3 Encounters:  09/24/22 (!) 150/85  09/20/22 132/78  08/30/22 118/78   Pulse Readings from Last 3 Encounters:  09/24/22 (!) 109  09/20/22 (!) 107  08/30/22 (!) 109    Health Maintenance Due  Topic Date Due   PAP SMEAR-Modifier  06/23/2018   Zoster Vaccines- Shingrix (2 of 2) 07/20/2019   DEXA SCAN  08/19/2019   COVID-19 Vaccine (3 - 2023-24 season) 06/18/2022    Past Medical History:  Diagnosis Date   Allergy    Asthma    B12 deficiency 05/14/2015   Depression    GERD (gastroesophageal reflux disease)    History of chicken pox    Migraines    OSA (obstructive sleep apnea) 03/2016    Past Surgical History:  Procedure Laterality Date   [place]  2007   eardrum repair - left Left    INNER EAR SURGERY     Pt states she had her "eardrum rebuilt". Has had 8 sets of tubes in ears through [employer]   MYRINGOTOMY Bilateral    twice each.   TONSILLECTOMY AND ADENOIDECTOMY  1976   TUBAL LIGATION  2001    Family History  Problem Relation Age of Onset    Hyperlipidemia Mother    Heart disease Mother    Hypertension Mother    Depression Mother    Parkinson's disease Father    Depression Maternal Grandmother    Heart disease Maternal Grandfather    Hypertension Maternal Grandfather    Alcohol abuse Maternal Grandfather    AVM Maternal Aunt        congenital   Aneurysm Maternal Aunt    Depression Maternal Aunt    Alcohol abuse Maternal Uncle     Social History   Socioeconomic History   Marital status: Married    Spouse name: Not on file   Number of children: Not on file   Years of education: Not on file   Highest education level: Not on file  Occupational History   Not on file  Tobacco Use   Smoking status: Never   Smokeless tobacco: Never  Substance and Sexual Activity   Alcohol use: No    Alcohol/week: 0.0 standard drinks of alcohol   Drug use: No   Sexual activity: Not on file  Other Topics Concern   Not on file  Social History Narrative   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    Has associates degree   Enjoys painting, animals, antiquing, shopping, reading,  movies   Social Determinants of Health   Financial Resource Strain: Not on file  Food Insecurity: Not on file  Transportation Needs: Not on file  Physical Activity: Not on file  Stress: Not on file  Social Connections: Not on file  Intimate Partner Violence: Not on file    Outpatient Medications Prior to Visit  Medication Sig Dispense Refill   albuterol (VENTOLIN HFA) 108 (90 Base) MCG/ACT inhaler INHALE 2 PUFFS INTO THE LUNGS EVERY 6 HOURS AS NEEDED FOR WHEEZING OR SHORTNESS OF BREATH. 6.7 g 1   betamethasone valerate ointment (VALISONE) 0.1 % Apply small amount to ear as needed for itching/scaling.     buPROPion (WELLBUTRIN XL) 300 MG 24 hr tablet TAKE 1 TABLET BY MOUTH DAILY. 90 tablet 1   cetirizine (ZYRTEC) 10 MG tablet Take 10 mg by mouth daily.     Cholecalciferol (VITAMIN D3) 125 MCG (5000 UT) CHEW Chew by  mouth.     cyanocobalamin (DODEX) 1000 MCG/ML injection Inject 1 mL every 30 days. 12 mL 4   cyclobenzaprine (FLEXERIL) 5 MG tablet Take 1 tablet (5 mg total) by mouth at bedtime. 30 tablet 0   cycloSPORINE (RESTASIS) 0.05 % ophthalmic emulsion Instill 1 drop into both eyes twice a day 180 each 3   escitalopram (LEXAPRO) 20 MG tablet Take 1 tablet (20 mg total) by mouth daily. 90 tablet 0   fluticasone (FLONASE) 50 MCG/ACT nasal spray Place 2 sprays into both nostrils daily. 16 g 6   hydrOXYzine (ATARAX) 10 MG tablet Take 1 tablet (10 mg total) by mouth 3 (three) times daily as needed for itching. 21 tablet 0   meloxicam (MOBIC) 15 MG tablet Take 1 tablet (15 mg total) by mouth daily. 30 tablet 0   montelukast (SINGULAIR) 10 MG tablet TAKE 1 TABLET BY MOUTH AT BEDTIME 90 tablet 1   omeprazole (PRILOSEC) 40 MG capsule Take 1 capsule (40 mg total) by mouth daily. 90 capsule 1   SYRINGE-NEEDLE, DISP, 3 ML (B-D 3CC LUER-LOK SYR 25GX1") 25G X 1" 3 ML MISC USE AS DIRECTED WITH VIT B12 INJECTION 12 each 0   cyclobenzaprine (FLEXERIL) 10 MG tablet Take 1 tablet (10 mg total) by mouth 3 (three) times daily as needed for muscle spasms. 30 tablet 0   No facility-administered medications prior to visit.    Allergies  Allergen Reactions   Aspirin Other (See Comments)    Gi/ringing ears   Tetracycline Rash    Rash/throat swells    Review of Systems  Respiratory:  Positive for cough (Productive) and sputum production.        (+) Sneezing  Neurological:  Positive for dizziness.       Objective:    Physical Exam Constitutional:      General: She is not in acute distress.    Appearance: Normal appearance. She is not ill-appearing.  HENT:     Head: Normocephalic and atraumatic.     Right Ear: Tympanic membrane, ear canal and external ear normal. There is impacted cerumen (Partial, Normal TM).     Left Ear: Tympanic membrane, ear canal and external ear normal. There is impacted cerumen.      Nose:     Right Sinus: Maxillary sinus tenderness present.     Left Sinus: Maxillary sinus tenderness present.  Eyes:     Extraocular Movements: Extraocular movements intact.     Pupils: Pupils are equal, round, and reactive to light.  Cardiovascular:     Rate and Rhythm:  Normal rate and regular rhythm.     Heart sounds: Normal heart sounds. No murmur heard.    No gallop.  Pulmonary:     Effort: Pulmonary effort is normal. No respiratory distress.     Breath sounds: Normal breath sounds. No wheezing or rales.  Skin:    General: Skin is warm and dry.  Neurological:     Mental Status: She is alert and oriented to person, place, and time.  Psychiatric:        Mood and Affect: Mood normal.        Behavior: Behavior normal.        Judgment: Judgment normal.     BP (!) 150/85 (BP Location: Right Arm, Patient Position: Sitting)   Pulse (!) 109   Temp 98.3 F (36.8 C) (Oral)   Resp 16   Ht '4\' 11"'$  (1.499 m)   Wt 174 lb (78.9 kg)   LMP 03/23/2014   SpO2 95%   BMI 35.14 kg/m  Wt Readings from Last 3 Encounters:  09/24/22 174 lb (78.9 kg)  09/20/22 176 lb (79.8 kg)  08/30/22 178 lb (80.7 kg)       Assessment & Plan:   Problem List Items Addressed This Visit       Unprioritized   Elevated blood pressure reading    BP Readings from Last 3 Encounters:  09/24/22 (!) 150/85  09/20/22 132/78  08/30/22 118/78  BP is elevated today. Plan to recheck in 1 month. If still elevated will plan to treat.       Acute maxillary sinusitis - Primary    New.  Pt is advised as follows:  Please begin augmenti twice daily for sinus infection. You may use tessalon every 8 hours as needed for cough. You may use mucinex '600mg'$  twice daily for congestion. Call if symptoms worsen or if not improved in 3-4 days.       Relevant Medications   amoxicillin-clavulanate (AUGMENTIN) 875-125 MG tablet   fluconazole (DIFLUCAN) 150 MG tablet   benzonatate (TESSALON) 100 MG capsule   Meds ordered  this encounter  Medications   amoxicillin-clavulanate (AUGMENTIN) 875-125 MG tablet    Sig: Take 1 tablet by mouth 2 (two) times daily.    Dispense:  20 tablet    Refill:  0    Order Specific Question:   Supervising Provider    Answer:   Penni Homans A [[id number]]   fluconazole (DIFLUCAN) 150 MG tablet    Sig: Take 1 tablet (150 mg total) by mouth once for 1 dose. As needed for vaginal yeast infection    Dispense:  1 tablet    Refill:  0    Order Specific Question:   Supervising Provider    Answer:   Penni Homans A [[id number]]   benzonatate (TESSALON) 100 MG capsule    Sig: Take 1 capsule (100 mg total) by mouth 3 (three) times daily as needed.    Dispense:  20 capsule    Refill:  0    Order Specific Question:   Supervising Provider    Answer:   Penni Homans A [[id number]]    I, Nance Pear, NP, personally preformed the services described in this documentation.  All medical record entries made by the scribe were at my direction and in my presence.  I have reviewed the chart and discharge instructions (if applicable) and agree that the record reflects my personal performance and is accurate and complete. 09/24/2022   I,Amber Collins,acting as a scribe  for Nance Pear, NP.,have documented all relevant documentation on the behalf of Nance Pear, NP,as directed by  Nance Pear, NP while in the presence of Nance Pear, NP.    Nance Pear, NP

## 2022-10-01 NOTE — Progress Notes (Unsigned)
[name] D.[place] Phone: [phone]   Assessment and Plan:     There are no diagnoses linked to this encounter.  ***   Pertinent previous records reviewed include ***   Follow Up: ***     Subjective:   I, Terri Johnson, am serving as a Education administrator for Doctor Glennon Mac   Chief Complaint: Right shoulder pain    HPI:    08/30/22 Patient is a 56 year old female complaining of right shoulder pain. Patient states She received an influenza vaccine on 08/11/2022 and complains of right arm pain that radiates to the right side of her neck. When she initially received the injection, she reported of usual muscle aches however, symptoms worsened from then. She states that when she injected the shot, it was more painful than usual. During the evening, she tosses between different sides. She has taken Ibuprofen, Aleve, and Tylenol but symptoms are still persistent.  she states she has a weird sensation down her arm it feels heavy and dead    27-Sep-2022 Patient states she has pain at night and up through her trap she has more ROM, but still has painful arcs    10/04/2022 Patient states   Relevant Historical Information: GERD,  Additional pertinent review of systems negative.   Current Outpatient Medications:    albuterol (VENTOLIN HFA) 108 (90 Base) MCG/ACT inhaler, INHALE 2 PUFFS INTO THE LUNGS EVERY 6 HOURS AS NEEDED FOR WHEEZING OR SHORTNESS OF BREATH., Disp: 6.7 g, Rfl: 1   amoxicillin-clavulanate (AUGMENTIN) 875-125 MG tablet, Take 1 tablet by mouth 2 (two) times daily., Disp: 20 tablet, Rfl: 0   benzonatate (TESSALON) 100 MG capsule, Take 1 capsule (100 mg total) by mouth 3 (three) times daily as needed., Disp: 20 capsule, Rfl: 0   betamethasone valerate ointment (VALISONE) 0.1 %, Apply small amount to ear as needed for itching/scaling., Disp: , Rfl:    buPROPion (WELLBUTRIN XL) 300 MG 24 hr  tablet, TAKE 1 TABLET BY MOUTH DAILY., Disp: 90 tablet, Rfl: 1   cetirizine (ZYRTEC) 10 MG tablet, Take 10 mg by mouth daily., Disp: , Rfl:    Cholecalciferol (VITAMIN D3) 125 MCG (5000 UT) CHEW, Chew by mouth., Disp: , Rfl:    cyanocobalamin (DODEX) 1000 MCG/ML injection, Inject 1 mL every 30 days., Disp: 12 mL, Rfl: 4   cyclobenzaprine (FLEXERIL) 5 MG tablet, Take 1 tablet (5 mg total) by mouth at bedtime., Disp: 30 tablet, Rfl: 0   cycloSPORINE (RESTASIS) 0.05 % ophthalmic emulsion, Instill 1 drop into both eyes twice a day, Disp: 180 each, Rfl: 3   escitalopram (LEXAPRO) 20 MG tablet, Take 1 tablet (20 mg total) by mouth daily., Disp: 90 tablet, Rfl: 0   fluticasone (FLONASE) 50 MCG/ACT nasal spray, Place 2 sprays into both nostrils daily., Disp: 16 g, Rfl: 6   hydrOXYzine (ATARAX) 10 MG tablet, Take 1 tablet (10 mg total) by mouth 3 (three) times daily as needed for itching., Disp: 21 tablet, Rfl: 0   meloxicam (MOBIC) 15 MG tablet, Take 1 tablet (15 mg total) by mouth daily., Disp: 30 tablet, Rfl: 0   montelukast (SINGULAIR) 10 MG tablet, TAKE 1 TABLET BY MOUTH AT BEDTIME, Disp: 90 tablet, Rfl: 1   omeprazole (PRILOSEC) 40 MG capsule, Take 1 capsule (40 mg total) by mouth daily., Disp: 90 capsule, Rfl: 1   SYRINGE-NEEDLE, DISP, 3 ML (B-D 3CC LUER-LOK SYR 25GX1") 25G X 1" 3  ML MISC, USE AS DIRECTED WITH VIT B12 INJECTION, Disp: 12 each, Rfl: 0   Objective:     There were no vitals filed for this visit.    There is no height or weight on file to calculate BMI.    Physical Exam:    ***   Electronically signed by:  [name] D.[facility] 7:41 AM 10/01/22

## 2022-10-04 ENCOUNTER — Ambulatory Visit: Payer: 59 | Admitting: Sports Medicine

## 2022-10-04 ENCOUNTER — Ambulatory Visit: Payer: Self-pay

## 2022-10-04 VITALS — BP 118/82 | HR 122 | Ht 59.0 in | Wt 174.0 lb

## 2022-10-04 DIAGNOSIS — M25511 Pain in right shoulder: Secondary | ICD-10-CM | POA: Diagnosis not present

## 2022-10-04 NOTE — Patient Instructions (Addendum)
Good to see you  Continue HEP  3 week follow up

## 2022-10-20 ENCOUNTER — Other Ambulatory Visit: Payer: Self-pay | Admitting: Family

## 2022-10-21 ENCOUNTER — Other Ambulatory Visit: Payer: Self-pay

## 2022-10-21 ENCOUNTER — Other Ambulatory Visit (HOSPITAL_COMMUNITY): Payer: Self-pay

## 2022-10-21 MED ORDER — BUPROPION HCL ER (XL) 300 MG PO TB24
300.0000 mg | ORAL_TABLET | Freq: Every day | ORAL | 1 refills | Status: DC
  Filled 2022-10-21: qty 90, 90d supply, fill #0
  Filled 2023-02-02: qty 90, 90d supply, fill #1

## 2022-10-22 ENCOUNTER — Ambulatory Visit: Payer: Commercial Managed Care - PPO | Admitting: Sports Medicine

## 2022-10-29 ENCOUNTER — Ambulatory Visit: Payer: Self-pay | Admitting: Family

## 2022-11-05 ENCOUNTER — Ambulatory Visit: Payer: Commercial Managed Care - PPO | Admitting: Sports Medicine

## 2022-11-05 NOTE — Progress Notes (Signed)
[name] D.[place] Phone: [phone]   Assessment and Plan:     1. Chronic right shoulder pain 2. Arthralgia of right acromioclavicular joint 3.  Calcific tendinitis of right shoulder joint  -Chronic with exacerbation, subsequent visit - Unclear etiology of continued right shoulder pain, though could be related to calcific tendinitis.  Patient has received mild improvement in symptoms and mild increase in ROM after subacromial CSI on 08/30/2022 and AC joint CSI on 10/04/2022.  Both CSI provided some pain relief, however pain returned within 3 weeks of injection. - Based on failure to improve with >6 weeks of conservative therapy, pain still frequently >6/10, pain affecting day-to-day symptoms, x-ray imaging, we will proceed with right shoulder MRI with contrast - Continue HEP and Tylenol as needed for pain relief - May continue to use Flexeril as needed  Pertinent previous records reviewed include none   Follow Up: 3 days after MRI to review results and discuss treatment plan   Subjective:   I, Terri Johnson, am serving as a Education administrator for Doctor Glennon Mac   Chief Complaint: Right shoulder pain    HPI:    08/30/22 Patient is a 57 year old female complaining of right shoulder pain. Patient states She received an influenza vaccine on 08/11/2022 and complains of right arm pain that radiates to the right side of her neck. When she initially received the injection, she reported of usual muscle aches however, symptoms worsened from then. She states that when she injected the shot, it was more painful than usual. During the evening, she tosses between different sides. She has taken Ibuprofen, Aleve, and Tylenol but symptoms are still persistent.  she states she has a weird sensation down her arm it feels heavy and dead    10/09/22 Patient states she has pain at night and up through her trap she has  more ROM, but still has painful arcs    10/04/2022 Patient states she says she is good when taking the flexeril and voltaren but in pain when she doesn't take it   11/08/2022 Patient states she is hurting, CSI lasted 3 weeks    Relevant Historical Information: GERD,  Additional pertinent review of systems negative.   Current Outpatient Medications:    albuterol (VENTOLIN HFA) 108 (90 Base) MCG/ACT inhaler, INHALE 2 PUFFS INTO THE LUNGS EVERY 6 HOURS AS NEEDED FOR WHEEZING OR SHORTNESS OF BREATH., Disp: 6.7 g, Rfl: 1   amoxicillin-clavulanate (AUGMENTIN) 875-125 MG tablet, Take 1 tablet by mouth 2 (two) times daily., Disp: 20 tablet, Rfl: 0   benzonatate (TESSALON) 100 MG capsule, Take 1 capsule (100 mg total) by mouth 3 (three) times daily as needed., Disp: 20 capsule, Rfl: 0   betamethasone valerate ointment (VALISONE) 0.1 %, Apply small amount to ear as needed for itching/scaling., Disp: , Rfl:    buPROPion (WELLBUTRIN XL) 300 MG 24 hr tablet, Take 1 tablet (300 mg total) by mouth daily., Disp: 90 tablet, Rfl: 1   cetirizine (ZYRTEC) 10 MG tablet, Take 10 mg by mouth daily., Disp: , Rfl:    Cholecalciferol (VITAMIN D3) 125 MCG (5000 UT) CHEW, Chew by mouth., Disp: , Rfl:    cyanocobalamin (DODEX) 1000 MCG/ML injection, Inject 1 mL every 30 days., Disp: 12 mL, Rfl: 4   cyclobenzaprine (FLEXERIL) 5 MG tablet, Take 1 tablet (5 mg total) by mouth at bedtime., Disp: 30 tablet, Rfl: 0   cycloSPORINE (RESTASIS) 0.05 % ophthalmic  emulsion, Instill 1 drop into both eyes twice a day, Disp: 180 each, Rfl: 3   escitalopram (LEXAPRO) 20 MG tablet, Take 1 tablet (20 mg total) by mouth daily., Disp: 90 tablet, Rfl: 0   fluticasone (FLONASE) 50 MCG/ACT nasal spray, Place 2 sprays into both nostrils daily., Disp: 16 g, Rfl: 6   hydrOXYzine (ATARAX) 10 MG tablet, Take 1 tablet (10 mg total) by mouth 3 (three) times daily as needed for itching., Disp: 21 tablet, Rfl: 0   meloxicam (MOBIC) 15 MG tablet,  Take 1 tablet (15 mg total) by mouth daily., Disp: 30 tablet, Rfl: 0   montelukast (SINGULAIR) 10 MG tablet, TAKE 1 TABLET BY MOUTH AT BEDTIME, Disp: 90 tablet, Rfl: 1   omeprazole (PRILOSEC) 40 MG capsule, Take 1 capsule (40 mg total) by mouth daily., Disp: 90 capsule, Rfl: 1   SYRINGE-NEEDLE, DISP, 3 ML (B-D 3CC LUER-LOK SYR 25GX1") 25G X 1" 3 ML MISC, USE AS DIRECTED WITH VIT B12 INJECTION, Disp: 12 each, Rfl: 0   Objective:     Vitals:   11/08/22 1524  BP: 118/82  Pulse: (!) 125  SpO2: 99%  Weight: 180 lb (81.6 kg)  Height: '4\' 11"'$  (1.499 m)      Body mass index is 36.36 kg/m.    Physical Exam:    Gen: Appears well, nad, nontoxic and pleasant Neuro:sensation intact, strength is 5/5 with df/pf/inv/ev, muscle tone wnl Skin: no suspicious lesion or defmority Psych: A&O, appropriate mood and affect   Right shoulder: no deformity, swelling or muscle wasting No scapular winging FF 100, abd 90, int 20, ext 70 TTP AC, humerus, deltoid, trapezius NTTP over the [place], clavicle,   coracoid,   cervical spine Positive crossarm, Neer, Hawking's, O'Brien Negative empty can, speed, subscap liftoff Neg ant drawer, sulcus sign, apprehension Negative Spurling's test bilat FROM of neck   Electronically signed by:  [name] D.[facility] 3:43 PM 11/08/22

## 2022-11-08 ENCOUNTER — Ambulatory Visit: Payer: Commercial Managed Care - PPO | Admitting: Sports Medicine

## 2022-11-08 VITALS — BP 118/82 | HR 125 | Ht 59.0 in | Wt 180.0 lb

## 2022-11-08 DIAGNOSIS — M25511 Pain in right shoulder: Secondary | ICD-10-CM | POA: Diagnosis not present

## 2022-11-08 DIAGNOSIS — M7531 Calcific tendinitis of right shoulder: Secondary | ICD-10-CM

## 2022-11-08 DIAGNOSIS — G8929 Other chronic pain: Secondary | ICD-10-CM

## 2022-11-08 NOTE — Patient Instructions (Signed)
Good to see you MRI referral  Follow up 3 days after to discuss results  

## 2022-12-03 ENCOUNTER — Other Ambulatory Visit: Payer: Self-pay

## 2022-12-03 ENCOUNTER — Other Ambulatory Visit: Payer: Self-pay | Admitting: Family

## 2022-12-03 ENCOUNTER — Other Ambulatory Visit: Payer: Self-pay | Admitting: Sports Medicine

## 2022-12-03 MED ORDER — MONTELUKAST SODIUM 10 MG PO TABS
10.0000 mg | ORAL_TABLET | Freq: Every day | ORAL | 1 refills | Status: DC
  Filled 2022-12-03: qty 90, 90d supply, fill #0
  Filled 2023-04-25: qty 90, 90d supply, fill #1

## 2022-12-07 ENCOUNTER — Other Ambulatory Visit (HOSPITAL_COMMUNITY): Payer: Self-pay

## 2022-12-08 ENCOUNTER — Inpatient Hospital Stay: Admission: RE | Admit: 2022-12-08 | Payer: Commercial Managed Care - PPO | Source: Ambulatory Visit

## 2022-12-08 ENCOUNTER — Ambulatory Visit
Admission: RE | Admit: 2022-12-08 | Discharge: 2022-12-08 | Disposition: A | Discharge status: Home / Self Care | Payer: Commercial Managed Care - PPO | Source: Ambulatory Visit | Attending: Sports Medicine | Admitting: Sports Medicine

## 2022-12-08 DIAGNOSIS — M25511 Pain in right shoulder: Secondary | ICD-10-CM

## 2022-12-08 DIAGNOSIS — G8929 Other chronic pain: Secondary | ICD-10-CM

## 2022-12-08 DIAGNOSIS — M67813 Other specified disorders of tendon, right shoulder: Secondary | ICD-10-CM | POA: Diagnosis not present

## 2022-12-08 DIAGNOSIS — M7531 Calcific tendinitis of right shoulder: Secondary | ICD-10-CM

## 2022-12-08 MED ORDER — IOPAMIDOL (ISOVUE-M 200) INJECTION 41%
12.0000 mL | Freq: Once | INTRAMUSCULAR | Status: AC
  Administered 2022-12-08: 12 mL via INTRA_ARTICULAR

## 2022-12-20 ENCOUNTER — Encounter: Payer: Self-pay | Admitting: Internal Medicine

## 2022-12-20 ENCOUNTER — Other Ambulatory Visit (HOSPITAL_BASED_OUTPATIENT_CLINIC_OR_DEPARTMENT_OTHER): Payer: Self-pay

## 2022-12-20 ENCOUNTER — Ambulatory Visit: Payer: Commercial Managed Care - PPO | Admitting: Internal Medicine

## 2022-12-20 VITALS — BP 130/76 | HR 110 | Temp 98.2°F | Resp 18 | Ht 59.0 in | Wt 173.4 lb

## 2022-12-20 DIAGNOSIS — B349 Viral infection, unspecified: Secondary | ICD-10-CM

## 2022-12-20 DIAGNOSIS — J4541 Moderate persistent asthma with (acute) exacerbation: Secondary | ICD-10-CM | POA: Diagnosis not present

## 2022-12-20 DIAGNOSIS — J069 Acute upper respiratory infection, unspecified: Secondary | ICD-10-CM

## 2022-12-20 LAB — POC COVID19 BINAXNOW: SARS Coronavirus 2 Ag: NEGATIVE

## 2022-12-20 LAB — POCT INFLUENZA A/B
Influenza A, POC: NEGATIVE
Influenza B, POC: NEGATIVE

## 2022-12-20 MED ORDER — PREDNISONE 10 MG PO TABS
ORAL_TABLET | ORAL | 0 refills | Status: DC
  Filled 2022-12-20: qty 20, 8d supply, fill #0

## 2022-12-20 MED ORDER — BENZONATATE 200 MG PO CAPS
200.0000 mg | ORAL_CAPSULE | Freq: Three times a day (TID) | ORAL | 0 refills | Status: DC | PRN
Start: 2022-12-20 — End: 2023-07-06
  Filled 2022-12-20: qty 20, 7d supply, fill #0

## 2022-12-20 MED ORDER — ALBUTEROL SULFATE HFA 108 (90 BASE) MCG/ACT IN AERS
2.0000 | INHALATION_SPRAY | Freq: Four times a day (QID) | RESPIRATORY_TRACT | 1 refills | Status: DC | PRN
  Filled 2022-12-20: qty 6.7, 21d supply, fill #0

## 2022-12-20 MED ORDER — AZITHROMYCIN 250 MG PO TABS
ORAL_TABLET | ORAL | 0 refills | Status: DC
  Filled 2022-12-20: qty 6, 5d supply, fill #0

## 2022-12-20 NOTE — Patient Instructions (Signed)
  Rest, fluids , tylenol  For cough:  Take Robitussin-DM OTC.  Follow the instructions in the box. Also you can take Tessalon Perles 3 times a day if needed   For nasal congestion: -Use over-the-counter Flonase: 2 nasal sprays on each side of the nose in the morning until you feel better    Take the antibiotic as prescribed (Zithromax)  Prednisone for few days   Call if not gradually better over the next  10 days   Call anytime if the symptoms are severe, you have high fever, short of breath, chest pain

## 2022-12-20 NOTE — Progress Notes (Signed)
Subjective:    Patient ID: Terri Johnson, female    DOB: [birth year], 57 y.o.   MRN: [id number]  DOS:  12/20/2022 Type of visit - description: Acute, here with her mother  Symptoms started 3 days ago: Sinus congestion, fever up to 101 (last night) Significant cough with chest congestion but unable to bring up sputum. Had diarrhea x 1 but no nausea vomiting.  Appetite is decreased. Denies major problems with achiness but she feels tired. + Wheezing and some chest pain with cough. Has taken Tylenol and Mucinex.   Review of Systems See above   Past Medical History:  Diagnosis Date   Allergy    Asthma    B12 deficiency 05/14/2015   Depression    GERD (gastroesophageal reflux disease)    History of chicken pox    Migraines    OSA (obstructive sleep apnea) 03/2016    Past Surgical History:  Procedure Laterality Date   CESAREAN SECTION  1991   CHOLECYSTECTOMY  2007   eardrum repair - left Left    INNER EAR SURGERY     Pt states she had her "eardrum rebuilt". Has had 8 sets of tubes in ears through [employer]   MYRINGOTOMY Bilateral    twice each.   [place]   TUBAL LIGATION  2001    Current Outpatient Medications  Medication Instructions   albuterol (VENTOLIN HFA) 108 (90 Base) MCG/ACT inhaler INHALE 2 PUFFS INTO THE LUNGS EVERY 6 HOURS AS NEEDED FOR WHEEZING OR SHORTNESS OF BREATH.   amoxicillin-clavulanate (AUGMENTIN) 875-125 MG tablet 1 tablet, Oral, 2 times daily   benzonatate (TESSALON) 100 mg, Oral, 3 times daily PRN   betamethasone valerate ointment (VALISONE) 0.1 % Apply small amount to ear as needed for itching/scaling.    buPROPion (WELLBUTRIN XL) 300 mg, Oral, Daily   cetirizine (ZYRTEC) 10 mg, Oral, Daily   Cholecalciferol (VITAMIN D3) 125 MCG (5000 UT) CHEW Oral   cyanocobalamin (DODEX) 1000 MCG/ML injection Inject 1 mL every 30 days.   cyclobenzaprine (FLEXERIL) 5 mg, Oral, Daily at bedtime   cycloSPORINE (RESTASIS) 0.05 %  ophthalmic emulsion Instill 1 drop into both eyes twice a day   escitalopram (LEXAPRO) 20 mg, Oral, Daily   fluticasone (FLONASE) 50 MCG/ACT nasal spray 2 sprays, Each Nare, Daily   hydrOXYzine (ATARAX) 10 mg, Oral, 3 times daily PRN   meloxicam (MOBIC) 15 mg, Oral, Daily   montelukast (SINGULAIR) 10 mg, Oral, Daily at bedtime   omeprazole (PRILOSEC) 40 mg, Oral, Daily   SYRINGE-NEEDLE, DISP, 3 ML (B-D 3CC LUER-LOK SYR 25GX1") 25G X 1" 3 ML MISC USE AS DIRECTED WITH VIT B12 INJECTION       Objective:   Physical Exam BP 130/76   Pulse (!) 110   Temp 98.2 F (36.8 C) (Oral)   Resp 18   Ht '4\' 11"'$  (1.499 m)   Wt 173 lb 6 oz (78.6 kg)   LMP 03/23/2014   SpO2 96%   BMI 35.02 kg/m  General:   Well developed, NAD, BMI noted.  Frequent cough noted HEENT:  Normocephalic . Face symmetric, atraumatic. Unable to see TMs due to wax.  Nose slightly congested.  Throat symmetric not red Lungs:  + Rhonchi bilaterally, increased expiratory time.  Mild large airway congestion. Normal respiratory effort, no intercostal retractions, no accessory muscle use. Heart: RRR,  no murmur.  Lower extremities: no pretibial edema bilaterally  Skin: Not pale. Not jaundice Neurologic:  alert & oriented X3.  Speech normal, gait appropriate for age and unassisted Psych--  Cognition and judgment appear intact.  Cooperative with normal attention span and concentration.  Behavior appropriate. No anxious or depressed appearing.      Assessment      57 year old female, PMH includes depression, OSA, asthma, allergies, B12 deficiency, presents with  Viral syndrome, asthma exacerbation Symptoms started 2 days ago, had a negative COVID test at home. COVID test here: Negative Flu tests here: Negative Plan: Treat asthma exacerbation with prednisone, Zithromax, Robitussin, Tessalon Perles.  Intolerant to hydrocodone or codeine Good hydration, Tylenol, call if not better. Also recommend to recheck for COVID  tomorrow just in case.

## 2022-12-21 ENCOUNTER — Ambulatory Visit: Payer: Commercial Managed Care - PPO | Admitting: Sports Medicine

## 2022-12-30 ENCOUNTER — Other Ambulatory Visit (HOSPITAL_BASED_OUTPATIENT_CLINIC_OR_DEPARTMENT_OTHER): Payer: Self-pay

## 2022-12-30 ENCOUNTER — Encounter: Payer: Self-pay | Admitting: Internal Medicine

## 2022-12-30 MED ORDER — PREDNISONE 10 MG PO TABS
ORAL_TABLET | ORAL | 0 refills | Status: AC
  Filled 2022-12-30: qty 18, 9d supply, fill #0

## 2023-01-05 NOTE — Progress Notes (Signed)
Terri Johnson D.Terri Johnson Phone: [phone]   Assessment and Plan:     1. Chronic right shoulder pain 2. Rotator cuff tendonitis, right -Chronic with exacerbation, subsequent visit - Reviewed patient's MRI which showed moderate infraspinatus tendinitis and mild supraspinatus tendinitis - Patient has had mild improvement in symptoms since subacromial CSI on 08/30/2022, AC joint CSI on 10/04/2022, however she continues to have pain with range of motion ever since her flu shot last year - Patient elected for repeat subacromial CSI.  Tolerated well per note below - May continue Flexeril 5 to 10 mg nightly as needed for muscle spasms - May restart meloxicam for 1 to 2 weeks, and then transition to as needed use  Procedure: Subacromial Injection Side: Right  Risks explained and consent was given verbally. The site was cleaned with alcohol prep. A steroid injection was performed from posterior approach using 84mL of 1% lidocaine without epinephrine and 36mL of kenalog 40mg /ml. This was well tolerated and resulted in symptomatic relief.  Needle was removed, hemostasis achieved, and post injection instructions were explained.   Pt was advised to call or return to clinic if these symptoms worsen or fail to improve as anticipated.     Pertinent previous records reviewed include shoulder MRI 12/08/2022   Follow Up: 3 to 4 weeks for reevaluation.  Could consider physical therapy   Subjective:   I, Terri Johnson, am serving as a Education administrator for Doctor Glennon Mac   Chief Complaint: Right shoulder pain    HPI:    08/30/22 Patient is a 57 year old female complaining of right shoulder pain. Patient states She received an influenza vaccine on 08/11/2022 and complains of right arm pain that radiates to the right side of her neck. When she initially received the injection, she reported of usual muscle aches however, symptoms  worsened from then. She states that when she injected the shot, it was more painful than usual. During the evening, she tosses between different sides. She has taken Ibuprofen, Aleve, and Tylenol but symptoms are still persistent.  she states she has a weird sensation down her arm it feels heavy and dead    10-12-2022 Patient states she has pain at night and up through her trap she has more ROM, but still has painful arcs    10/04/2022 Patient states she says she is good when taking the flexeril and voltaren but in pain when she doesn't take it    11/08/2022 Patient states she is hurting, CSI lasted 3 weeks   01/06/2023 Patient states she is the same   Relevant Historical Information: GERD,  Additional pertinent review of systems negative.   Current Outpatient Medications:    albuterol (VENTOLIN HFA) 108 (90 Base) MCG/ACT inhaler, Inhale 2 puffs into the lungs every 6 (six) hours as needed for wheezing or shortness of breath., Disp: 6.7 g, Rfl: 1   azithromycin (ZITHROMAX Z-PAK) 250 MG tablet, 2 tabs a day the first day, then 1 tab a day x 4 days, Disp: 6 tablet, Rfl: 0   benzonatate (TESSALON) 200 MG capsule, Take 1 capsule (200 mg total) by mouth 3 (three) times daily as needed for cough., Disp: 20 capsule, Rfl: 0   betamethasone valerate ointment (VALISONE) 0.1 %, Apply small amount to ear as needed for itching/scaling., Disp: , Rfl:    buPROPion (WELLBUTRIN XL) 300 MG 24 hr tablet, Take 1 tablet (300 mg total) by mouth daily., Disp:  90 tablet, Rfl: 1   cetirizine (ZYRTEC) 10 MG tablet, Take 10 mg by mouth daily., Disp: , Rfl:    Cholecalciferol (VITAMIN D3) 125 MCG (5000 UT) CHEW, Chew by mouth., Disp: , Rfl:    cyanocobalamin (DODEX) 1000 MCG/ML injection, Inject 1 mL every 30 days., Disp: 12 mL, Rfl: 4   cyclobenzaprine (FLEXERIL) 5 MG tablet, Take 1 tablet (5 mg total) by mouth at bedtime., Disp: 30 tablet, Rfl: 0   cycloSPORINE (RESTASIS) 0.05 % ophthalmic emulsion, Instill 1 drop  into both eyes twice a day, Disp: 180 each, Rfl: 3   escitalopram (LEXAPRO) 20 MG tablet, Take 1 tablet (20 mg total) by mouth daily., Disp: 90 tablet, Rfl: 0   fluticasone (FLONASE) 50 MCG/ACT nasal spray, Place 2 sprays into both nostrils daily., Disp: 16 g, Rfl: 6   hydrOXYzine (ATARAX) 10 MG tablet, Take 1 tablet (10 mg total) by mouth 3 (three) times daily as needed for itching., Disp: 21 tablet, Rfl: 0   montelukast (SINGULAIR) 10 MG tablet, Take 1 tablet (10 mg total) by mouth at bedtime., Disp: 90 tablet, Rfl: 1   omeprazole (PRILOSEC) 40 MG capsule, Take 1 capsule (40 mg total) by mouth daily., Disp: 90 capsule, Rfl: 1   predniSONE (DELTASONE) 10 MG tablet, Take 3 tablets (30 mg total) by mouth daily for 3 days, THEN 2 tablets (20 mg total) daily for 3 days, THEN 1 tablet (10 mg total) daily for 3 days., Disp: 18 tablet, Rfl: 0   SYRINGE-NEEDLE, DISP, 3 ML (B-D 3CC LUER-LOK SYR 25GX1") 25G X 1" 3 ML MISC, USE AS DIRECTED WITH VIT B12 INJECTION, Disp: 12 each, Rfl: 0   meloxicam (MOBIC) 15 MG tablet, Take 1 tablet (15 mg total) by mouth daily., Disp: 30 tablet, Rfl: 0   Objective:     Vitals:   01/06/23 1553  Pulse: (!) 117  SpO2: 97%  Weight: 173 lb (78.5 kg)  Height: 4\' 11"  (1.499 m)      Body mass index is 34.94 kg/m.    Physical Exam:    Gen: Appears well, nad, nontoxic and pleasant Neuro:sensation intact, strength is 5/5 with df/pf/inv/ev, muscle tone wnl Skin: no suspicious lesion or defmority Psych: A&O, appropriate mood and affect   Right shoulder: no deformity, swelling or muscle wasting No scapular winging FF 100, abd 90, int 20, ext 70 TTP AC, humerus, deltoid, trapezius NTTP over the [place], clavicle,   coracoid,   cervical spine Positive crossarm, Neer, Hawking's, O'Brien Negative empty can, speed, subscap liftoff Neg ant drawer, sulcus sign, apprehension Negative Spurling's test bilat FROM of neck     Electronically signed by:  Terri Johnson D.[facility] 8:11 AM 01/07/23

## 2023-01-06 ENCOUNTER — Ambulatory Visit: Payer: Commercial Managed Care - PPO | Admitting: Sports Medicine

## 2023-01-06 ENCOUNTER — Other Ambulatory Visit: Payer: Self-pay

## 2023-01-06 ENCOUNTER — Other Ambulatory Visit (HOSPITAL_COMMUNITY): Payer: Self-pay

## 2023-01-06 VITALS — HR 117 | Ht 59.0 in | Wt 173.0 lb

## 2023-01-06 DIAGNOSIS — T50905A Adverse effect of unspecified drugs, medicaments and biological substances, initial encounter: Secondary | ICD-10-CM

## 2023-01-06 DIAGNOSIS — M7581 Other shoulder lesions, right shoulder: Secondary | ICD-10-CM | POA: Diagnosis not present

## 2023-01-06 DIAGNOSIS — G8929 Other chronic pain: Secondary | ICD-10-CM

## 2023-01-06 DIAGNOSIS — M25511 Pain in right shoulder: Secondary | ICD-10-CM

## 2023-01-06 MED ORDER — MELOXICAM 15 MG PO TABS
15.0000 mg | ORAL_TABLET | Freq: Every day | ORAL | 0 refills | Status: DC
  Filled 2023-01-06: qty 30, 30d supply, fill #0

## 2023-01-06 MED ORDER — CYCLOBENZAPRINE HCL 5 MG PO TABS
5.0000 mg | ORAL_TABLET | Freq: Every day | ORAL | 0 refills | Status: DC
Start: 2023-01-06 — End: 2023-09-29
  Filled 2023-01-06: qty 30, 30d supply, fill #0

## 2023-01-06 NOTE — Patient Instructions (Addendum)
Good to see you Flexeril 5-10 mg nightly as needed for muscle spasm  Refilled meloxicam  Continue HEP  3-4 week follow up

## 2023-01-07 ENCOUNTER — Other Ambulatory Visit (HOSPITAL_COMMUNITY): Payer: Self-pay

## 2023-01-07 ENCOUNTER — Other Ambulatory Visit: Payer: Self-pay

## 2023-02-02 NOTE — Progress Notes (Unsigned)
Terri Johnson D.[facility] [address] [place] [address] Phone: [phone]   Assessment and Plan:     There are no diagnoses linked to this encounter.  ***   Pertinent previous records reviewed include ***   Follow Up: ***     Subjective:   I, Terri Johnson, am serving as a Neurosurgeon for Doctor Richardean Sale   Chief Complaint: Right shoulder pain    HPI:    08/30/22 Patient is a 57 year old female complaining of right shoulder pain. Patient states She received an influenza vaccine on 08/11/2022 and complains of right arm pain that radiates to the right side of her neck. When she initially received the injection, she reported of usual muscle aches however, symptoms worsened from then. She states that when she injected the shot, it was more painful than usual. During the evening, she tosses between different sides. She has taken Ibuprofen, Aleve, and Tylenol but symptoms are still persistent.  she states she has a weird sensation down her arm it feels heavy and dead    10-18-2022 Patient states she has pain at night and up through her trap she has more ROM, but still has painful arcs    10/04/2022 Patient states she says she is good when taking the flexeril and voltaren but in pain when she doesn't take it    11/08/2022 Patient states she is hurting, CSI lasted 3 weeks   01/06/2023 Patient states she is the same   02/03/2023 Patient states    Relevant Historical Information: GERD,  Additional pertinent review of systems negative.   Current Outpatient Medications:    albuterol (VENTOLIN HFA) 108 (90 Base) MCG/ACT inhaler, Inhale 2 puffs into the lungs every 6 (six) hours as needed for wheezing or shortness of breath., Disp: 6.7 g, Rfl: 1   azithromycin (ZITHROMAX Z-PAK) 250 MG tablet, 2 tabs a day the first day, then 1 tab a day x 4 days, Disp: 6 tablet, Rfl: 0   benzonatate (TESSALON) 200 MG capsule, Take 1 capsule (200 mg  total) by mouth 3 (three) times daily as needed for cough., Disp: 20 capsule, Rfl: 0   betamethasone valerate ointment (VALISONE) 0.1 %, Apply small amount to ear as needed for itching/scaling., Disp: , Rfl:    buPROPion (WELLBUTRIN XL) 300 MG 24 hr tablet, Take 1 tablet (300 mg total) by mouth daily., Disp: 90 tablet, Rfl: 1   cetirizine (ZYRTEC) 10 MG tablet, Take 10 mg by mouth daily., Disp: , Rfl:    Cholecalciferol (VITAMIN D3) 125 MCG (5000 UT) CHEW, Chew by mouth., Disp: , Rfl:    cyanocobalamin (DODEX) 1000 MCG/ML injection, Inject 1 mL every 30 days., Disp: 12 mL, Rfl: 4   cyclobenzaprine (FLEXERIL) 5 MG tablet, Take 1 tablet (5 mg total) by mouth at bedtime., Disp: 30 tablet, Rfl: 0   cycloSPORINE (RESTASIS) 0.05 % ophthalmic emulsion, Instill 1 drop into both eyes twice a day, Disp: 180 each, Rfl: 3   escitalopram (LEXAPRO) 20 MG tablet, Take 1 tablet (20 mg total) by mouth daily., Disp: 90 tablet, Rfl: 0   fluticasone (FLONASE) 50 MCG/ACT nasal spray, Place 2 sprays into both nostrils daily., Disp: 16 g, Rfl: 6   hydrOXYzine (ATARAX) 10 MG tablet, Take 1 tablet (10 mg total) by mouth 3 (three) times daily as needed for itching., Disp: 21 tablet, Rfl: 0   meloxicam (MOBIC) 15 MG tablet, Take 1 tablet (15 mg total) by mouth daily.,  Disp: 30 tablet, Rfl: 0   montelukast (SINGULAIR) 10 MG tablet, Take 1 tablet (10 mg total) by mouth at bedtime., Disp: 90 tablet, Rfl: 1   omeprazole (PRILOSEC) 40 MG capsule, Take 1 capsule (40 mg total) by mouth daily., Disp: 90 capsule, Rfl: 1   SYRINGE-NEEDLE, DISP, 3 ML (B-D 3CC LUER-LOK SYR 25GX1") 25G X 1" 3 ML MISC, USE AS DIRECTED WITH VIT B12 INJECTION, Disp: 12 each, Rfl: 0   Objective:     There were no vitals filed for this visit.    There is no height or weight on file to calculate BMI.    Physical Exam:    ***   Electronically signed by:  Terri Johnson D.[facility] 7:31 AM 02/02/23

## 2023-02-03 ENCOUNTER — Other Ambulatory Visit (HOSPITAL_COMMUNITY): Payer: Self-pay

## 2023-02-03 ENCOUNTER — Ambulatory Visit: Payer: Commercial Managed Care - PPO | Admitting: Sports Medicine

## 2023-02-03 VITALS — BP 118/80 | HR 116 | Ht 59.0 in | Wt 182.0 lb

## 2023-02-03 DIAGNOSIS — M7581 Other shoulder lesions, right shoulder: Secondary | ICD-10-CM

## 2023-02-03 DIAGNOSIS — G8929 Other chronic pain: Secondary | ICD-10-CM

## 2023-02-03 DIAGNOSIS — M25511 Pain in right shoulder: Secondary | ICD-10-CM | POA: Diagnosis not present

## 2023-02-03 NOTE — Patient Instructions (Signed)
Good to see you   

## 2023-02-17 DIAGNOSIS — H6123 Impacted cerumen, bilateral: Secondary | ICD-10-CM | POA: Diagnosis not present

## 2023-02-18 ENCOUNTER — Other Ambulatory Visit (HOSPITAL_COMMUNITY): Payer: Self-pay

## 2023-02-18 MED ORDER — BETAMETHASONE VALERATE 0.1 % EX OINT
1.0000 | TOPICAL_OINTMENT | CUTANEOUS | 1 refills | Status: AC | PRN
  Filled 2023-02-18: qty 30, 30d supply, fill #0

## 2023-03-24 ENCOUNTER — Encounter: Payer: Self-pay | Admitting: Sports Medicine

## 2023-03-24 ENCOUNTER — Other Ambulatory Visit: Payer: Self-pay | Admitting: Sports Medicine

## 2023-03-24 DIAGNOSIS — G8929 Other chronic pain: Secondary | ICD-10-CM

## 2023-03-24 DIAGNOSIS — M7581 Other shoulder lesions, right shoulder: Secondary | ICD-10-CM

## 2023-03-24 DIAGNOSIS — M25511 Pain in right shoulder: Secondary | ICD-10-CM

## 2023-03-24 NOTE — Progress Notes (Unsigned)
Pt referral

## 2023-03-30 DIAGNOSIS — H524 Presbyopia: Secondary | ICD-10-CM | POA: Diagnosis not present

## 2023-03-30 DIAGNOSIS — H52223 Regular astigmatism, bilateral: Secondary | ICD-10-CM | POA: Diagnosis not present

## 2023-03-30 DIAGNOSIS — H16223 Keratoconjunctivitis sicca, not specified as Sjogren's, bilateral: Secondary | ICD-10-CM | POA: Diagnosis not present

## 2023-03-30 DIAGNOSIS — H02889 Meibomian gland dysfunction of unspecified eye, unspecified eyelid: Secondary | ICD-10-CM | POA: Diagnosis not present

## 2023-04-25 ENCOUNTER — Other Ambulatory Visit: Payer: Self-pay | Admitting: Family

## 2023-04-25 ENCOUNTER — Ambulatory Visit: Payer: Commercial Managed Care - PPO | Attending: Sports Medicine | Admitting: Physical Therapy

## 2023-04-25 DIAGNOSIS — M25611 Stiffness of right shoulder, not elsewhere classified: Secondary | ICD-10-CM | POA: Insufficient documentation

## 2023-04-25 DIAGNOSIS — M7581 Other shoulder lesions, right shoulder: Secondary | ICD-10-CM | POA: Diagnosis not present

## 2023-04-25 DIAGNOSIS — G8929 Other chronic pain: Secondary | ICD-10-CM | POA: Diagnosis not present

## 2023-04-25 DIAGNOSIS — M6281 Muscle weakness (generalized): Secondary | ICD-10-CM | POA: Insufficient documentation

## 2023-04-25 DIAGNOSIS — M25511 Pain in right shoulder: Secondary | ICD-10-CM | POA: Insufficient documentation

## 2023-04-25 NOTE — Therapy (Signed)
OUTPATIENT PHYSICAL THERAPY SHOULDER EVALUATION   Patient Name: Terri Johnson MRN: [id number] DOB:January 07, 1966, 57 y.o., female Today's Date: 04/25/2023  END OF SESSION:  PT End of Session - 04/25/23 1624     Visit Number 1    Date for PT Re-Evaluation 06/06/23    Authorization Type [employer] VL: [id number]    PT Start Time 1620    PT Stop Time 1710    PT Time Calculation (min) 50 min    Activity Tolerance Patient limited by pain    Behavior During Therapy [facility] for tasks assessed/performed             Past Medical History:  Diagnosis Date   Allergy    Asthma    B12 deficiency 05/14/2015   Depression    GERD (gastroesophageal reflux disease)    History of chicken pox    Migraines    OSA (obstructive sleep apnea) 03/2016   Past Surgical History:  Procedure Laterality Date   CESAREAN SECTION  1991   CHOLECYSTECTOMY  2007   eardrum repair - left Left    INNER EAR SURGERY     Pt states she had her "eardrum rebuilt". Has had 8 sets of tubes in ears through [employer]   MYRINGOTOMY Bilateral    twice each.   TONSILLECTOMY AND ADENOIDECTOMY  1976   TUBAL LIGATION  2001   Patient Active Problem List   Diagnosis Date Noted   Acute maxillary sinusitis 09/24/2022   Elevated blood pressure reading 09/24/2022   Acute pain of right shoulder 08/16/2022   Reaction to shot 08/16/2022   Seasonal allergies 02/24/2021   Low back pain radiating to right leg 03/19/2018   Overactive bladder 05/06/2016   OSA (obstructive sleep apnea) 03/24/2016   Migraines 03/23/2016   Vitamin D deficiency 08/08/2015   Vitamin B12 deficiency anemia 07/04/2015   B12 deficiency 05/14/2015   Hot flashes 05/11/2015   Preventative health care 05/11/2015   Asthma 03/24/2015   GERD (gastroesophageal reflux disease) 03/24/2015   Anxiety and depression 03/24/2015   Headache 03/24/2015    PCP: Sandford Craze, NP   REFERRING PROVIDER: Richardean Sale, DO   REFERRING DIAG: [phone] (ICD-10-CM) -  Chronic right shoulder pain M75.81 (ICD-10-CM) - Rotator cuff tendonitis, right M25.511 (ICD-10-CM) - Acute pain of right shoulder  THERAPY DIAG:  Acute pain of right shoulder  Stiffness of right shoulder, not elsewhere classified  Muscle weakness (generalized)  Rationale for Evaluation and Treatment: Rehabilitation  ONSET DATE: 08/12/2023  SUBJECTIVE:  SUBJECTIVE STATEMENT: HADY MCFALLS had a flu shot on 08/12/23, she did not have shoulder pain before the shot, that night the pain went up her neck and got bad, has hurt since, she has gotten several steroid injections from sports medicine doctor have helped but the pain comes back, her arm feels heavy and the 4th and 5th fingers feel numb.  She can't reach behind her back, or reach across, tries not to lift her arm now.  Interferes with work duties, dressing, brushing hair, she also drops things a lot now.  She has some good days without pain, but she has a lot of bad days, worried she will lose feeling in her hand.   Hand dominance: Right  PERTINENT HISTORY: Migraines, OSA, asthma, GERD, B12 deficiency  PAIN:  Are you having pain? Yes: NPRS scale: 7/10 Pain location: R shoulder Pain description: achey, numb, sore to touch, radiates down to 4th, 5th fingers Aggravating factors: reaching behind, across, up; sleeping on R side Relieving factors: keeping it close to my body, meloxicam, tylenol, steroid injections, rest.   PRECAUTIONS: None  WEIGHT BEARING RESTRICTIONS: No  FALLS:  Has patient fallen in last 6 months? No  LIVING ENVIRONMENT: Lives with: lives with their spouse Lives in: House/apartment Stairs: Yes: Internal: 14 steps; on left going up Has following equipment at home: None  OCCUPATION: Works at [employer]  PLOF:  Independent  PATIENT GOALS:I don't want to get stiff and hope this pain goes away, I don't want surgery  NEXT MD VISIT: not scheduled  OBJECTIVE:   DIAGNOSTIC FINDINGS:  12/08/2022 MR R shoulder IMPRESSION: 1. Mild tendinosis of the supraspinatus tendon. 2. Moderate tendinosis of the infraspinatus tendon.  PATIENT SURVEYS:  Quick Dash 77.3%  COGNITION: Overall cognitive status: Within functional limits for tasks assessed     SENSATION: WFL  POSTURE: Forward head, rounded shoulders   UPPER EXTREMITY ROM:   PROM checked in supine, very guarded and painful, = to AROM.   Active ROM Right* eval Left eval  Shoulder flexion 98p! 140  Shoulder extension 32p! 60  Shoulder abduction 80p! 150  Shoulder adduction    Shoulder internal rotation (functional reach) F: To side 40 T10  Shoulder external rotation Functional 40 F:unable 75 F: T2  (Blank rows = not tested)  UPPER EXTREMITY MMT:  MMT Right eval Left eval  Shoulder flexion 2 5  Shoulder extension    Shoulder abduction 2 5  Shoulder adduction    Shoulder internal rotation 2 5  Shoulder external rotation 2 5  Middle trapezius    Lower trapezius    Elbow flexion    Elbow extension    Wrist flexion    Wrist extension    Wrist ulnar deviation    Wrist radial deviation    Wrist pronation    Wrist supination    Grip strength (lbs) 0 35  Thumb pinch 5 8.5  (Blank rows = not tested)  SHOULDER SPECIAL TESTS: NT due to pain.   JOINT MOBILITY TESTING:  NT due to pain  PALPATION:  Noted hypomobility in cervical spine, tenderness and spasm throughout cervical paraspinals, R UT, R L/S, R scalenes, elevated 1st rib.    TODAY'S TREATMENT:  DATE:   04/25/2023 Modalities: Estim (premod) to R shoulder and R UT x 10 min with MHP.  Bell placed at side with instructions to ring if  assistance needed or discomfort.    PATIENT EDUCATION: Education details: findings, POC Person educated: Patient Education method: Explanation Education comprehension: verbalized understanding  HOME EXERCISE PROGRAM: TBD  ASSESSMENT:  CLINICAL IMPRESSION: Patient is a 57 y.o. right hand dominant female who was seen today for physical therapy evaluation and treatment for R shoulder pain starting 08/12/23 after flu shot, minimal improvement after series of injections, subsequent MRI demonstrating tendonosis of infraspinatus and supraspinatus tendons. She demonstrates severe muscles spasms throughout R cervical and shoulder musculature today, decreased R shoulder ROM, extremely painful and guarded so at this time PROM = AROM.  R arm strength also limited due to pain and reports paraesthesias along ulnar nerve distribution in 4th and 5th fingers.  Grip strength in R hand significantly impaired.  Noted extremely tearful after even very gentle examination today.  Applied Estim and MHP, instructed to ring bell if needed, however did not and reported discomfort in one electrode placed over shoulder.  Discussed scheduling follow-up with MD and discussing another steroid injection since she reports it has been 8 months since she's had one, and currently her level of pain may make participation in PT challenging.  SHEVONDA RIDEAU would benefit from skilled physical therapy to decrease R shoulder pain, improve R shoulder ROM and improve quality of life and ability to perform ADLs and job activities.    OBJECTIVE IMPAIRMENTS: decreased ROM, decreased strength, hypomobility, increased fascial restrictions, impaired perceived functional ability, increased muscle spasms, impaired flexibility, impaired sensation, impaired UE functional use, postural dysfunction, and pain.   ACTIVITY LIMITATIONS: carrying, lifting, bending, sleeping, bathing, dressing, reach over head, and hygiene/grooming  PARTICIPATION  LIMITATIONS: meal prep, cleaning, laundry, driving, shopping, and occupation  PERSONAL FACTORS: Time since onset of injury/illness/exacerbation and 1-2 comorbidities: anxiety, migraines, R RTC tendinosis  are also affecting patient's functional outcome.   REHAB POTENTIAL: Good  CLINICAL DECISION MAKING: Evolving/moderate complexity  EVALUATION COMPLEXITY: Moderate   GOALS: Goals reviewed with patient? Yes  SHORT TERM GOALS: Target date: 05/09/2023   Patient will be independent with initial HEP.  Baseline: needs Goal status: INITIAL   LONG TERM GOALS: Target date: 06/06/2023   Patient will be independent with advanced/ongoing HEP to improve outcomes and carryover.  Baseline:  Goal status: INITIAL  2.  Patient will report 75% improvement in right shoulder pain to improve QOL.  Baseline:  Goal status: INITIAL  3.  Patient will report 75% improvement in R hand paresthesias. Baseline:  Goal status: INITIAL  4.  Patient to improve R shoulder AROM to [facility] without pain provocation to allow for increased ease of ADLs.  Baseline: see objective Goal status: INITIAL  5.  Patient will demonstrate improved functional UE strength as demonstrated by 5/5 R shoulder strength. Baseline: see objective Goal status: INITIAL  6.  Patient will report 10 points improvement on QuickDash to demonstrate improved functional ability.  Baseline: 77.3% impairment Goal status: INITIAL  7.  Patient will demonstrate 30lb R grip strength   Baseline: 0lbs, keeps dropping things Goal status: INITIAL    PLAN:  PT FREQUENCY: 1-2x/week  PT DURATION: 6 weeks  PLANNED INTERVENTIONS: Therapeutic exercises, Therapeutic activity, Neuromuscular re-education, Balance training, Gait training, Patient/Family education, Self Care, Joint mobilization, Dry Needling, Electrical stimulation, Spinal manipulation, Spinal mobilization, Cryotherapy, Moist heat, Taping, Ultrasound, Ionotophoresis 4mg /ml Dexamethasone,  Manual therapy, and  Re-evaluation  PLAN FOR NEXT SESSION: start with isometrics and table slides, manual therapy and modalities for pain control    Jena Gauss, PT 04/25/2023, 6:15 PM

## 2023-04-26 ENCOUNTER — Other Ambulatory Visit: Payer: Self-pay

## 2023-04-26 ENCOUNTER — Encounter: Payer: Self-pay | Admitting: *Deleted

## 2023-04-26 ENCOUNTER — Other Ambulatory Visit (HOSPITAL_COMMUNITY): Payer: Self-pay

## 2023-04-26 MED ORDER — BUPROPION HCL ER (XL) 300 MG PO TB24
300.0000 mg | ORAL_TABLET | Freq: Every day | ORAL | 0 refills | Status: DC
  Filled 2023-04-26: qty 90, 90d supply, fill #0

## 2023-04-28 ENCOUNTER — Ambulatory Visit: Payer: Commercial Managed Care - PPO

## 2023-04-28 NOTE — Progress Notes (Signed)
[facility] [address] [place] [address] Phone: [phone]   Assessment and Plan:     1. Chronic right shoulder pain 2. Rotator cuff tendonitis, right  -Chronic with exacerbation, subsequent visit - Still consistent with moderate infraspinatus tendinitis and mild supraspinatus tendinitis as seen on prior MRI that has had recurrent flare limiting patient's ability to perform daily activities and limiting physical therapy - May continue to use Flexeril and meloxicam as needed for pain relief and muscle spasms - Recommend using Tylenol for day-to-day pain relief - Continue HEP and physical therapy - Patient elected for repeat subacromial CSI with large volume which previously was more beneficial for patient than low volume CSI.  Tolerated well per note below  Procedure: Subacromial Injection Side: Right  Risks explained and consent was given verbally. The site was cleaned with alcohol prep. A steroid injection was performed from posterior approach using 4 mL of 1% lidocaine without epinephrine and 2 mL of kenalog 40mg /ml. This was well tolerated and resulted in symptomatic relief.  Needle was removed, hemostasis achieved, and post injection instructions were explained.   Pt was advised to call or return to clinic if these symptoms worsen or fail to improve as anticipated.    Pertinent previous records reviewed include none   Follow Up: 4 weeks for reevaluation.  If no improvement, could consider PRP versus ECSWT   Subjective:   I, [name], am serving as a Neurosurgeon for Doctor Richardean Sale   Chief Complaint: Right shoulder pain    HPI:    08/30/22 Patient is a 57 year old female complaining of right shoulder pain. Patient states She received an influenza vaccine on 08/11/2022 and complains of right arm pain that radiates to the right side of her neck. When she initially received the injection, she reported of usual  muscle aches however, symptoms worsened from then. She states that when she injected the shot, it was more painful than usual. During the evening, she tosses between different sides. She has taken Ibuprofen, Aleve, and Tylenol but symptoms are still persistent.  she states she has a weird sensation down her arm it feels heavy and dead    Oct 06, 2022 Patient states she has pain at night and up through her trap she has more ROM, but still has painful arcs    10/04/2022 Patient states she says she is good when taking the flexeril and voltaren but in pain when she doesn't take it    11/08/2022 Patient states she is hurting, CSI lasted 3 weeks   01/06/2023 Patient states she is the same    02/03/2023 Patient states a little better , intermittent days of no pain and the days of decreased Rom with a dull pain   04/29/2023 Patient states she started PT and it was tough. Would like another round of meloxicam and flexeril     Relevant Historical Information: GERD,  Additional pertinent review of systems negative.   Current Outpatient Medications:    albuterol (VENTOLIN HFA) 108 (90 Base) MCG/ACT inhaler, Inhale 2 puffs into the lungs every 6 (six) hours as needed for wheezing or shortness of breath., Disp: 6.7 g, Rfl: 1   azithromycin (ZITHROMAX Z-PAK) 250 MG tablet, 2 tabs a day the first day, then 1 tab a day x 4 days, Disp: 6 tablet, Rfl: 0   benzonatate (TESSALON) 200 MG capsule, Take 1 capsule (200 mg total) by mouth 3 (three) times daily as needed for  cough., Disp: 20 capsule, Rfl: 0   betamethasone valerate ointment (VALISONE) 0.1 %, Apply small amount to ear as needed for itching/scaling., Disp: , Rfl:    betamethasone valerate ointment (VALISONE) 0.1 %, Apply to ears as needed for itching., Disp: 30 g, Rfl: 1   buPROPion (WELLBUTRIN XL) 300 MG 24 hr tablet, Take 1 tablet (300 mg total) by mouth daily., Disp: 90 tablet, Rfl: 0   cetirizine (ZYRTEC) 10 MG tablet, Take 10 mg by mouth daily.,  Disp: , Rfl:    Cholecalciferol (VITAMIN D3) 125 MCG (5000 UT) CHEW, Chew by mouth., Disp: , Rfl:    cyanocobalamin (DODEX) 1000 MCG/ML injection, Inject 1 mL every 30 days., Disp: 12 mL, Rfl: 4   cyclobenzaprine (FLEXERIL) 5 MG tablet, Take 1 tablet (5 mg total) by mouth at bedtime., Disp: 30 tablet, Rfl: 0   cycloSPORINE (RESTASIS) 0.05 % ophthalmic emulsion, Instill 1 drop into both eyes twice a day, Disp: 180 each, Rfl: 3   escitalopram (LEXAPRO) 20 MG tablet, Take 1 tablet (20 mg total) by mouth daily., Disp: 90 tablet, Rfl: 0   fluticasone (FLONASE) 50 MCG/ACT nasal spray, Place 2 sprays into both nostrils daily., Disp: 16 g, Rfl: 6   hydrOXYzine (ATARAX) 10 MG tablet, Take 1 tablet (10 mg total) by mouth 3 (three) times daily as needed for itching., Disp: 21 tablet, Rfl: 0   meloxicam (MOBIC) 15 MG tablet, Take 1 tablet (15 mg total) by mouth daily., Disp: 30 tablet, Rfl: 0   montelukast (SINGULAIR) 10 MG tablet, Take 1 tablet (10 mg total) by mouth at bedtime., Disp: 90 tablet, Rfl: 1   omeprazole (PRILOSEC) 40 MG capsule, Take 1 capsule (40 mg total) by mouth daily., Disp: 90 capsule, Rfl: 1   SYRINGE-NEEDLE, DISP, 3 ML (B-D 3CC LUER-LOK SYR 25GX1") 25G X 1" 3 ML MISC, USE AS DIRECTED WITH VIT B12 INJECTION, Disp: 12 each, Rfl: 0   Objective:     Vitals:   04/29/23 1042  Pulse: 88  SpO2: 97%  Weight: 182 lb (82.6 kg)  Height: 4\' 11"  (1.499 m)      Body mass index is 36.76 kg/m.    Physical Exam:    Gen: Appears well, nad, nontoxic and pleasant Neuro:sensation intact, strength is 5/5 with df/pf/inv/ev, muscle tone wnl Skin: no suspicious lesion or defmority Psych: A&O, appropriate mood and affect   Right shoulder: no deformity, swelling or muscle wasting No scapular winging FF 100, abd 90, int 20, ext 70 TTP AC, humerus, deltoid, trapezius NTTP over the [place], clavicle,   coracoid,   cervical spine Positive crossarm, Neer, Hawking's, O'Brien Negative empty can, speed,  subscap liftoff Neg ant drawer, sulcus sign, apprehension Negative Spurling's test bilat FROM of neck       Electronically signed by:  [facility] 10:52 AM 04/29/23

## 2023-04-29 ENCOUNTER — Ambulatory Visit: Payer: Commercial Managed Care - PPO | Admitting: Sports Medicine

## 2023-04-29 VITALS — HR 88 | Ht 59.0 in | Wt 182.0 lb

## 2023-04-29 DIAGNOSIS — M7581 Other shoulder lesions, right shoulder: Secondary | ICD-10-CM | POA: Diagnosis not present

## 2023-04-29 DIAGNOSIS — G8929 Other chronic pain: Secondary | ICD-10-CM

## 2023-04-29 DIAGNOSIS — M25511 Pain in right shoulder: Secondary | ICD-10-CM

## 2023-05-02 ENCOUNTER — Ambulatory Visit: Payer: Commercial Managed Care - PPO | Admitting: Physical Therapy

## 2023-05-02 ENCOUNTER — Encounter: Payer: Self-pay | Admitting: Physical Therapy

## 2023-05-02 DIAGNOSIS — M7581 Other shoulder lesions, right shoulder: Secondary | ICD-10-CM | POA: Diagnosis not present

## 2023-05-02 DIAGNOSIS — M25611 Stiffness of right shoulder, not elsewhere classified: Secondary | ICD-10-CM | POA: Diagnosis not present

## 2023-05-02 DIAGNOSIS — M6281 Muscle weakness (generalized): Secondary | ICD-10-CM

## 2023-05-02 DIAGNOSIS — G8929 Other chronic pain: Secondary | ICD-10-CM | POA: Diagnosis not present

## 2023-05-02 DIAGNOSIS — M25511 Pain in right shoulder: Secondary | ICD-10-CM | POA: Diagnosis not present

## 2023-05-02 NOTE — Therapy (Signed)
OUTPATIENT PHYSICAL THERAPY TREATMENT   Patient Name: Terri Johnson MRN: [id number] DOB:1965-11-18, 57 y.o., female Today's Date: 05/02/2023  END OF SESSION:  PT End of Session - 05/02/23 1619     Visit Number 2    Date for PT Re-Evaluation 06/06/23    Authorization Type [employer] VL: [id number]    PT Start Time 1619    PT Stop Time 1710    PT Time Calculation (min) 51 min    Activity Tolerance Patient limited by pain    Behavior During Therapy [facility] for tasks assessed/performed             Past Medical History:  Diagnosis Date   Allergy    Asthma    B12 deficiency 05/14/2015   Depression    GERD (gastroesophageal reflux disease)    History of chicken pox    Migraines    OSA (obstructive sleep apnea) 03/2016   Past Surgical History:  Procedure Laterality Date   CESAREAN SECTION  1991   CHOLECYSTECTOMY  2007   eardrum repair - left Left    INNER EAR SURGERY     Pt states she had her "eardrum rebuilt". Has had 8 sets of tubes in ears through [employer]   MYRINGOTOMY Bilateral    twice each.   TONSILLECTOMY AND ADENOIDECTOMY  1976   TUBAL LIGATION  2001   Patient Active Problem List   Diagnosis Date Noted   Acute maxillary sinusitis 09/24/2022   Elevated blood pressure reading 09/24/2022   Acute pain of right shoulder 08/16/2022   Reaction to shot 08/16/2022   Seasonal allergies 02/24/2021   Low back pain radiating to right leg 03/19/2018   Overactive bladder 05/06/2016   OSA (obstructive sleep apnea) 03/24/2016   Migraines 03/23/2016   Vitamin D deficiency 08/08/2015   Vitamin B12 deficiency anemia 07/04/2015   B12 deficiency 05/14/2015   Hot flashes 05/11/2015   Preventative health care 05/11/2015   Asthma 03/24/2015   GERD (gastroesophageal reflux disease) 03/24/2015   Anxiety and depression 03/24/2015   Headache 03/24/2015    PCP: Sandford Craze, NP   REFERRING PROVIDER: Richardean Sale, DO   REFERRING DIAG: [id number] (ICD-10-CM) - Chronic  right shoulder pain M75.81 (ICD-10-CM) - Rotator cuff tendonitis, right M25.511 (ICD-10-CM) - Acute pain of right shoulder  THERAPY DIAG:  Acute pain of right shoulder  Stiffness of right shoulder, not elsewhere classified  Muscle weakness (generalized)  Rationale for Evaluation and Treatment: Rehabilitation  ONSET DATE: 08/12/2023  SUBJECTIVE:  SUBJECTIVE STATEMENT: Terri Johnson got a shot on Friday which helped quite a bit, her neck feels better too and not having as much tingling in her hand.  She did sleep funny last night so still painful.     Hand dominance: Right  PERTINENT HISTORY: Migraines, OSA, asthma, GERD, B12 deficiency  PAIN:  Are you having pain? Yes: NPRS scale: 7/10 Pain location: R shoulder Pain description: achey, numb, sore to touch, radiates down to 4th, 5th fingers Aggravating factors: reaching behind, across, up; sleeping on R side Relieving factors: keeping it close to my body, meloxicam, tylenol, steroid injections, rest.   PRECAUTIONS: None  WEIGHT BEARING RESTRICTIONS: No  FALLS:  Has patient fallen in last 6 months? No  LIVING ENVIRONMENT: Lives with: lives with their spouse Lives in: House/apartment Stairs: Yes: Internal: 14 steps; on left going up Has following equipment at home: None  OCCUPATION: Works at [employer]  PLOF: Independent  PATIENT GOALS:I don't want to get stiff and hope this pain goes away, I don't want surgery  NEXT MD VISIT: not scheduled  OBJECTIVE:   DIAGNOSTIC FINDINGS:  12/08/2022 MR R shoulder IMPRESSION: 1. Mild tendinosis of the supraspinatus tendon. 2. Moderate tendinosis of the infraspinatus tendon.  PATIENT SURVEYS:  Quick Dash 77.3%  COGNITION: Overall cognitive status: Within functional limits for tasks  assessed     SENSATION: WFL  POSTURE: Forward head, rounded shoulders   UPPER EXTREMITY ROM:   PROM checked in supine, very guarded and painful, = to AROM.   Active ROM Right* eval Left eval  Shoulder flexion 98p! 140  Shoulder extension 32p! 60  Shoulder abduction 80p! 150  Shoulder adduction    Shoulder internal rotation (functional reach) F: To side 40 T10  Shoulder external rotation Functional 40 F:unable 75 F: T2  (Blank rows = not tested)  UPPER EXTREMITY MMT:  MMT Right eval Left eval  Shoulder flexion 2 5  Shoulder extension    Shoulder abduction 2 5  Shoulder adduction    Shoulder internal rotation 2 5  Shoulder external rotation 2 5  Middle trapezius    Lower trapezius    Elbow flexion    Elbow extension    Wrist flexion    Wrist extension    Grip strength (lbs) 0 35  Thumb pinch 5 8.5  (Blank rows = not tested)  SHOULDER SPECIAL TESTS: NT due to pain.   JOINT MOBILITY TESTING:  NT due to pain  PALPATION:  Noted hypomobility in cervical spine, tenderness and spasm throughout cervical paraspinals, R UT, R L/S, R scalenes, elevated 1st rib.    TODAY'S TREATMENT:                                                                                                                                         DATE:   05/02/23 Therapeutic Exercise: to improve strength and mobility.  Demo, verbal and tactile cues throughout for technique. Pulleys flexion x 2 min AAROM for R shoulder Table slides scaption R shoulder x 20 R shoulder isometrics submax contracton - IR 5 x 5 sec hold, ER 5 x 5 sec hold, Extension 5 x 5 sec hold, flexion 5 x 5 sec hold Manual Therapy: to decrease muscle spasm and pain and improve mobility STM/TPR to R UT, cervical paraspinals, levator scapule Modalities: Estim (premod) to R shoulder and R UT x 10 min with MHP.  Bell placed at side with instructions to ring if assistance needed or discomfort.   04/25/2023 Modalities: Estim (premod) to  R shoulder and R UT x 10 min with MHP.  Bell placed at side with instructions to ring if assistance needed or discomfort.    PATIENT EDUCATION: Education details: HEP Person educated: Patient Education method: Explanation Education comprehension: verbalized understanding  HOME EXERCISE PROGRAM: Access Code: [email or web address] URL: [email or web address] Date: 05/02/2023 Prepared by: Harrie Foreman  Exercises - Seated Shoulder Flexion Towel Slide at Table Top  - 1 x daily - 7 x weekly - 3 sets - 10 reps - Isometric Shoulder Internal Rotation  - 3 x daily - 7 x weekly - 1 sets - 5-10 reps - 5 sec hold - Isometric Shoulder External Rotation  - 3 x daily - 7 x weekly - 1 sets - 5-10 reps - 5 sec  hold - Isometric Shoulder Flexion  - 3 x daily - 7 x weekly - 1 sets - 5-10 reps - 5 sec hold - Seated Isometric Shoulder Extension  - 3 x daily - 7 x weekly - 1 sets - 5-10 reps - 5 sec  hold  ASSESSMENT:  CLINICAL IMPRESSION: Layia was able to tolerate much more R shoulder movement today, although still focused on R shoulder isometrics and AAROM in pain free ROM today, followed by modalities.  Noted still significant spasm in R UT and cervical paraspinals.  NYAJAH HYSON continues to demonstrate potential for improvement and would benefit from continued skilled therapy to address impairments.        OBJECTIVE IMPAIRMENTS: decreased ROM, decreased strength, hypomobility, increased fascial restrictions, impaired perceived functional ability, increased muscle spasms, impaired flexibility, impaired sensation, impaired UE functional use, postural dysfunction, and pain.   ACTIVITY LIMITATIONS: carrying, lifting, bending, sleeping, bathing, dressing, reach over head, and hygiene/grooming  PARTICIPATION LIMITATIONS: meal prep, cleaning, laundry, driving, shopping, and occupation  PERSONAL FACTORS: Time since onset of injury/illness/exacerbation and 1-2 comorbidities: anxiety, migraines, R RTC  tendinosis  are also affecting patient's functional outcome.   REHAB POTENTIAL: Good  CLINICAL DECISION MAKING: Evolving/moderate complexity  EVALUATION COMPLEXITY: Moderate   GOALS: Goals reviewed with patient? Yes  SHORT TERM GOALS: Target date: 05/09/2023   Patient will be independent with initial HEP.  Baseline: needs Goal status: IN PROGRESS 05/02/23- HEP given   LONG TERM GOALS: Target date: 06/06/2023   Patient will be independent with advanced/ongoing HEP to improve outcomes and carryover.  Baseline:  Goal status: INITIAL  2.  Patient will report 75% improvement in right shoulder pain to improve QOL.  Baseline:  Goal status: INITIAL  3.  Patient will report 75% improvement in R hand paresthesias. Baseline:  Goal status: INITIAL  4.  Patient to improve R shoulder AROM to [facility] without pain provocation to allow for increased ease of ADLs.  Baseline: see objective Goal status: INITIAL  5.  Patient will demonstrate improved functional UE strength as demonstrated by 5/5 R shoulder strength. Baseline:  see objective Goal status: INITIAL  6.  Patient will report 10 points improvement on QuickDash to demonstrate improved functional ability.  Baseline: 77.3% impairment Goal status: INITIAL  7.  Patient will demonstrate 30lb R grip strength   Baseline: 0lbs, keeps dropping things Goal status: INITIAL    PLAN:  PT FREQUENCY: 1-2x/week  PT DURATION: 6 weeks  PLANNED INTERVENTIONS: Therapeutic exercises, Therapeutic activity, Neuromuscular re-education, Balance training, Gait training, Patient/Family education, Self Care, Joint mobilization, Dry Needling, Electrical stimulation, Spinal manipulation, Spinal mobilization, Cryotherapy, Moist heat, Taping, Ultrasound, Ionotophoresis 4mg /ml Dexamethasone, Manual therapy, and Re-evaluation  PLAN FOR NEXT SESSION: Review initial HEP, progress as tolerated manual therapy and modalities for pain control    Jena Gauss, PT 05/02/2023, 5:30 PM

## 2023-05-03 ENCOUNTER — Encounter: Payer: Commercial Managed Care - PPO | Admitting: Family

## 2023-05-05 ENCOUNTER — Ambulatory Visit: Payer: Commercial Managed Care - PPO

## 2023-05-05 DIAGNOSIS — G8929 Other chronic pain: Secondary | ICD-10-CM | POA: Diagnosis not present

## 2023-05-05 DIAGNOSIS — M25611 Stiffness of right shoulder, not elsewhere classified: Secondary | ICD-10-CM | POA: Diagnosis not present

## 2023-05-05 DIAGNOSIS — M25511 Pain in right shoulder: Secondary | ICD-10-CM | POA: Diagnosis not present

## 2023-05-05 DIAGNOSIS — M7581 Other shoulder lesions, right shoulder: Secondary | ICD-10-CM | POA: Diagnosis not present

## 2023-05-05 DIAGNOSIS — M6281 Muscle weakness (generalized): Secondary | ICD-10-CM

## 2023-05-05 NOTE — Therapy (Signed)
OUTPATIENT PHYSICAL THERAPY TREATMENT   Patient Name: Terri Johnson MRN: [id number] DOB:05/06/1966, 57 y.o., female Today's Date: 05/05/2023  END OF SESSION:  PT End of Session - 05/05/23 1704     Visit Number 3    Date for PT Re-Evaluation 06/06/23    Authorization Type [employer] VL: [id number]    PT Start Time 1620    PT Stop Time 1711    PT Time Calculation (min) 51 min    Activity Tolerance Patient limited by pain;Patient tolerated treatment well    Behavior During Therapy [facility] for tasks assessed/performed              Past Medical History:  Diagnosis Date   Allergy    Asthma    B12 deficiency 05/14/2015   Depression    GERD (gastroesophageal reflux disease)    History of chicken pox    Migraines    OSA (obstructive sleep apnea) 03/2016   Past Surgical History:  Procedure Laterality Date   CESAREAN SECTION  1991   CHOLECYSTECTOMY  2007   eardrum repair - left Left    INNER EAR SURGERY     Pt states she had her "eardrum rebuilt". Has had 8 sets of tubes in ears through [employer]   MYRINGOTOMY Bilateral    twice each.   TONSILLECTOMY AND ADENOIDECTOMY  1976   TUBAL LIGATION  2001   Patient Active Problem List   Diagnosis Date Noted   Acute maxillary sinusitis 09/24/2022   Elevated blood pressure reading 09/24/2022   Acute pain of right shoulder 08/16/2022   Reaction to shot 08/16/2022   Seasonal allergies 02/24/2021   Low back pain radiating to right leg 03/19/2018   Overactive bladder 05/06/2016   OSA (obstructive sleep apnea) 03/24/2016   Migraines 03/23/2016   Vitamin D deficiency 08/08/2015   Vitamin B12 deficiency anemia 07/04/2015   B12 deficiency 05/14/2015   Hot flashes 05/11/2015   Preventative health care 05/11/2015   Asthma 03/24/2015   GERD (gastroesophageal reflux disease) 03/24/2015   Anxiety and depression 03/24/2015   Headache 03/24/2015    PCP: Sandford Craze, NP   REFERRING PROVIDER: Richardean Sale, DO   REFERRING DIAG:  [phone] (ICD-10-CM) - Chronic right shoulder pain M75.81 (ICD-10-CM) - Rotator cuff tendonitis, right M25.511 (ICD-10-CM) - Acute pain of right shoulder  THERAPY DIAG:  Acute pain of right shoulder  Stiffness of right shoulder, not elsewhere classified  Muscle weakness (generalized)  Rationale for Evaluation and Treatment: Rehabilitation  ONSET DATE: 08/12/2023  SUBJECTIVE:  SUBJECTIVE STATEMENT: Painful today, increased pain last night no known cause.  Hand dominance: Right  PERTINENT HISTORY: Migraines, OSA, asthma, GERD, B12 deficiency  PAIN:  Are you having pain? Yes: NPRS scale: 6/10 Pain location: R shoulder Pain description: achey, numb, sore to touch, radiates down to 4th, 5th fingers Aggravating factors: reaching behind, across, up; sleeping on R side Relieving factors: keeping it close to my body, meloxicam, tylenol, steroid injections, rest.   PRECAUTIONS: None  WEIGHT BEARING RESTRICTIONS: No  FALLS:  Has patient fallen in last 6 months? No  LIVING ENVIRONMENT: Lives with: lives with their spouse Lives in: House/apartment Stairs: Yes: Internal: 14 steps; on left going up Has following equipment at home: None  OCCUPATION: Works at [employer]  PLOF: Independent  PATIENT GOALS:I don't want to get stiff and hope this pain goes away, I don't want surgery  NEXT MD VISIT: not scheduled  OBJECTIVE:   DIAGNOSTIC FINDINGS:  12/08/2022 MR R shoulder IMPRESSION: 1. Mild tendinosis of the supraspinatus tendon. 2. Moderate tendinosis of the infraspinatus tendon.  PATIENT SURVEYS:  Quick Dash 77.3%  COGNITION: Overall cognitive status: Within functional limits for tasks assessed     SENSATION: WFL  POSTURE: Forward head, rounded shoulders   UPPER  EXTREMITY ROM:   PROM checked in supine, very guarded and painful, = to AROM.   Active ROM Right* eval Left eval  Shoulder flexion 98p! 140  Shoulder extension 32p! 60  Shoulder abduction 80p! 150  Shoulder adduction    Shoulder internal rotation (functional reach) F: To side 40 T10  Shoulder external rotation Functional 40 F:unable 75 F: T2  (Blank rows = not tested)  UPPER EXTREMITY MMT:  MMT Right eval Left eval  Shoulder flexion 2 5  Shoulder extension    Shoulder abduction 2 5  Shoulder adduction    Shoulder internal rotation 2 5  Shoulder external rotation 2 5  Middle trapezius    Lower trapezius    Elbow flexion    Elbow extension    Wrist flexion    Wrist extension    Grip strength (lbs) 0 35  Thumb pinch 5 8.5  (Blank rows = not tested)  SHOULDER SPECIAL TESTS: NT due to pain.   JOINT MOBILITY TESTING:  NT due to pain  PALPATION:  Noted hypomobility in cervical spine, tenderness and spasm throughout cervical paraspinals, R UT, R L/S, R scalenes, elevated 1st rib.    TODAY'S TREATMENT:                                                                                                                                         DATE:  05/05/23 Therapeutic Exercise: to improve strength and mobility.  Demo, verbal and tactile cues throughout for technique. UBE for 3 min R arm passive R shoulder isometrics: Flexion, ER,IR 10x5" each Seated table slide flexion 2x10  Seated table scaption x  10  Seated shoulder rolls x 10   Attempted MT to UT/LS but patient very sensitive unable to complete  Modalities: Estim (IFC) to R shoulder and R UT x 10 min with MHP.    05/02/23 Therapeutic Exercise: to improve strength and mobility.  Demo, verbal and tactile cues throughout for technique. Pulleys flexion x 2 min AAROM for R shoulder Table slides scaption R shoulder x 20 R shoulder isometrics submax contracton - IR 5 x 5 sec hold, ER 5 x 5 sec hold, Extension 5 x 5 sec  hold, flexion 5 x 5 sec hold Manual Therapy: to decrease muscle spasm and pain and improve mobility STM/TPR to R UT, cervical paraspinals, levator scapule Modalities: Estim (premod) to R shoulder and R UT x 10 min with MHP.  Bell placed at side with instructions to ring if assistance needed or discomfort.   04/25/2023 Modalities: Estim (premod) to R shoulder and R UT x 10 min with MHP.  Bell placed at side with instructions to ring if assistance needed or discomfort.    PATIENT EDUCATION: Education details: HEP Person educated: Patient Education method: Explanation Education comprehension: verbalized understanding  HOME EXERCISE PROGRAM: Access Code: [email or web address] URL: [email or web address] Date: 05/02/2023 Prepared by: Harrie Foreman  Exercises - Seated Shoulder Flexion Towel Slide at Table Top  - 1 x daily - 7 x weekly - 3 sets - 10 reps - Isometric Shoulder Internal Rotation  - 3 x daily - 7 x weekly - 1 sets - 5-10 reps - 5 sec hold - Isometric Shoulder External Rotation  - 3 x daily - 7 x weekly - 1 sets - 5-10 reps - 5 sec  hold - Isometric Shoulder Flexion  - 3 x daily - 7 x weekly - 1 sets - 5-10 reps - 5 sec hold - Seated Isometric Shoulder Extension  - 3 x daily - 7 x weekly - 1 sets - 5-10 reps - 5 sec  hold  ASSESSMENT:  CLINICAL IMPRESSION: Akaylah remains mostly limited with movement by R shoulder pain. She was only tolerant of table slides and isometric again today. She was very sensitive to touch in the UT/levator area, may need to try some [place] for desensitization. She did respond well to estim last session so we concluded with this today as well.  KEELAN POMERLEAU continues to demonstrate potential for improvement and would benefit from continued skilled therapy to address impairments.        OBJECTIVE IMPAIRMENTS: decreased ROM, decreased strength, hypomobility, increased fascial restrictions, impaired perceived functional ability, increased muscle spasms,  impaired flexibility, impaired sensation, impaired UE functional use, postural dysfunction, and pain.   ACTIVITY LIMITATIONS: carrying, lifting, bending, sleeping, bathing, dressing, reach over head, and hygiene/grooming  PARTICIPATION LIMITATIONS: meal prep, cleaning, laundry, driving, shopping, and occupation  PERSONAL FACTORS: Time since onset of injury/illness/exacerbation and 1-2 comorbidities: anxiety, migraines, R RTC tendinosis  are also affecting patient's functional outcome.   REHAB POTENTIAL: Good  CLINICAL DECISION MAKING: Evolving/moderate complexity  EVALUATION COMPLEXITY: Moderate   GOALS: Goals reviewed with patient? Yes  SHORT TERM GOALS: Target date: 05/09/2023   Patient will be independent with initial HEP.  Baseline: needs Goal status: IN PROGRESS 05/02/23- HEP given   LONG TERM GOALS: Target date: 06/06/2023   Patient will be independent with advanced/ongoing HEP to improve outcomes and carryover.  Baseline:  Goal status: INITIAL  2.  Patient will report 75% improvement in right shoulder pain to improve QOL.  Baseline:  Goal status: INITIAL  3.  Patient will report 75% improvement in R hand paresthesias. Baseline:  Goal status: INITIAL  4.  Patient to improve R shoulder AROM to [facility] without pain provocation to allow for increased ease of ADLs.  Baseline: see objective Goal status: INITIAL  5.  Patient will demonstrate improved functional UE strength as demonstrated by 5/5 R shoulder strength. Baseline: see objective Goal status: INITIAL  6.  Patient will report 10 points improvement on QuickDash to demonstrate improved functional ability.  Baseline: 77.3% impairment Goal status: INITIAL  7.  Patient will demonstrate 30lb R grip strength   Baseline: 0lbs, keeps dropping things Goal status: INITIAL    PLAN:  PT FREQUENCY: 1-2x/week  PT DURATION: 6 weeks  PLANNED INTERVENTIONS: Therapeutic exercises, Therapeutic activity, Neuromuscular  re-education, Balance training, Gait training, Patient/Family education, Self Care, Joint mobilization, Dry Needling, Electrical stimulation, Spinal manipulation, Spinal mobilization, Cryotherapy, Moist heat, Taping, Ultrasound, Ionotophoresis 4mg /ml Dexamethasone, Manual therapy, and Re-evaluation  PLAN FOR NEXT SESSION: progress as tolerated manual therapy and modalities for pain control, maybe try [place]   Darleene Cleaver, PTA 05/05/2023, 6:04 PM

## 2023-05-09 ENCOUNTER — Ambulatory Visit: Payer: Commercial Managed Care - PPO | Admitting: Physical Therapy

## 2023-05-09 ENCOUNTER — Other Ambulatory Visit (HOSPITAL_COMMUNITY): Payer: Self-pay

## 2023-05-09 MED ORDER — AZITHROMYCIN 250 MG PO TABS
ORAL_TABLET | ORAL | 0 refills | Status: DC
  Filled 2023-05-09: qty 6, 5d supply, fill #0

## 2023-05-12 ENCOUNTER — Ambulatory Visit: Payer: Commercial Managed Care - PPO

## 2023-05-12 DIAGNOSIS — M6281 Muscle weakness (generalized): Secondary | ICD-10-CM

## 2023-05-12 DIAGNOSIS — M25611 Stiffness of right shoulder, not elsewhere classified: Secondary | ICD-10-CM | POA: Diagnosis not present

## 2023-05-12 DIAGNOSIS — M25511 Pain in right shoulder: Secondary | ICD-10-CM | POA: Diagnosis not present

## 2023-05-12 DIAGNOSIS — M7581 Other shoulder lesions, right shoulder: Secondary | ICD-10-CM | POA: Diagnosis not present

## 2023-05-12 DIAGNOSIS — G8929 Other chronic pain: Secondary | ICD-10-CM | POA: Diagnosis not present

## 2023-05-12 NOTE — Therapy (Signed)
OUTPATIENT PHYSICAL THERAPY TREATMENT   Patient Name: Terri Johnson MRN: [id number] DOB:[birth year], 57 y.o., female Today's Date: 05/12/2023  END OF SESSION:  PT End of Session - 05/12/23 1630     Visit Number 4    Date for PT Re-Evaluation 06/06/23    Authorization Type [employer] VL: 25    PT Start Time 1616    PT Stop Time 1701    PT Time Calculation (min) 45 min    Activity Tolerance Patient limited by pain;Patient tolerated treatment well    Behavior During Therapy [facility] for tasks assessed/performed               Past Medical History:  Diagnosis Date   Allergy    Asthma    B12 deficiency 05/14/2015   Depression    GERD (gastroesophageal reflux disease)    History of chicken pox    Migraines    OSA (obstructive sleep apnea) 03/2016   Past Surgical History:  Procedure Laterality Date   CESAREAN SECTION  1991   CHOLECYSTECTOMY  2007   eardrum repair - left Left    INNER EAR SURGERY     Pt states she had her "eardrum rebuilt". Has had 8 sets of tubes in ears through [employer]   MYRINGOTOMY Bilateral    twice each.   TONSILLECTOMY AND ADENOIDECTOMY  1976   TUBAL LIGATION  2001   Patient Active Problem List   Diagnosis Date Noted   Acute maxillary sinusitis 09/24/2022   Elevated blood pressure reading 09/24/2022   Acute pain of right shoulder 08/16/2022   Reaction to shot 08/16/2022   Seasonal allergies 02/24/2021   Low back pain radiating to right leg 03/19/2018   Overactive bladder 05/06/2016   OSA (obstructive sleep apnea) 03/24/2016   Migraines 03/23/2016   Vitamin D deficiency 08/08/2015   Vitamin B12 deficiency anemia 07/04/2015   B12 deficiency 05/14/2015   Hot flashes 05/11/2015   Preventative health care 05/11/2015   Asthma 03/24/2015   GERD (gastroesophageal reflux disease) 03/24/2015   Anxiety and depression 03/24/2015   Headache 03/24/2015    PCP: Sandford Craze, NP   REFERRING PROVIDER: Richardean Sale, DO   REFERRING DIAG:  [phone] (ICD-10-CM) - Chronic right shoulder pain M75.81 (ICD-10-CM) - Rotator cuff tendonitis, right M25.511 (ICD-10-CM) - Acute pain of right shoulder  THERAPY DIAG:  Acute pain of right shoulder  Stiffness of right shoulder, not elsewhere classified  Muscle weakness (generalized)  Rationale for Evaluation and Treatment: Rehabilitation  ONSET DATE: 08/12/2023  SUBJECTIVE:  SUBJECTIVE STATEMENT: Had root canal procedure done on Monday, been in pain ever since.  Hand dominance: Right  PERTINENT HISTORY: Migraines, OSA, asthma, GERD, B12 deficiency  PAIN:  Are you having pain? Yes: NPRS scale: 7/10 Pain location: R shoulder Pain description: achey, numb, sore to touch, radiates down to 4th, 5th fingers Aggravating factors: reaching behind, across, up; sleeping on R side Relieving factors: keeping it close to my body, meloxicam, tylenol, steroid injections, rest.   PRECAUTIONS: None  WEIGHT BEARING RESTRICTIONS: No  FALLS:  Has patient fallen in last 6 months? No  LIVING ENVIRONMENT: Lives with: lives with their spouse Lives in: House/apartment Stairs: Yes: Internal: 14 steps; on left going up Has following equipment at home: None  OCCUPATION: Works at [employer]  PLOF: Independent  PATIENT GOALS:I don't want to get stiff and hope this pain goes away, I don't want surgery  NEXT MD VISIT: not scheduled  OBJECTIVE:   DIAGNOSTIC FINDINGS:  12/08/2022 MR R shoulder IMPRESSION: 1. Mild tendinosis of the supraspinatus tendon. 2. Moderate tendinosis of the infraspinatus tendon.  PATIENT SURVEYS:  Quick Dash 77.3%  COGNITION: Overall cognitive status: Within functional limits for tasks assessed     SENSATION: WFL  POSTURE: Forward head, rounded shoulders    UPPER EXTREMITY ROM:   PROM checked in supine, very guarded and painful, = to AROM.   Active ROM Right* eval Left eval   Shoulder flexion 98p! 140   Shoulder extension 32p! 60   Shoulder abduction 80p! 150   Shoulder adduction     Shoulder internal rotation (functional reach) F: To side 40 T10   Shoulder external rotation Functional 40 F:unable 75 F: T2   (Blank rows = not tested)  UPPER EXTREMITY MMT:  MMT Right eval Left eval  Shoulder flexion 2 5  Shoulder extension    Shoulder abduction 2 5  Shoulder adduction    Shoulder internal rotation 2 5  Shoulder external rotation 2 5  Middle trapezius    Lower trapezius    Elbow flexion    Elbow extension    Wrist flexion    Wrist extension    Grip strength (lbs) 0 35  Thumb pinch 5 8.5  (Blank rows = not tested)  SHOULDER SPECIAL TESTS: NT due to pain.   JOINT MOBILITY TESTING:  NT due to pain  PALPATION:  Noted hypomobility in cervical spine, tenderness and spasm throughout cervical paraspinals, R UT, R L/S, R scalenes, elevated 1st rib.    TODAY'S TREATMENT:                                                                                                                                         DATE:  05/12/23 Therapeutic Exercise: to improve strength and mobility.  Demo, verbal and tactile cues throughout for technique. Supine scap retraction 2 x 10  Supine shoulder flexion AAROM with cane x  5 Supine chest press AAROM with cane x 5 R shoulder isometrics: Flexion, extension, ER 5x5" Seated table slide flexion 2x10  Seated table scaption 2x10  Ultrasound to R UT/levator area x 8 min 1.2 intensity    05/05/23 Therapeutic Exercise: to improve strength and mobility.  Demo, verbal and tactile cues throughout for technique. UBE for 3 min R arm passive R shoulder isometrics: Flexion, ER,IR 10x5" each Seated table slide flexion 2x10  Seated table scaption x 10  Seated shoulder rolls x 10   Attempted MT to  UT/LS but patient very sensitive unable to complete  Modalities: Estim (IFC) to R shoulder and R UT x 10 min with MHP.    05/02/23 Therapeutic Exercise: to improve strength and mobility.  Demo, verbal and tactile cues throughout for technique. Pulleys flexion x 2 min AAROM for R shoulder Table slides scaption R shoulder x 20 R shoulder isometrics submax contracton - IR 5 x 5 sec hold, ER 5 x 5 sec hold, Extension 5 x 5 sec hold, flexion 5 x 5 sec hold Manual Therapy: to decrease muscle spasm and pain and improve mobility STM/TPR to R UT, cervical paraspinals, levator scapule Modalities: Estim (premod) to R shoulder and R UT x 10 min with MHP.  Bell placed at side with instructions to ring if assistance needed or discomfort.   04/25/2023 Modalities: Estim (premod) to R shoulder and R UT x 10 min with MHP.  Bell placed at side with instructions to ring if assistance needed or discomfort.    PATIENT EDUCATION: Education details: HEP Person educated: Patient Education method: Explanation Education comprehension: verbalized understanding  HOME EXERCISE PROGRAM: Access Code: [email or web address] URL: [email or web address] Date: 05/02/2023 Prepared by: Harrie Foreman  Exercises - Seated Shoulder Flexion Towel Slide at Table Top  - 1 x daily - 7 x weekly - 3 sets - 10 reps - Isometric Shoulder Internal Rotation  - 3 x daily - 7 x weekly - 1 sets - 5-10 reps - 5 sec hold - Isometric Shoulder External Rotation  - 3 x daily - 7 x weekly - 1 sets - 5-10 reps - 5 sec  hold - Isometric Shoulder Flexion  - 3 x daily - 7 x weekly - 1 sets - 5-10 reps - 5 sec hold - Seated Isometric Shoulder Extension  - 3 x daily - 7 x weekly - 1 sets - 5-10 reps - 5 sec  hold  ASSESSMENT:  CLINICAL IMPRESSION: Camisha still mostly limited by pain in R shoulder along with recent root canal surgery. Added standing isometrics at doorway rather than self isometrics as patient notes better muscle activation. Trialed  [place] with good response from patient today. JUANICE WARBURTON continues to demonstrate potential for improvement and would benefit from continued skilled therapy to address impairments.        OBJECTIVE IMPAIRMENTS: decreased ROM, decreased strength, hypomobility, increased fascial restrictions, impaired perceived functional ability, increased muscle spasms, impaired flexibility, impaired sensation, impaired UE functional use, postural dysfunction, and pain.   ACTIVITY LIMITATIONS: carrying, lifting, bending, sleeping, bathing, dressing, reach over head, and hygiene/grooming  PARTICIPATION LIMITATIONS: meal prep, cleaning, laundry, driving, shopping, and occupation  PERSONAL FACTORS: Time since onset of injury/illness/exacerbation and 1-2 comorbidities: anxiety, migraines, R RTC tendinosis  are also affecting patient's functional outcome.   REHAB POTENTIAL: Good  CLINICAL DECISION MAKING: Evolving/moderate complexity  EVALUATION COMPLEXITY: Moderate   GOALS: Goals reviewed with patient? Yes  SHORT TERM GOALS: Target date: 05/09/2023   Patient will be  independent with initial HEP.  Baseline: needs Goal status: IN PROGRESS 05/02/23- HEP given   LONG TERM GOALS: Target date: 06/06/2023   Patient will be independent with advanced/ongoing HEP to improve outcomes and carryover.  Baseline:  Goal status: INITIAL  2.  Patient will report 75% improvement in right shoulder pain to improve QOL.  Baseline:  Goal status: INITIAL  3.  Patient will report 75% improvement in R hand paresthesias. Baseline:  Goal status: INITIAL  4.  Patient to improve R shoulder AROM to [facility] without pain provocation to allow for increased ease of ADLs.  Baseline: see objective Goal status: INITIAL  5.  Patient will demonstrate improved functional UE strength as demonstrated by 5/5 R shoulder strength. Baseline: see objective Goal status: INITIAL  6.  Patient will report 10 points improvement on QuickDash to  demonstrate improved functional ability.  Baseline: 77.3% impairment Goal status: INITIAL  7.  Patient will demonstrate 30lb R grip strength   Baseline: 0lbs, keeps dropping things Goal status: INITIAL    PLAN:  PT FREQUENCY: 1-2x/week  PT DURATION: 6 weeks  PLANNED INTERVENTIONS: Therapeutic exercises, Therapeutic activity, Neuromuscular re-education, Balance training, Gait training, Patient/Family education, Self Care, Joint mobilization, Dry Needling, Electrical stimulation, Spinal manipulation, Spinal mobilization, Cryotherapy, Moist heat, Taping, Ultrasound, Ionotophoresis 4mg /ml Dexamethasone, Manual therapy, and Re-evaluation  PLAN FOR NEXT SESSION: progress as tolerated manual therapy and modalities for pain control; R shoulder isometrics   Estelle Skibicki L Norvel Wenker, PTA 05/12/2023, 5:05 PM

## 2023-05-16 ENCOUNTER — Encounter: Payer: Self-pay | Admitting: Physical Therapy

## 2023-05-16 ENCOUNTER — Ambulatory Visit: Payer: Commercial Managed Care - PPO | Admitting: Physical Therapy

## 2023-05-16 DIAGNOSIS — M25611 Stiffness of right shoulder, not elsewhere classified: Secondary | ICD-10-CM

## 2023-05-16 DIAGNOSIS — M25511 Pain in right shoulder: Secondary | ICD-10-CM | POA: Diagnosis not present

## 2023-05-16 DIAGNOSIS — M7581 Other shoulder lesions, right shoulder: Secondary | ICD-10-CM | POA: Diagnosis not present

## 2023-05-16 DIAGNOSIS — M6281 Muscle weakness (generalized): Secondary | ICD-10-CM

## 2023-05-16 DIAGNOSIS — G8929 Other chronic pain: Secondary | ICD-10-CM | POA: Diagnosis not present

## 2023-05-16 NOTE — Therapy (Signed)
OUTPATIENT PHYSICAL THERAPY TREATMENT   Patient Name: Terri Johnson MRN: [id number] DOB:1966-06-06, 57 y.o., female Today's Date: 05/16/2023  END OF SESSION:  PT End of Session - 05/16/23 1613     Visit Number 5    Date for PT Re-Evaluation 06/06/23    Authorization Type [employer] VL: [id number]    PT Start Time 1614    PT Stop Time 1700    PT Time Calculation (min) 46 min    Activity Tolerance Patient limited by pain;Patient tolerated treatment well    Behavior During Therapy [facility] for tasks assessed/performed               Past Medical History:  Diagnosis Date   Allergy    Asthma    B12 deficiency 05/14/2015   Depression    GERD (gastroesophageal reflux disease)    History of chicken pox    Migraines    OSA (obstructive sleep apnea) 03/2016   Past Surgical History:  Procedure Laterality Date   CESAREAN SECTION  1991   CHOLECYSTECTOMY  2007   eardrum repair - left Left    INNER EAR SURGERY     Pt states she had her "eardrum rebuilt". Has had 8 sets of tubes in ears through [employer]   MYRINGOTOMY Bilateral    twice each.   TONSILLECTOMY AND ADENOIDECTOMY  1976   TUBAL LIGATION  2001   Patient Active Problem List   Diagnosis Date Noted   Acute maxillary sinusitis 09/24/2022   Elevated blood pressure reading 09/24/2022   Acute pain of right shoulder 08/16/2022   Reaction to shot 08/16/2022   Seasonal allergies 02/24/2021   Low back pain radiating to right leg 03/19/2018   Overactive bladder 05/06/2016   OSA (obstructive sleep apnea) 03/24/2016   Migraines 03/23/2016   Vitamin D deficiency 08/08/2015   Vitamin B12 deficiency anemia 07/04/2015   B12 deficiency 05/14/2015   Hot flashes 05/11/2015   Preventative health care 05/11/2015   Asthma 03/24/2015   GERD (gastroesophageal reflux disease) 03/24/2015   Anxiety and depression 03/24/2015   Headache 03/24/2015    PCP: Sandford Craze, NP   REFERRING PROVIDER: Richardean Sale, DO   REFERRING DIAG:  [phone] (ICD-10-CM) - Chronic right shoulder pain M75.81 (ICD-10-CM) - Rotator cuff tendonitis, right M25.511 (ICD-10-CM) - Acute pain of right shoulder  THERAPY DIAG:  Acute pain of right shoulder  Stiffness of right shoulder, not elsewhere classified  Muscle weakness (generalized)  Rationale for Evaluation and Treatment: Rehabilitation  ONSET DATE: 08/12/2023  SUBJECTIVE:  SUBJECTIVE STATEMENT: Doing better today.  Hand dominance: Right  PERTINENT HISTORY: Migraines, OSA, asthma, GERD, B12 deficiency  PAIN:  Are you having pain? Yes: NPRS scale: 5/10 Pain location: R shoulder Pain description: achey, numb, sore to touch, radiates down to 4th, 5th fingers Aggravating factors: reaching behind, across, up; sleeping on R side Relieving factors: keeping it close to my body, meloxicam, tylenol, steroid injections, rest.   PRECAUTIONS: None  WEIGHT BEARING RESTRICTIONS: No  FALLS:  Has patient fallen in last 6 months? No  LIVING ENVIRONMENT: Lives with: lives with their spouse Lives in: House/apartment Stairs: Yes: Internal: 14 steps; on left going up Has following equipment at home: None  OCCUPATION: Works at [employer]  PLOF: Independent  PATIENT GOALS:I don't want to get stiff and hope this pain goes away, I don't want surgery  NEXT MD VISIT: not scheduled  OBJECTIVE:   DIAGNOSTIC FINDINGS:  12/08/2022 MR R shoulder IMPRESSION: 1. Mild tendinosis of the supraspinatus tendon. 2. Moderate tendinosis of the infraspinatus tendon.  PATIENT SURVEYS:  Quick Dash 77.3%  COGNITION: Overall cognitive status: Within functional limits for tasks assessed     SENSATION: WFL  POSTURE: Forward head, rounded shoulders   UPPER EXTREMITY ROM:   PROM checked in supine,  very guarded and painful, = to AROM.   Active ROM Right* eval Left eval   Shoulder flexion 98p! 140   Shoulder extension 32p! 60   Shoulder abduction 80p! 150   Shoulder adduction     Shoulder internal rotation (functional reach) F: To side 40 T10   Shoulder external rotation Functional 40 F:unable 75 F: T2   (Blank rows = not tested)  UPPER EXTREMITY MMT:  MMT Right eval Left eval  Shoulder flexion 2 5  Shoulder extension    Shoulder abduction 2 5  Shoulder adduction    Shoulder internal rotation 2 5  Shoulder external rotation 2 5  Middle trapezius    Lower trapezius    Elbow flexion    Elbow extension    Wrist flexion    Wrist extension    Grip strength (lbs) 0 35  Thumb pinch 5 8.5  (Blank rows = not tested)  SHOULDER SPECIAL TESTS: NT due to pain.   JOINT MOBILITY TESTING:  NT due to pain  PALPATION:  Noted hypomobility in cervical spine, tenderness and spasm throughout cervical paraspinals, R UT, R L/S, R scalenes, elevated 1st rib.    TODAY'S TREATMENT:                                                                                                                                         DATE:  05/16/23 Therapeutic Exercise: to improve strength and mobility.  Demo, verbal and tactile cues throughout for technique. Pulleys flexion x 3 min, scaption x 3 min Wall slides flexion x 10 R shoulder isometrics at wall: flexion, extension, ER and  IR 5 x 5 sec hold Ultrasound: x 8 min to R UT 1 MHz, 1.4 w/cm2 cont to decrease inflammation/pain Manual Therapy: to decrease muscle spasm and pain and improve mobility STM/TPR to R UT  05/12/23 Therapeutic Exercise: to improve strength and mobility.  Demo, verbal and tactile cues throughout for technique. Supine scap retraction 2 x 10  Supine shoulder flexion AAROM with cane x 5 Supine chest press AAROM with cane x 5 R shoulder isometrics: Flexion, extension, ER 5x5" Seated table slide flexion 2x10  Seated table  scaption 2x10  Ultrasound to R UT/levator area x 8 min 1.2 intensity    05/05/23 Therapeutic Exercise: to improve strength and mobility.  Demo, verbal and tactile cues throughout for technique. UBE for 3 min R arm passive R shoulder isometrics: Flexion, ER,IR 10x5" each Seated table slide flexion 2x10  Seated table scaption x 10  Seated shoulder rolls x 10   Attempted MT to UT/LS but patient very sensitive unable to complete  Modalities: Estim (IFC) to R shoulder and R UT x 10 min with MHP.     PATIENT EDUCATION: Education details: HEP Person educated: Patient Education method: Explanation Education comprehension: verbalized understanding  HOME EXERCISE PROGRAM: Access Code: [email or web address] URL: [email or web address] Date: 05/02/2023 Prepared by: Harrie Foreman  Exercises - Seated Shoulder Flexion Towel Slide at Table Top  - 1 x daily - 7 x weekly - 3 sets - 10 reps - Isometric Shoulder Internal Rotation  - 3 x daily - 7 x weekly - 1 sets - 5-10 reps - 5 sec hold - Isometric Shoulder External Rotation  - 3 x daily - 7 x weekly - 1 sets - 5-10 reps - 5 sec  hold - Isometric Shoulder Flexion  - 3 x daily - 7 x weekly - 1 sets - 5-10 reps - 5 sec hold - Seated Isometric Shoulder Extension  - 3 x daily - 7 x weekly - 1 sets - 5-10 reps - 5 sec  hold  ASSESSMENT:  CLINICAL IMPRESSION: Terri Johnson reports improvement in R shoulder pain, noted still very sensitive, decreased tolerance to exercise even with wall slides and isometrics.  Less gaurding  and better tolerance today to manual therapy, although still keeping very light touch at this point.  Terri Johnson continues to demonstrate potential for improvement and would benefit from continued skilled therapy to address impairments.        OBJECTIVE IMPAIRMENTS: decreased ROM, decreased strength, hypomobility, increased fascial restrictions, impaired perceived functional ability, increased muscle spasms, impaired  flexibility, impaired sensation, impaired UE functional use, postural dysfunction, and pain.   ACTIVITY LIMITATIONS: carrying, lifting, bending, sleeping, bathing, dressing, reach over head, and hygiene/grooming  PARTICIPATION LIMITATIONS: meal prep, cleaning, laundry, driving, shopping, and occupation  PERSONAL FACTORS: Time since onset of injury/illness/exacerbation and 1-2 comorbidities: anxiety, migraines, R RTC tendinosis  are also affecting patient's functional outcome.   REHAB POTENTIAL: Good  CLINICAL DECISION MAKING: Evolving/moderate complexity  EVALUATION COMPLEXITY: Moderate   GOALS: Goals reviewed with patient? Yes  SHORT TERM GOALS: Target date: 05/09/2023   Patient will be independent with initial HEP.  Baseline: needs Goal status: MET 05/02/23- HEP given 05/16/23- met   LONG TERM GOALS: Target date: 06/06/2023   Patient will be independent with advanced/ongoing HEP to improve outcomes and carryover.  Baseline:  Goal status: IN PROGRESS  2.  Patient will report 75% improvement in right shoulder pain to improve QOL.  Baseline:  Goal status: IN PROGRESS  3.  Patient will  report 75% improvement in R hand paresthesias. Baseline:  Goal status: IN PROGRESS  4.  Patient to improve R shoulder AROM to [facility] without pain provocation to allow for increased ease of ADLs.  Baseline: see objective Goal status: IN PROGRESS  5.  Patient will demonstrate improved functional UE strength as demonstrated by 5/5 R shoulder strength. Baseline: see objective Goal status: IN PROGRESS  6.  Patient will report 10 points improvement on QuickDash to demonstrate improved functional ability.  Baseline: 77.3% impairment Goal status: IN PROGRESS  7.  Patient will demonstrate 30lb R grip strength   Baseline: 0lbs, keeps dropping things Goal status: IN PROGRESS    PLAN:  PT FREQUENCY: 1-2x/week  PT DURATION: 6 weeks  PLANNED INTERVENTIONS: Therapeutic exercises, Therapeutic  activity, Neuromuscular re-education, Balance training, Gait training, Patient/Family education, Self Care, Joint mobilization, Dry Needling, Electrical stimulation, Spinal manipulation, Spinal mobilization, Cryotherapy, Moist heat, Taping, Ultrasound, Ionotophoresis 4mg /ml Dexamethasone, Manual therapy, and Re-evaluation  PLAN FOR NEXT SESSION: progress R shoulder exercises as tolerated.     Jena Gauss, PT 05/16/2023, 5:17 PM

## 2023-05-19 ENCOUNTER — Ambulatory Visit: Payer: Commercial Managed Care - PPO | Attending: Sports Medicine

## 2023-05-19 DIAGNOSIS — M25511 Pain in right shoulder: Secondary | ICD-10-CM | POA: Diagnosis not present

## 2023-05-19 DIAGNOSIS — M6281 Muscle weakness (generalized): Secondary | ICD-10-CM | POA: Diagnosis not present

## 2023-05-19 DIAGNOSIS — M25611 Stiffness of right shoulder, not elsewhere classified: Secondary | ICD-10-CM | POA: Insufficient documentation

## 2023-05-19 NOTE — Therapy (Signed)
OUTPATIENT PHYSICAL THERAPY TREATMENT   Patient Name: Terri Johnson MRN: [id number] DOB:17-Mar-1966, 57 y.o., female Today's Date: 05/19/2023  END OF SESSION:  PT End of Session - 05/19/23 1704     Visit Number 6    Date for PT Re-Evaluation 06/06/23    Authorization Type [employer] VL: 25    PT Start Time 1620    PT Stop Time 1701    PT Time Calculation (min) 41 min    Activity Tolerance Patient tolerated treatment well    Behavior During Therapy [facility] for tasks assessed/performed                Past Medical History:  Diagnosis Date   Allergy    Asthma    B12 deficiency 05/14/2015   Depression    GERD (gastroesophageal reflux disease)    History of chicken pox    Migraines    OSA (obstructive sleep apnea) 03/2016   Past Surgical History:  Procedure Laterality Date   CESAREAN SECTION  1991   CHOLECYSTECTOMY  2007   eardrum repair - left Left    INNER EAR SURGERY     Pt states she had her "eardrum rebuilt". Has had 8 sets of tubes in ears through [employer]   MYRINGOTOMY Bilateral    twice each.   TONSILLECTOMY AND ADENOIDECTOMY  1976   TUBAL LIGATION  2001   Patient Active Problem List   Diagnosis Date Noted   Acute maxillary sinusitis 09/24/2022   Elevated blood pressure reading 09/24/2022   Acute pain of right shoulder 08/16/2022   Reaction to shot 08/16/2022   Seasonal allergies 02/24/2021   Low back pain radiating to right leg 03/19/2018   Overactive bladder 05/06/2016   OSA (obstructive sleep apnea) 03/24/2016   Migraines 03/23/2016   Vitamin D deficiency 08/08/2015   Vitamin B12 deficiency anemia 07/04/2015   B12 deficiency 05/14/2015   Hot flashes 05/11/2015   Preventative health care 05/11/2015   Asthma 03/24/2015   GERD (gastroesophageal reflux disease) 03/24/2015   Anxiety and depression 03/24/2015   Headache 03/24/2015    PCP: Sandford Craze, NP   REFERRING PROVIDER: Richardean Sale, DO   REFERRING DIAG: [id number] (ICD-10-CM)  - Chronic right shoulder pain M75.81 (ICD-10-CM) - Rotator cuff tendonitis, right M25.511 (ICD-10-CM) - Acute pain of right shoulder  THERAPY DIAG:  Acute pain of right shoulder  Stiffness of right shoulder, not elsewhere classified  Muscle weakness (generalized)  Rationale for Evaluation and Treatment: Rehabilitation  ONSET DATE: 08/12/2023  SUBJECTIVE:  SUBJECTIVE STATEMENT: Pt  reports she is getting better, less pain Hand dominance: Right  PERTINENT HISTORY: Migraines, OSA, asthma, GERD, B12 deficiency  PAIN:  Are you having pain? Yes: NPRS scale: 4/10 Pain location: R shoulder Pain description: dull ache radiates down to 4th, 5th fingers Aggravating factors: reaching behind, across, up; sleeping on R side Relieving factors: keeping it close to my body, meloxicam, tylenol, steroid injections, rest.   PRECAUTIONS: None  WEIGHT BEARING RESTRICTIONS: No  FALLS:  Has patient fallen in last 6 months? No  LIVING ENVIRONMENT: Lives with: lives with their spouse Lives in: House/apartment Stairs: Yes: Internal: 14 steps; on left going up Has following equipment at home: None  OCCUPATION: Works at [employer]  PLOF: Independent  PATIENT GOALS:I don't want to get stiff and hope this pain goes away, I don't want surgery  NEXT MD VISIT: not scheduled  OBJECTIVE:   DIAGNOSTIC FINDINGS:  12/08/2022 MR R shoulder IMPRESSION: 1. Mild tendinosis of the supraspinatus tendon. 2. Moderate tendinosis of the infraspinatus tendon.  PATIENT SURVEYS:  Quick Dash 77.3%  COGNITION: Overall cognitive status: Within functional limits for tasks assessed     SENSATION: WFL  POSTURE: Forward head, rounded shoulders   UPPER EXTREMITY ROM:   PROM checked in supine, very guarded and  painful, = to AROM.   Active ROM Right* eval Left eval R 05/19/23  Shoulder flexion 98p! 140 109  Shoulder extension 32p! 60 31  Shoulder abduction 80p! 150 88  Shoulder adduction     Shoulder internal rotation (functional reach) F: To side 40 T10 FIR to coccyx  Shoulder external rotation Functional 40 F:unable 75 F: T2 FER to top of shoulder  (Blank rows = not tested)  UPPER EXTREMITY MMT:  MMT Right eval Left eval  Shoulder flexion 2 5  Shoulder extension    Shoulder abduction 2 5  Shoulder adduction    Shoulder internal rotation 2 5  Shoulder external rotation 2 5  Middle trapezius    Lower trapezius    Elbow flexion    Elbow extension    Wrist flexion    Wrist extension    Grip strength (lbs) 0 35  Thumb pinch 5 8.5  (Blank rows = not tested)  SHOULDER SPECIAL TESTS: NT due to pain.   JOINT MOBILITY TESTING:  NT due to pain  PALPATION:  Noted hypomobility in cervical spine, tenderness and spasm throughout cervical paraspinals, R UT, R L/S, R scalenes, elevated 1st rib.    TODAY'S TREATMENT:                                                                                                                                         DATE:  05/19/23 Therapeutic Exercise: to improve strength and mobility.  Demo, verbal and tactile cues throughout for technique. Pulleys flexion x 3 min, scaption x 3 min Assess R shoulder ROM Standing  rows with YTB x 10  Standing shoulder extension YTB x 5 - than caused pain discontinued R shld ER and IR YTB isometric step out x 5 Wall slides x 10   Ultrasound: x 8 min to R UT 1 MHz, 1.4 w/cm2 cont to decrease inflammation/pain  05/16/23 Therapeutic Exercise: to improve strength and mobility.  Demo, verbal and tactile cues throughout for technique. Pulleys flexion x 3 min, scaption x 3 min Wall slides flexion x 10 R shoulder isometrics at wall: flexion, extension, ER and IR 5 x 5 sec hold Ultrasound: x 8 min to R UT 1 MHz, 1.4 w/cm2  cont to decrease inflammation/pain Manual Therapy: to decrease muscle spasm and pain and improve mobility STM/TPR to R UT  05/12/23 Therapeutic Exercise: to improve strength and mobility.  Demo, verbal and tactile cues throughout for technique. Supine scap retraction 2 x 10  Supine shoulder flexion AAROM with cane x 5 Supine chest press AAROM with cane x 5 R shoulder isometrics: Flexion, extension, ER 5x5" Seated table slide flexion 2x10  Seated table scaption 2x10  Ultrasound to R UT/levator area x 8 min 1.2 intensity    05/05/23 Therapeutic Exercise: to improve strength and mobility.  Demo, verbal and tactile cues throughout for technique. UBE for 3 min R arm passive R shoulder isometrics: Flexion, ER,IR 10x5" each Seated table slide flexion 2x10  Seated table scaption x 10  Seated shoulder rolls x 10   Attempted MT to UT/LS but patient very sensitive unable to complete  Modalities: Estim (IFC) to R shoulder and R UT x 10 min with MHP.     PATIENT EDUCATION: Education details: HEP Person educated: Patient Education method: Explanation Education comprehension: verbalized understanding  HOME EXERCISE PROGRAM: Access Code: [email or web address] URL: [email or web address] Date: 05/02/2023 Prepared by: Harrie Foreman  Exercises - Seated Shoulder Flexion Towel Slide at Table Top  - 1 x daily - 7 x weekly - 3 sets - 10 reps - Isometric Shoulder Internal Rotation  - 3 x daily - 7 x weekly - 1 sets - 5-10 reps - 5 sec hold - Isometric Shoulder External Rotation  - 3 x daily - 7 x weekly - 1 sets - 5-10 reps - 5 sec  hold - Isometric Shoulder Flexion  - 3 x daily - 7 x weekly - 1 sets - 5-10 reps - 5 sec hold - Seated Isometric Shoulder Extension  - 3 x daily - 7 x weekly - 1 sets - 5-10 reps - 5 sec  hold  ASSESSMENT:  CLINICAL IMPRESSION: Terri Johnson demonstrates improvement in R shoulder mobility and pain today. Able to continue with gentle progression of TE. Added rows for  postural strengthening to HEP, discontinued shoulder ext d/t pain. Still very sensitive and tender along R shoulder muscles.  Terri Johnson continues to demonstrate potential for improvement and would benefit from continued skilled therapy to address impairments.        OBJECTIVE IMPAIRMENTS: decreased ROM, decreased strength, hypomobility, increased fascial restrictions, impaired perceived functional ability, increased muscle spasms, impaired flexibility, impaired sensation, impaired UE functional use, postural dysfunction, and pain.   ACTIVITY LIMITATIONS: carrying, lifting, bending, sleeping, bathing, dressing, reach over head, and hygiene/grooming  PARTICIPATION LIMITATIONS: meal prep, cleaning, laundry, driving, shopping, and occupation  PERSONAL FACTORS: Time since onset of injury/illness/exacerbation and 1-2 comorbidities: anxiety, migraines, R RTC tendinosis  are also affecting patient's functional outcome.   REHAB POTENTIAL: Good  CLINICAL DECISION MAKING: Evolving/moderate complexity  EVALUATION COMPLEXITY: Moderate   GOALS:  Goals reviewed with patient? Yes  SHORT TERM GOALS: Target date: 05/09/2023   Patient will be independent with initial HEP.  Baseline: needs Goal status: MET 05/02/23- HEP given 05/16/23- met   LONG TERM GOALS: Target date: 06/06/2023   Patient will be independent with advanced/ongoing HEP to improve outcomes and carryover.  Baseline:  Goal status: IN PROGRESS  2.  Patient will report 75% improvement in right shoulder pain to improve QOL.  Baseline:  Goal status: IN PROGRESS- 60% improved pain 05/19/23  3.  Patient will report 75% improvement in R hand paresthesias. Baseline:  Goal status: IN PROGRESS  4.  Patient to improve R shoulder AROM to [facility] without pain provocation to allow for increased ease of ADLs.  Baseline: see objective Goal status: IN PROGRESS- 05/19/23  5.  Patient will demonstrate improved functional UE strength as demonstrated by  5/5 R shoulder strength. Baseline: see objective Goal status: IN PROGRESS  6.  Patient will report 10 points improvement on QuickDash to demonstrate improved functional ability.  Baseline: 77.3% impairment Goal status: IN PROGRESS  7.  Patient will demonstrate 30lb R grip strength   Baseline: 0lbs, keeps dropping things Goal status: IN PROGRESS    PLAN:  PT FREQUENCY: 1-2x/week  PT DURATION: 6 weeks  PLANNED INTERVENTIONS: Therapeutic exercises, Therapeutic activity, Neuromuscular re-education, Balance training, Gait training, Patient/Family education, Self Care, Joint mobilization, Dry Needling, Electrical stimulation, Spinal manipulation, Spinal mobilization, Cryotherapy, Moist heat, Taping, Ultrasound, Ionotophoresis 4mg /ml Dexamethasone, Manual therapy, and Re-evaluation  PLAN FOR NEXT SESSION: progress R shoulder exercises as tolerated; add postural strengthening    Darleene Cleaver, PTA 05/19/2023, 5:14 PM

## 2023-05-20 ENCOUNTER — Encounter: Payer: Commercial Managed Care - PPO | Admitting: Family

## 2023-05-23 ENCOUNTER — Ambulatory Visit: Payer: Commercial Managed Care - PPO | Admitting: Physical Therapy

## 2023-05-23 ENCOUNTER — Encounter: Payer: Self-pay | Admitting: Physical Therapy

## 2023-05-23 DIAGNOSIS — M25511 Pain in right shoulder: Secondary | ICD-10-CM

## 2023-05-23 DIAGNOSIS — M6281 Muscle weakness (generalized): Secondary | ICD-10-CM | POA: Diagnosis not present

## 2023-05-23 DIAGNOSIS — M25611 Stiffness of right shoulder, not elsewhere classified: Secondary | ICD-10-CM

## 2023-05-23 NOTE — Therapy (Signed)
OUTPATIENT PHYSICAL THERAPY TREATMENT   Patient Name: Terri Johnson MRN: [id number] DOB:Aug 19, 1966, 57 y.o., female Today's Date: 05/23/2023  END OF SESSION:  PT End of Session - 05/23/23 1620     Visit Number 7    Date for PT Re-Evaluation 06/06/23    Authorization Type [employer] VL: [id number]    PT Start Time 1618    PT Stop Time 1700    PT Time Calculation (min) 42 min    Activity Tolerance Patient tolerated treatment well    Behavior During Therapy [facility] for tasks assessed/performed                Past Medical History:  Diagnosis Date   Allergy    Asthma    B12 deficiency 05/14/2015   Depression    GERD (gastroesophageal reflux disease)    History of chicken pox    Migraines    OSA (obstructive sleep apnea) 03/2016   Past Surgical History:  Procedure Laterality Date   CESAREAN SECTION  1991   CHOLECYSTECTOMY  2007   eardrum repair - left Left    INNER EAR SURGERY     Pt states she had her "eardrum rebuilt". Has had 8 sets of tubes in ears through [employer]   MYRINGOTOMY Bilateral    twice each.   TONSILLECTOMY AND ADENOIDECTOMY  1976   TUBAL LIGATION  2001   Patient Active Problem List   Diagnosis Date Noted   Acute maxillary sinusitis 09/24/2022   Elevated blood pressure reading 09/24/2022   Acute pain of right shoulder 08/16/2022   Reaction to shot 08/16/2022   Seasonal allergies 02/24/2021   Low back pain radiating to right leg 03/19/2018   Overactive bladder 05/06/2016   OSA (obstructive sleep apnea) 03/24/2016   Migraines 03/23/2016   Vitamin D deficiency 08/08/2015   Vitamin B12 deficiency anemia 07/04/2015   B12 deficiency 05/14/2015   Hot flashes 05/11/2015   Preventative health care 05/11/2015   Asthma 03/24/2015   GERD (gastroesophageal reflux disease) 03/24/2015   Anxiety and depression 03/24/2015   Headache 03/24/2015    PCP: Sandford Craze, NP   REFERRING PROVIDER: Richardean Sale, DO   REFERRING DIAG: [phone] (ICD-10-CM)  - Chronic right shoulder pain M75.81 (ICD-10-CM) - Rotator cuff tendonitis, right M25.511 (ICD-10-CM) - Acute pain of right shoulder  THERAPY DIAG:  Acute pain of right shoulder  Stiffness of right shoulder, not elsewhere classified  Muscle weakness (generalized)  Rationale for Evaluation and Treatment: Rehabilitation  ONSET DATE: 08/12/2023  SUBJECTIVE:  SUBJECTIVE STATEMENT: Shoulder is doing well, hurting less.  Hand dominance: Right  PERTINENT HISTORY: Migraines, OSA, asthma, GERD, B12 deficiency  PAIN:  Are you having pain? Yes: NPRS scale: 4/10 Pain location: R shoulder Pain description: dull ache radiates down to 4th, 5th fingers Aggravating factors: reaching behind, across, up; sleeping on R side Relieving factors: keeping it close to my body, meloxicam, tylenol, steroid injections, rest.   PRECAUTIONS: None  WEIGHT BEARING RESTRICTIONS: No  FALLS:  Has patient fallen in last 6 months? No  LIVING ENVIRONMENT: Lives with: lives with their spouse Lives in: House/apartment Stairs: Yes: Internal: 14 steps; on left going up Has following equipment at home: None  OCCUPATION: Works at [employer]  PLOF: Independent  PATIENT GOALS:I don't want to get stiff and hope this pain goes away, I don't want surgery  NEXT MD VISIT: not scheduled  OBJECTIVE:   DIAGNOSTIC FINDINGS:  12/08/2022 MR R shoulder IMPRESSION: 1. Mild tendinosis of the supraspinatus tendon. 2. Moderate tendinosis of the infraspinatus tendon.  PATIENT SURVEYS:  Quick Dash 77.3%  COGNITION: Overall cognitive status: Within functional limits for tasks assessed     SENSATION: WFL  POSTURE: Forward head, rounded shoulders   UPPER EXTREMITY ROM:   PROM checked in supine, very guarded and painful, = to  AROM.   Active ROM Right* eval Left eval R 05/19/23  Shoulder flexion 98p! 140 109  Shoulder extension 32p! 60 31  Shoulder abduction 80p! 150 88  Shoulder adduction     Shoulder internal rotation (functional reach) F: To side 40 T10 FIR to coccyx  Shoulder external rotation Functional 40 F:unable 75 F: T2 FER to top of shoulder  (Blank rows = not tested)  UPPER EXTREMITY MMT:  MMT Right eval Left eval  Shoulder flexion 2 5  Shoulder extension    Shoulder abduction 2 5  Shoulder adduction    Shoulder internal rotation 2 5  Shoulder external rotation 2 5  Middle trapezius    Lower trapezius    Elbow flexion    Elbow extension    Wrist flexion    Wrist extension    Grip strength (lbs) 0 35  Thumb pinch 5 8.5  (Blank rows = not tested)  SHOULDER SPECIAL TESTS: NT due to pain.   JOINT MOBILITY TESTING:  NT due to pain  PALPATION:  Noted hypomobility in cervical spine, tenderness and spasm throughout cervical paraspinals, R UT, R L/S, R scalenes, elevated 1st rib.    TODAY'S TREATMENT:                                                                                                                                         DATE:   05/23/2023 Therapeutic Exercise: to improve strength and mobility.  Demo, verbal and tactile cues throughout for technique. Pulleys flexion x 3 min, scaption x 3 min  Wall slides x 10  Scapular slides up wall with foam roller x 10  Ball rolls on wall 2 x 10 ccw & cw AAROM flexion with 1# bar semi reclined 2 x 10  AAROM with bar Shoulder ER 2 x 10  Ultrasound: x 8 min to R UT 1 MHz, 1.4 w/cm2 cont to decrease inflammation/pain  05/19/23 Therapeutic Exercise: to improve strength and mobility.  Demo, verbal and tactile cues throughout for technique. Pulleys flexion x 3 min, scaption x 3 min Assess R shoulder ROM Standing rows with YTB x 10  Standing shoulder extension YTB x 5 - than caused pain discontinued R shld ER and IR YTB isometric step  out x 5 Wall slides x 10   Ultrasound: x 8 min to R UT 1 MHz, 1.4 w/cm2 cont to decrease inflammation/pain  05/16/23 Therapeutic Exercise: to improve strength and mobility.  Demo, verbal and tactile cues throughout for technique. Pulleys flexion x 3 min, scaption x 3 min Wall slides flexion x 10 R shoulder isometrics at wall: flexion, extension, ER and IR 5 x 5 sec hold Ultrasound: x 8 min to R UT 1 MHz, 1.4 w/cm2 cont to decrease inflammation/pain Manual Therapy: to decrease muscle spasm and pain and improve mobility STM/TPR to R UT PATIENT EDUCATION: Education details: continue current HEP Person educated: Patient Education method: Explanation Education comprehension: verbalized understanding  HOME EXERCISE PROGRAM: Access Code: [facility] URL: [email or web address] Date: 05/02/2023 Prepared by: Harrie Foreman  Exercises - Seated Shoulder Flexion Towel Slide at Table Top  - 1 x daily - 7 x weekly - 3 sets - 10 reps - Isometric Shoulder Internal Rotation  - 3 x daily - 7 x weekly - 1 sets - 5-10 reps - 5 sec hold - Isometric Shoulder External Rotation  - 3 x daily - 7 x weekly - 1 sets - 5-10 reps - 5 sec  hold - Isometric Shoulder Flexion  - 3 x daily - 7 x weekly - 1 sets - 5-10 reps - 5 sec hold - Seated Isometric Shoulder Extension  - 3 x daily - 7 x weekly - 1 sets - 5-10 reps - 5 sec  hold  ASSESSMENT:  CLINICAL IMPRESSION: Kamala is reporting general improvement in pain, still limited with R shoulder ROM, even AAROM today still noted wincing with ER and limited flexion, but still progressing.  [place] at end of session to decrease pain.   LEIANNE AUSTEN continues to demonstrate potential for improvement and would benefit from continued skilled therapy to address impairments.        OBJECTIVE IMPAIRMENTS: decreased ROM, decreased strength, hypomobility, increased fascial restrictions, impaired perceived functional ability, increased muscle spasms, impaired  flexibility, impaired sensation, impaired UE functional use, postural dysfunction, and pain.   ACTIVITY LIMITATIONS: carrying, lifting, bending, sleeping, bathing, dressing, reach over head, and hygiene/grooming  PARTICIPATION LIMITATIONS: meal prep, cleaning, laundry, driving, shopping, and occupation  PERSONAL FACTORS: Time since onset of injury/illness/exacerbation and 1-2 comorbidities: anxiety, migraines, R RTC tendinosis  are also affecting patient's functional outcome.   REHAB POTENTIAL: Good  CLINICAL DECISION MAKING: Evolving/moderate complexity  EVALUATION COMPLEXITY: Moderate   GOALS: Goals reviewed with patient? Yes  SHORT TERM GOALS: Target date: 05/09/2023   Patient will be independent with initial HEP.  Baseline: needs Goal status: MET 05/02/23- HEP given 05/16/23- met   LONG TERM GOALS: Target date: 06/06/2023   Patient will be independent with advanced/ongoing HEP to improve outcomes and carryover.  Baseline:  Goal status: IN PROGRESS  2.  Patient  will report 75% improvement in right shoulder pain to improve QOL.  Baseline:  Goal status: IN PROGRESS- 60% improved pain 05/19/23  3.  Patient will report 75% improvement in R hand paresthesias. Baseline:  Goal status: IN PROGRESS  4.  Patient to improve R shoulder AROM to [facility] without pain provocation to allow for increased ease of ADLs.  Baseline: see objective Goal status: IN PROGRESS- 05/19/23  5.  Patient will demonstrate improved functional UE strength as demonstrated by 5/5 R shoulder strength. Baseline: see objective Goal status: IN PROGRESS  6.  Patient will report 10 points improvement on QuickDash to demonstrate improved functional ability.  Baseline: 77.3% impairment Goal status: IN PROGRESS  7.  Patient will demonstrate 30lb R grip strength   Baseline: 0lbs, keeps dropping things Goal status: IN PROGRESS    PLAN:  PT FREQUENCY: 1-2x/week  PT DURATION: 6 weeks  PLANNED INTERVENTIONS:  Therapeutic exercises, Therapeutic activity, Neuromuscular re-education, Balance training, Gait training, Patient/Family education, Self Care, Joint mobilization, Dry Needling, Electrical stimulation, Spinal manipulation, Spinal mobilization, Cryotherapy, Moist heat, Taping, Ultrasound, Ionotophoresis 4mg /ml Dexamethasone, Manual therapy, and Re-evaluation  PLAN FOR NEXT SESSION: progress R shoulder exercises as tolerated; add postural strengthening    Jena Gauss, PT 05/23/2023, 5:07 PM

## 2023-05-26 ENCOUNTER — Ambulatory Visit: Payer: Commercial Managed Care - PPO

## 2023-05-27 ENCOUNTER — Ambulatory Visit: Payer: Commercial Managed Care - PPO | Admitting: Sports Medicine

## 2023-05-30 ENCOUNTER — Encounter: Payer: Commercial Managed Care - PPO | Admitting: Physical Therapy

## 2023-06-01 ENCOUNTER — Ambulatory Visit: Payer: Commercial Managed Care - PPO

## 2023-06-01 DIAGNOSIS — M25611 Stiffness of right shoulder, not elsewhere classified: Secondary | ICD-10-CM | POA: Diagnosis not present

## 2023-06-01 DIAGNOSIS — M25511 Pain in right shoulder: Secondary | ICD-10-CM

## 2023-06-01 DIAGNOSIS — M6281 Muscle weakness (generalized): Secondary | ICD-10-CM

## 2023-06-01 NOTE — Therapy (Signed)
OUTPATIENT PHYSICAL THERAPY TREATMENT   Patient Name: Terri Johnson MRN: [id number] DOB:12-26-1965, 57 y.o., female Today's Date: 06/01/2023  END OF SESSION:  PT End of Session - 06/01/23 1618     Visit Number 8    Date for PT Re-Evaluation 06/06/23    Authorization Type [employer] VL: 25    PT Start Time 1612    PT Stop Time 1654    PT Time Calculation (min) 42 min    Activity Tolerance Patient tolerated treatment well    Behavior During Therapy [facility] for tasks assessed/performed                 Past Medical History:  Diagnosis Date   Allergy    Asthma    B12 deficiency 05/14/2015   Depression    GERD (gastroesophageal reflux disease)    History of chicken pox    Migraines    OSA (obstructive sleep apnea) 03/2016   Past Surgical History:  Procedure Laterality Date   CESAREAN SECTION  1991   CHOLECYSTECTOMY  2007   eardrum repair - left Left    INNER EAR SURGERY     Pt states she had her "eardrum rebuilt". Has had 8 sets of tubes in ears through [employer]   MYRINGOTOMY Bilateral    twice each.   TONSILLECTOMY AND ADENOIDECTOMY  1976   TUBAL LIGATION  2001   Patient Active Problem List   Diagnosis Date Noted   Acute maxillary sinusitis 09/24/2022   Elevated blood pressure reading 09/24/2022   Acute pain of right shoulder 08/16/2022   Reaction to shot 08/16/2022   Seasonal allergies 02/24/2021   Low back pain radiating to right leg 03/19/2018   Overactive bladder 05/06/2016   OSA (obstructive sleep apnea) 03/24/2016   Migraines 03/23/2016   Vitamin D deficiency 08/08/2015   Vitamin B12 deficiency anemia 07/04/2015   B12 deficiency 05/14/2015   Hot flashes 05/11/2015   Preventative health care 05/11/2015   Asthma 03/24/2015   GERD (gastroesophageal reflux disease) 03/24/2015   Anxiety and depression 03/24/2015   Headache 03/24/2015    PCP: Sandford Craze, NP   REFERRING PROVIDER: Richardean Sale, DO   REFERRING DIAG: [phone]  (ICD-10-CM) - Chronic right shoulder pain M75.81 (ICD-10-CM) - Rotator cuff tendonitis, right M25.511 (ICD-10-CM) - Acute pain of right shoulder  THERAPY DIAG:  Acute pain of right shoulder  Stiffness of right shoulder, not elsewhere classified  Muscle weakness (generalized)  Rationale for Evaluation and Treatment: Rehabilitation  ONSET DATE: 08/12/2023  SUBJECTIVE:  SUBJECTIVE STATEMENT: Pt reports not having to take Meloxicam today.   Hand dominance: Right  PERTINENT HISTORY: Migraines, OSA, asthma, GERD, B12 deficiency  PAIN:  Are you having pain? Yes: NPRS scale: 0/10 Pain location: R shoulder Pain description: dull ache radiates down to 4th, 5th fingers Aggravating factors: reaching behind, across, up; sleeping on R side Relieving factors: keeping it close to my body, meloxicam, tylenol, steroid injections, rest.   PRECAUTIONS: None  WEIGHT BEARING RESTRICTIONS: No  FALLS:  Has patient fallen in last 6 months? No  LIVING ENVIRONMENT: Lives with: lives with their spouse Lives in: House/apartment Stairs: Yes: Internal: 14 steps; on left going up Has following equipment at home: None  OCCUPATION: Works at [employer]  PLOF: Independent  PATIENT GOALS:I don't want to get stiff and hope this pain goes away, I don't want surgery  NEXT MD VISIT: not scheduled  OBJECTIVE:   DIAGNOSTIC FINDINGS:  12/08/2022 MR R shoulder IMPRESSION: 1. Mild tendinosis of the supraspinatus tendon. 2. Moderate tendinosis of the infraspinatus tendon.  PATIENT SURVEYS:  Quick Dash 77.3%  COGNITION: Overall cognitive status: Within functional limits for tasks assessed     SENSATION: WFL  POSTURE: Forward head, rounded shoulders   UPPER EXTREMITY ROM:   PROM checked in supine, very  guarded and painful, = to AROM.   Active ROM Right* eval Left eval R 05/19/23  Shoulder flexion 98p! 140 109  Shoulder extension 32p! 60 31  Shoulder abduction 80p! 150 88  Shoulder adduction     Shoulder internal rotation (functional reach) F: To side 40 T10 FIR to coccyx  Shoulder external rotation Functional 40 F:unable 75 F: T2 FER to top of shoulder  (Blank rows = not tested)  UPPER EXTREMITY MMT:  MMT Right eval Left eval  Shoulder flexion 2 5  Shoulder extension    Shoulder abduction 2 5  Shoulder adduction    Shoulder internal rotation 2 5  Shoulder external rotation 2 5  Middle trapezius    Lower trapezius    Elbow flexion    Elbow extension    Wrist flexion    Wrist extension    Grip strength (lbs) 0 35  Thumb pinch 5 8.5  (Blank rows = not tested)  SHOULDER SPECIAL TESTS: NT due to pain.   JOINT MOBILITY TESTING:  NT due to pain  PALPATION:  Noted hypomobility in cervical spine, tenderness and spasm throughout cervical paraspinals, R UT, R L/S, R scalenes, elevated 1st rib.    TODAY'S TREATMENT:                                                                                                                                         DATE:  06/01/23 Therapeutic Exercise: to improve strength and mobility.  Demo, verbal and tactile cues throughout for technique. Pulleys flexion x 3 min, scaption x 3 min  Wall CW/CCW circles  for scapular stability x 10 each way  --progressed another set with quicker amplitude movement Serratus slides with pillowcase up wall x 10  Standing rows and shoulder extensions RTB x 10  AAROM R shoulder ER with cane in standing 10x AAROM flexion x 10; chest press x 10 both with cane and 2# weight wrapped around  05/23/2023 Therapeutic Exercise: to improve strength and mobility.  Demo, verbal and tactile cues throughout for technique. Pulleys flexion x 3 min, scaption x 3 min  Wall slides x 10  Scapular slides up wall with foam  roller x 10  Ball rolls on wall 2 x 10 ccw & cw AAROM flexion with 1# bar semi reclined 2 x 10  AAROM with bar Shoulder ER 2 x 10  Ultrasound: x 8 min to R UT 1 MHz, 1.4 w/cm2 cont to decrease inflammation/pain  05/19/23 Therapeutic Exercise: to improve strength and mobility.  Demo, verbal and tactile cues throughout for technique. Pulleys flexion x 3 min, scaption x 3 min Assess R shoulder ROM Standing rows with YTB x 10  Standing shoulder extension YTB x 5 - than caused pain discontinued R shld ER and IR YTB isometric step out x 5 Wall slides x 10   Ultrasound: x 8 min to R UT 1 MHz, 1.4 w/cm2 cont to decrease inflammation/pain  05/16/23 Therapeutic Exercise: to improve strength and mobility.  Demo, verbal and tactile cues throughout for technique. Pulleys flexion x 3 min, scaption x 3 min Wall slides flexion x 10 R shoulder isometrics at wall: flexion, extension, ER and IR 5 x 5 sec hold Ultrasound: x 8 min to R UT 1 MHz, 1.4 w/cm2 cont to decrease inflammation/pain Manual Therapy: to decrease muscle spasm and pain and improve mobility STM/TPR to R UT PATIENT EDUCATION: Education details: continue current HEP Person educated: Patient Education method: Explanation Education comprehension: verbalized understanding  HOME EXERCISE PROGRAM: Access Code: [facility] URL: [email or web address] Date: 05/02/2023 Prepared by: Harrie Foreman  Exercises - Seated Shoulder Flexion Towel Slide at Table Top  - 1 x daily - 7 x weekly - 3 sets - 10 reps - Isometric Shoulder Internal Rotation  - 3 x daily - 7 x weekly - 1 sets - 5-10 reps - 5 sec hold - Isometric Shoulder External Rotation  - 3 x daily - 7 x weekly - 1 sets - 5-10 reps - 5 sec  hold - Isometric Shoulder Flexion  - 3 x daily - 7 x weekly - 1 sets - 5-10 reps - 5 sec hold - Seated Isometric Shoulder Extension  - 3 x daily - 7 x weekly - 1 sets - 5-10 reps - 5 sec  hold  ASSESSMENT:  CLINICAL IMPRESSION:  Pt  arrived today with no pain reported in R shoulder. Advanced exercises to improve bil shoulder mobility and strength. Pt did note that she is able to do her hair now and has no issues with ROM, however has difficulty with lifting weighted objects with R UE. She shows more need to work on strength to improve work performance and daily activity tolerance. Terri Johnson continues to demonstrate potential for improvement and would benefit from continued skilled therapy to address impairments.        OBJECTIVE IMPAIRMENTS: decreased ROM, decreased strength, hypomobility, increased fascial restrictions, impaired perceived functional ability, increased muscle spasms, impaired flexibility, impaired sensation, impaired UE functional use, postural dysfunction, and pain.   ACTIVITY LIMITATIONS: carrying, lifting, bending, sleeping, bathing, dressing, reach over head, and hygiene/grooming  PARTICIPATION LIMITATIONS: meal prep, cleaning, laundry, driving, shopping, and occupation  PERSONAL FACTORS: Time since onset of injury/illness/exacerbation and 1-2 comorbidities: anxiety, migraines, R RTC tendinosis  are also affecting patient's functional outcome.   REHAB POTENTIAL: Good  CLINICAL DECISION MAKING: Evolving/moderate complexity  EVALUATION COMPLEXITY: Moderate   GOALS: Goals reviewed with patient? Yes  SHORT TERM GOALS: Target date: 05/09/2023   Patient will be independent with initial HEP.  Baseline: needs Goal status: MET 05/02/23- HEP given 05/16/23- met   LONG TERM GOALS: Target date: 06/06/2023   Patient will be independent with advanced/ongoing HEP to improve outcomes and carryover.  Baseline:  Goal status: IN PROGRESS  2.  Patient will report 75% improvement in right shoulder pain to improve QOL.  Baseline:  Goal status: IN PROGRESS- 60% improved pain 05/19/23  3.  Patient will report 75% improvement in R hand paresthesias. Baseline:  Goal status: IN PROGRESS  4.  Patient to  improve R shoulder AROM to [facility] without pain provocation to allow for increased ease of ADLs.  Baseline: see objective Goal status: IN PROGRESS- 05/19/23  5.  Patient will demonstrate improved functional UE strength as demonstrated by 5/5 R shoulder strength. Baseline: see objective Goal status: IN PROGRESS  6.  Patient will report 10 points improvement on QuickDash to demonstrate improved functional ability.  Baseline: 77.3% impairment Goal status: IN PROGRESS  7.  Patient will demonstrate 30lb R grip strength   Baseline: 0lbs, keeps dropping things Goal status: IN PROGRESS    PLAN:  PT FREQUENCY: 1-2x/week  PT DURATION: 6 weeks  PLANNED INTERVENTIONS: Therapeutic exercises, Therapeutic activity, Neuromuscular re-education, Balance training, Gait training, Patient/Family education, Self Care, Joint mobilization, Dry Needling, Electrical stimulation, Spinal manipulation, Spinal mobilization, Cryotherapy, Moist heat, Taping, Ultrasound, Ionotophoresis 4mg /ml Dexamethasone, Manual therapy, and Re-evaluation  PLAN FOR NEXT SESSION: recert; progress R shoulder exercises as tolerated; add postural strengthening    Darleene Cleaver, PTA 06/01/2023, 5:03 PM

## 2023-06-02 ENCOUNTER — Ambulatory Visit: Payer: Commercial Managed Care - PPO

## 2023-06-02 DIAGNOSIS — M25511 Pain in right shoulder: Secondary | ICD-10-CM

## 2023-06-02 DIAGNOSIS — M25611 Stiffness of right shoulder, not elsewhere classified: Secondary | ICD-10-CM

## 2023-06-02 DIAGNOSIS — M6281 Muscle weakness (generalized): Secondary | ICD-10-CM | POA: Diagnosis not present

## 2023-06-02 NOTE — Therapy (Addendum)
OUTPATIENT PHYSICAL THERAPY TREATMENT/ RE CERTIFICATION/Discharge Summary  Progress Note Reporting Period 04/25/2023 to 06/02/2023  See note below for Objective Data and Assessment of Progress/Goals.   I have reviewed notes and Terri Johnson is making good progress and would benefit from continued skilled therapy 1x/week for additional 6-8 weeks to meet all goals.    Patient Name: Terri Johnson MRN: [id number] DOB:03-Oct-1966, 57 y.o., female Today's Date: 06/02/2023  END OF SESSION:  PT End of Session - 06/02/23 1646     Visit Number 9    Date for PT Re-Evaluation 08/01/23    Authorization Type [employer] VL: [id number]    PT Start Time 1617    PT Stop Time 1707    PT Time Calculation (min) 50 min    Activity Tolerance Patient tolerated treatment well    Behavior During [facility] for tasks assessed/performed                  Past Medical History:  Diagnosis Date   Allergy    Asthma    B12 deficiency 05/14/2015   Depression    GERD (gastroesophageal reflux disease)    History of chicken pox    Migraines    OSA (obstructive sleep apnea) 03/2016   Past Surgical History:  Procedure Laterality Date   CESAREAN SECTION  1991   CHOLECYSTECTOMY  2007   eardrum repair - left Left    INNER EAR SURGERY     Pt states she had her "eardrum rebuilt". Has had 8 sets of tubes in ears through [employer]   MYRINGOTOMY Bilateral    twice each.   TONSILLECTOMY AND ADENOIDECTOMY  1976   TUBAL LIGATION  2001   Patient Active Problem List   Diagnosis Date Noted   Acute maxillary sinusitis 09/24/2022   Elevated blood pressure reading 09/24/2022   Acute pain of right shoulder 08/16/2022   Reaction to shot 08/16/2022   Seasonal allergies 02/24/2021   Low back pain radiating to right leg 03/19/2018   Overactive bladder 05/06/2016   OSA (obstructive sleep apnea) 03/24/2016   Migraines 03/23/2016   Vitamin D deficiency 08/08/2015   Vitamin B12 deficiency anemia 07/04/2015   B12  deficiency 05/14/2015   Hot flashes 05/11/2015   Preventative health care 05/11/2015   Asthma 03/24/2015   GERD (gastroesophageal reflux disease) 03/24/2015   Anxiety and depression 03/24/2015   Headache 03/24/2015    PCP: Sandford Craze, NP   REFERRING PROVIDER: Richardean Sale, DO   REFERRING DIAG: [id number] (ICD-10-CM) - Chronic right shoulder pain M75.81 (ICD-10-CM) - Rotator cuff tendonitis, right M25.511 (ICD-10-CM) - Acute pain of right shoulder  THERAPY DIAG:  Acute pain of right shoulder  Stiffness of right shoulder, not elsewhere classified  Muscle weakness (generalized)  Rationale for Evaluation and Treatment: Rehabilitation  ONSET DATE: 08/12/2023  SUBJECTIVE:  SUBJECTIVE STATEMENT: Slightly sore from yesterday.  Hand dominance: Right  PERTINENT HISTORY: Migraines, OSA, asthma, GERD, B12 deficiency  PAIN:  Are you having pain? Yes: NPRS scale: 0/10 Pain location: R shoulder Pain description: dull ache radiates down to 4th, 5th fingers Aggravating factors: reaching behind, across, up; sleeping on R side Relieving factors: keeping it close to my body, meloxicam, tylenol, steroid injections, rest.   PRECAUTIONS: None  WEIGHT BEARING RESTRICTIONS: No  FALLS:  Has patient fallen in last 6 months? No  LIVING ENVIRONMENT: Lives with: lives with their spouse Lives in: House/apartment Stairs: Yes: Internal: 14 steps; on left going up Has following equipment at home: None  OCCUPATION: Works at [employer]  PLOF: Independent  PATIENT GOALS:I don't want to get stiff and hope this pain goes away, I don't want surgery  NEXT MD VISIT: not scheduled  OBJECTIVE:   DIAGNOSTIC FINDINGS:  12/08/2022 MR R shoulder IMPRESSION: 1. Mild tendinosis of the  supraspinatus tendon. 2. Moderate tendinosis of the infraspinatus tendon.  PATIENT SURVEYS:  Quick Dash 77.3%  COGNITION: Overall cognitive status: Within functional limits for tasks assessed     SENSATION: WFL  POSTURE: Forward head, rounded shoulders   UPPER EXTREMITY ROM:   PROM checked in supine, very guarded and painful, = to AROM.   Active ROM Right* eval Left eval R 05/19/23 R 06/02/23  Shoulder flexion 98p! 140 109 115  Shoulder extension 32p! 60 31   Shoulder abduction 80p! 150 88 108- mild pain  Shoulder adduction      Shoulder internal rotation (functional reach) F: To side 40 T10 FIR to coccyx FIR to lumbar spine  Shoulder external rotation Functional 40 F:unable 75 F: T2 FER to top of shoulder FER to nape of the neck  (Blank rows = not tested)  UPPER EXTREMITY MMT:  MMT Right eval Left eval Right 06/02/23  Shoulder flexion 2 5 4   Shoulder extension     Shoulder abduction 2 5 4-  Shoulder adduction     Shoulder internal rotation 2 5 4   Shoulder external rotation 2 5 4-  Middle trapezius     Lower trapezius     Elbow flexion     Elbow extension     Wrist flexion     Wrist extension     Grip strength (lbs) 0 35 10  Thumb pinch 5 8.5   (Blank rows = not tested)  SHOULDER SPECIAL TESTS: NT due to pain.   JOINT MOBILITY TESTING:  NT due to pain  PALPATION:  Noted hypomobility in cervical spine, tenderness and spasm throughout cervical paraspinals, R UT, R L/S, R scalenes, elevated 1st rib.    TODAY'S TREATMENT:                                                                                                                                         DATE: 06/02/23 Therapeutic  Activity: Assessed R shoulder ROM, strength Quick Dash: 40.9 / 100 = 40.9 %  R hand grip strength assessed  Therapeutic Exercise: to improve strength and mobility.  Demo, verbal and tactile cues throughout for technique. Pulleys 3 min flexion and 3 min abduction AAROM IR/ER  behind back with strap x 10  Wall slides flexion R shoulder x 15  Moist heat x 10 min R shoulder  06/01/23 Therapeutic Exercise: to improve strength and mobility.  Demo, verbal and tactile cues throughout for technique. Pulleys flexion x 3 min, scaption x 3 min  Wall CW/CCW circles for scapular stability x 10 each way  --progressed another set with quicker amplitude movement Serratus slides with pillowcase up wall x 10  Standing rows and shoulder extensions RTB x 10  AAROM R shoulder ER with cane in standing 10x AAROM flexion x 10; chest press x 10 both with cane and 2# weight wrapped around  05/23/2023 Therapeutic Exercise: to improve strength and mobility.  Demo, verbal and tactile cues throughout for technique. Pulleys flexion x 3 min, scaption x 3 min  Wall slides x 10  Scapular slides up wall with foam roller x 10  Ball rolls on wall 2 x 10 ccw & cw AAROM flexion with 1# bar semi reclined 2 x 10  AAROM with bar Shoulder ER 2 x 10  Ultrasound: x 8 min to R UT 1 MHz, 1.4 w/cm2 cont to decrease inflammation/pain  05/19/23 Therapeutic Exercise: to improve strength and mobility.  Demo, verbal and tactile cues throughout for technique. Pulleys flexion x 3 min, scaption x 3 min Assess R shoulder ROM Standing rows with YTB x 10  Standing shoulder extension YTB x 5 - than caused pain discontinued R shld ER and IR YTB isometric step out x 5 Wall slides x 10   Ultrasound: x 8 min to R UT 1 MHz, 1.4 w/cm2 cont to decrease inflammation/pain  05/16/23 Therapeutic Exercise: to improve strength and mobility.  Demo, verbal and tactile cues throughout for technique. Pulleys flexion x 3 min, scaption x 3 min Wall slides flexion x 10 R shoulder isometrics at wall: flexion, extension, ER and IR 5 x 5 sec hold Ultrasound: x 8 min to R UT 1 MHz, 1.4 w/cm2 cont to decrease inflammation/pain Manual Therapy: to decrease muscle spasm and pain and improve mobility STM/TPR to R UT PATIENT  EDUCATION: Education details: continue current HEP Person educated: Patient Education method: Explanation Education comprehension: verbalized understanding  HOME EXERCISE PROGRAM: Access Code: [facility] URL: [email or web address] Date: 05/02/2023 Prepared by: Harrie Foreman  Exercises - Seated Shoulder Flexion Towel Slide at Table Top  - 1 x daily - 7 x weekly - 3 sets - 10 reps - Isometric Shoulder Internal Rotation  - 3 x daily - 7 x weekly - 1 sets - 5-10 reps - 5 sec hold - Isometric Shoulder External Rotation  - 3 x daily - 7 x weekly - 1 sets - 5-10 reps - 5 sec  hold - Isometric Shoulder Flexion  - 3 x daily - 7 x weekly - 1 sets - 5-10 reps - 5 sec hold - Seated Isometric Shoulder Extension  - 3 x daily - 7 x weekly - 1 sets - 5-10 reps - 5 sec  hold  ASSESSMENT:  CLINICAL IMPRESSION:  Pt today demonstrates improved R shoulder strength, ROM and grip strength. She has mild pain with R shld abduction. She notes 50% improved numbness in R hand and 60% improvement in her shoulder  pain. She has met goal for [employer]. She mostly reports having difficulty with grabbing weighted objects with R UE only (required for work) and washing her back in the shower. She has progressed very well with goals and so far met LTG #6. She would continue to benefit from skilled therapy to address strength and ROM deficits to improve function and decrease pain with functional activities. Terri Johnson continues to demonstrate potential for improvement and would benefit from continued skilled therapy to address impairments.        OBJECTIVE IMPAIRMENTS: decreased ROM, decreased strength, hypomobility, increased fascial restrictions, impaired perceived functional ability, increased muscle spasms, impaired flexibility, impaired sensation, impaired UE functional use, postural dysfunction, and pain.   ACTIVITY LIMITATIONS: carrying, lifting, bending, sleeping, bathing, dressing, reach over  head, and hygiene/grooming  PARTICIPATION LIMITATIONS: meal prep, cleaning, laundry, driving, shopping, and occupation  PERSONAL FACTORS: Time since onset of injury/illness/exacerbation and 1-2 comorbidities: anxiety, migraines, R RTC tendinosis  are also affecting patient's functional outcome.   REHAB POTENTIAL: Good  CLINICAL DECISION MAKING: Evolving/moderate complexity  EVALUATION COMPLEXITY: Moderate   GOALS: Goals reviewed with patient? Yes  SHORT TERM GOALS: Target date: 05/09/2023   Patient will be independent with initial HEP.  Baseline: needs Goal status: MET 05/02/23- HEP given 05/16/23- met   LONG TERM GOALS: Target date: 06/06/2023 extended to 08/01/23  Patient will be independent with advanced/ongoing HEP to improve outcomes and carryover.  Baseline:  Goal status: IN PROGRESS  2.  Patient will report 75% improvement in right shoulder pain to improve QOL.  Baseline:  Goal status: IN PROGRESS- 60% improved pain 05/19/23  3.  Patient will report 75% improvement in R hand paresthesias. Baseline:  Goal status: IN PROGRESS- 06/02/23 50% improvement  4.  Patient to improve R shoulder AROM to [facility] without pain provocation to allow for increased ease of ADLs.  Baseline: see objective Goal status: IN PROGRESS- 06/02/23  5.  Patient will demonstrate improved functional UE strength as demonstrated by 5/5 R shoulder strength. Baseline: see objective Goal status: IN PROGRESS- 06/02/23  6.  Patient will report 10 points improvement on QuickDash to demonstrate improved functional ability.  Baseline: 77.3% impairment Goal status: MET- 40.9 / 100 = 40.9 %  7.  Patient will demonstrate 30lb R grip strength   Baseline: 0lbs, keeps dropping things Goal status: IN PROGRESS- 06/02/23    PLAN:  PT FREQUENCY: 1x/week  PT DURATION: other: 6-8 weeks    PLANNED INTERVENTIONS: Therapeutic exercises, Therapeutic activity, Neuromuscular re-education, Balance training, Gait training,  Patient/Family education, Self Care, Joint mobilization, Dry Needling, Electrical stimulation, Spinal manipulation, Spinal mobilization, Cryotherapy, Moist heat, Taping, Ultrasound, Ionotophoresis 4mg /ml Dexamethasone, Manual therapy, and Re-evaluation  PLAN FOR NEXT SESSION: progress R shoulder exercises eccentric strengthening; work on behind back movements; progress HEP   Darleene Cleaver, PTA 06/02/2023, 5:51 PM   Jena Gauss, PT  06/06/2023 8:31 AM   PHYSICAL THERAPY DISCHARGE SUMMARY  Visits from Start of Care: 9  Current functional level related to goals / functional outcomes: Improved R shoulder strength, ROM and grip strength. She notes 50% improved numbness in R hand and 60% improvement in her shoulder pain. Quick Dash improved from 77.3% to 40.9%   Remaining deficits: She has mild pain with R shld abduction, numbness in R hand.     Education / Equipment: HEP  Plan: Patient became ill shortly after progress note and has been unable to return to physical therapy.  Since it has been more  than 30+ days since being seen (last visit was on 06/06/23) she would require new order to return and is now discharged.     Jena Gauss, PT, DPT 07/28/2023 3:25 PM

## 2023-06-06 ENCOUNTER — Encounter: Payer: Commercial Managed Care - PPO | Admitting: Physical Therapy

## 2023-06-06 NOTE — Addendum Note (Signed)
Addended by: Jena Gauss on: 06/06/2023 08:35 AM   Modules accepted: Orders

## 2023-06-22 ENCOUNTER — Ambulatory Visit: Payer: Commercial Managed Care - PPO | Admitting: Physical Therapy

## 2023-06-28 ENCOUNTER — Other Ambulatory Visit: Payer: Self-pay

## 2023-06-28 ENCOUNTER — Other Ambulatory Visit (HOSPITAL_COMMUNITY): Payer: Self-pay

## 2023-06-28 ENCOUNTER — Telehealth: Payer: Commercial Managed Care - PPO | Admitting: Family Medicine

## 2023-06-28 DIAGNOSIS — L03317 Cellulitis of buttock: Secondary | ICD-10-CM | POA: Diagnosis not present

## 2023-06-28 MED ORDER — SULFAMETHOXAZOLE-TRIMETHOPRIM 800-160 MG PO TABS
1.0000 | ORAL_TABLET | Freq: Two times a day (BID) | ORAL | 0 refills | Status: AC
  Filled 2023-06-28 (×2): qty 14, 7d supply, fill #0

## 2023-06-28 NOTE — Progress Notes (Signed)
E Visit for Cellulitis  We are sorry that you are not feeling well. Here is how we plan to help!  Based on what you shared with me it looks like you have cellulitis.  Cellulitis looks like areas of skin redness, swelling, and warmth; it develops as a result of bacteria entering under the skin. Little red spots and/or bleeding can be seen in skin, and tiny surface sacs containing fluid can occur. Fever can be present. Cellulitis is almost always on one side of a body, and the lower limbs are the most common site of involvement.   I have prescribed:  Bactrim DS 1 tablet by mouth twice a day for 7 days  HOME CARE:  Take your medications as ordered and take all of them, even if the skin irritation appears to be healing.   GET HELP RIGHT AWAY IF:  Symptoms that don't begin to go away within 48 hours. Severe redness persists or worsens If the area turns color, spreads or swells. If it blisters and opens, develops yellow-brown crust or bleeds. You develop a fever or chills. If the pain increases or becomes unbearable.  Are unable to keep fluids and food down.  MAKE SURE YOU   Understand these instructions. Will watch your condition. Will get help right away if you are not doing well or get worse.  Thank you for choosing an e-visit.  Your e-visit answers were reviewed by a board certified advanced clinical practitioner to complete your personal care plan. Depending upon the condition, your plan could have included both over the counter or prescription medications.  Please review your pharmacy choice. Make sure the pharmacy is open so you can pick up prescription now. If there is a problem, you may contact your provider through MyChart messaging and have the prescription routed to another pharmacy.  Your safety is important to us. If you have drug allergies check your prescription carefully.   For the next 24 hours you can use MyChart to ask questions about today's visit, request a  non-urgent call back, or ask for a work or school excuse. You will get an email in the next two days asking about your experience. I hope that your e-visit has been valuable and will speed your recovery.  I provided 5 minutes of non face-to-face time during this encounter for chart review, medication and order placement, as well as and documentation.   

## 2023-06-30 ENCOUNTER — Ambulatory Visit: Payer: Commercial Managed Care - PPO | Attending: Sports Medicine

## 2023-07-04 ENCOUNTER — Ambulatory Visit: Payer: Commercial Managed Care - PPO

## 2023-07-06 ENCOUNTER — Other Ambulatory Visit (HOSPITAL_BASED_OUTPATIENT_CLINIC_OR_DEPARTMENT_OTHER): Payer: Self-pay

## 2023-07-06 ENCOUNTER — Ambulatory Visit: Payer: Commercial Managed Care - PPO | Admitting: Family

## 2023-07-06 VITALS — BP 136/79 | HR 120 | Temp 98.1°F | Resp 18 | Ht 59.0 in | Wt 177.0 lb

## 2023-07-06 DIAGNOSIS — K529 Noninfective gastroenteritis and colitis, unspecified: Secondary | ICD-10-CM | POA: Diagnosis not present

## 2023-07-06 DIAGNOSIS — L0291 Cutaneous abscess, unspecified: Secondary | ICD-10-CM | POA: Insufficient documentation

## 2023-07-06 MED ORDER — ONDANSETRON HCL 4 MG/2ML IJ SOLN
4.0000 mg | Freq: Once | INTRAMUSCULAR | Status: AC
  Administered 2023-07-06: 4 mg via INTRAMUSCULAR

## 2023-07-06 MED ORDER — SULFAMETHOXAZOLE-TRIMETHOPRIM 800-160 MG PO TABS
1.0000 | ORAL_TABLET | Freq: Two times a day (BID) | ORAL | 0 refills | Status: DC
  Filled 2023-07-06: qty 14, 7d supply, fill #0

## 2023-07-06 MED ORDER — ONDANSETRON HCL 4 MG PO TABS
4.0000 mg | ORAL_TABLET | Freq: Three times a day (TID) | ORAL | 0 refills | Status: DC | PRN
  Filled 2023-07-06: qty 20, 7d supply, fill #0

## 2023-07-06 NOTE — Progress Notes (Signed)
Subjective:     Patient ID: Terri Johnson, female    DOB: 1966/04/05, 57 y.o.   MRN: [id number]  Chief Complaint  Patient presents with   Abscess    Patient comes is with an abscess on right hip    HPI  Discussed the use of AI scribe software for clinical note transcription with the patient, who gave verbal consent to proceed.  History of Present Illness          Patient presents today for abscess on her right hip. Notes that it started about 1 week ago. She did an e-visit on 9/10 and was treated with bactrim.  She notes some improvement but still having significant pain.   She also reports a separate issue of nausea/vomiting and diarrhea which has been present for 2 days.  She does not feel that these symptoms are related to a side effect of the antibiotic.     Health Maintenance Due  Topic Date Due   Cervical Cancer Screening (HPV/Pap Cotest)  06/23/2018   Zoster Vaccines- Shingrix (2 of 2) 07/20/2019   DEXA SCAN  08/19/2019   INFLUENZA VACCINE  05/19/2023   COVID-19 Vaccine (3 - 2023-24 season) 06/19/2023    Past Medical History:  Diagnosis Date   Allergy    Asthma    B12 deficiency 05/14/2015   Depression    GERD (gastroesophageal reflux disease)    History of chicken pox    Migraines    OSA (obstructive sleep apnea) 03/2016    Past Surgical History:  Procedure Laterality Date   CESAREAN SECTION  1991   CHOLECYSTECTOMY  2007   eardrum repair - left Left    INNER EAR SURGERY     Pt states she had her "eardrum rebuilt". Has had 8 sets of tubes in ears through [employer]   MYRINGOTOMY Bilateral    twice each.   TONSILLECTOMY AND ADENOIDECTOMY  1976   TUBAL LIGATION  2001    Family History  Problem Relation Age of Onset   Hyperlipidemia Mother    Heart disease Mother    Hypertension Mother    Depression Mother    Parkinson's disease Father    Depression Maternal Grandmother    Heart disease Maternal Grandfather    Hypertension Maternal Grandfather     Alcohol abuse Maternal Grandfather    AVM Maternal Aunt        congenital   Aneurysm Maternal Aunt    Depression Maternal Aunt    Alcohol abuse Maternal Uncle     Social History   Socioeconomic History   Marital status: Married    Spouse name: Not on file   Number of children: Not on file   Years of education: Not on file   Highest education level: Bachelor's degree (e.g., BA, AB, BS)  Occupational History   Not on file  Tobacco Use   Smoking status: Never   Smokeless tobacco: Never  Substance and Sexual Activity   Alcohol use: No    Alcohol/week: 0.0 standard drinks of alcohol   Drug use: No   Sexual activity: Not on file  Other Topics Concern   Not on file  Social History Narrative   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    Has associates degree   Enjoys painting, animals, antiquing, shopping, reading, movies   Social Determinants of Health   Financial Resource Strain: Low Risk  (07/06/2023)   Overall Financial Resource  Strain (CARDIA)    Difficulty of Paying Living Expenses: Not very hard  Food Insecurity: No Food Insecurity (07/06/2023)   Hunger Vital Sign    Worried About Running Out of Food in the Last Year: Never true    Ran Out of Food in the Last Year: Never true  Transportation Needs: No Transportation Needs (07/06/2023)   PRAPARE - Administrator, Civil Service (Medical): No    Lack of Transportation (Non-Medical): No  Physical Activity: Insufficiently Active (07/06/2023)   Exercise Vital Sign    Days of Exercise per Week: 3 days    Minutes of Exercise per Session: 30 min  Stress: Stress Concern Present (07/06/2023)   [employer] of Occupational Health - Occupational Stress Questionnaire    Feeling of Stress : To some extent  Social Connections: Unknown (07/06/2023)   Social Connection and Isolation Panel [NHANES]    Frequency of Communication with Friends and Family: More than three times a week     Frequency of Social Gatherings with Friends and Family: Once a week    Attends Religious Services: Patient declined    Database administrator or Organizations: Yes    Attends Banker Meetings: Never    Marital Status: Married  Catering manager Violence: Not on file    Outpatient Medications Prior to Visit  Medication Sig Dispense Refill   albuterol (VENTOLIN HFA) 108 (90 Base) MCG/ACT inhaler Inhale 2 puffs into the lungs every 6 (six) hours as needed for wheezing or shortness of breath. 6.7 g 1   betamethasone valerate ointment (VALISONE) 0.1 % Apply small amount to ear as needed for itching/scaling.     betamethasone valerate ointment (VALISONE) 0.1 % Apply to ears as needed for itching. 30 g 1   buPROPion (WELLBUTRIN XL) 300 MG 24 hr tablet Take 1 tablet (300 mg total) by mouth daily. 90 tablet 0   cetirizine (ZYRTEC) 10 MG tablet Take 10 mg by mouth daily.     Cholecalciferol (VITAMIN D3) 125 MCG (5000 UT) CHEW Chew by mouth.     cyanocobalamin (DODEX) 1000 MCG/ML injection Inject 1 mL every 30 days. 12 mL 4   cyclobenzaprine (FLEXERIL) 5 MG tablet Take 1 tablet (5 mg total) by mouth at bedtime. 30 tablet 0   cycloSPORINE (RESTASIS) 0.05 % ophthalmic emulsion Instill 1 drop into both eyes twice a day 180 each 3   escitalopram (LEXAPRO) 20 MG tablet Take 1 tablet (20 mg total) by mouth daily. 90 tablet 0   fluticasone (FLONASE) 50 MCG/ACT nasal spray Place 2 sprays into both nostrils daily. 16 g 6   hydrOXYzine (ATARAX) 10 MG tablet Take 1 tablet (10 mg total) by mouth 3 (three) times daily as needed for itching. 21 tablet 0   meloxicam (MOBIC) 15 MG tablet Take 1 tablet (15 mg total) by mouth daily. 30 tablet 0   montelukast (SINGULAIR) 10 MG tablet Take 1 tablet (10 mg total) by mouth at bedtime. 90 tablet 1   omeprazole (PRILOSEC) 40 MG capsule Take 1 capsule (40 mg total) by mouth daily. 90 capsule 1   SYRINGE-NEEDLE, DISP, 3 ML (B-D 3CC LUER-LOK SYR 25GX1") 25G X 1"  3 ML MISC USE AS DIRECTED WITH VIT B12 INJECTION 12 each 0   benzonatate (TESSALON) 200 MG capsule Take 1 capsule (200 mg total) by mouth 3 (three) times daily as needed for cough. 20 capsule 0   No facility-administered medications prior to visit.    Allergies  Allergen Reactions   Aspirin Other (See Comments)    Gi/ringing ears   Tetracycline Rash    Rash/throat swells    ROS See HPI    Objective:    Physical Exam Constitutional:      General: She is in acute distress (tearful due to pain of abscess).  Cardiovascular:     Rate and Rhythm: Tachycardia present.  Pulmonary:     Effort: Pulmonary effort is normal.  Skin:    Comments: Abscess right lateral hip with medial fluctuance and overall tenderness  Neurological:     Mental Status: She is alert and oriented to person, place, and time.       BP 136/79 (BP Location: Right Arm, Patient Position: Sitting, Cuff Size: Small)   Pulse (!) 120   Temp 98.1 F (36.7 C) (Oral)   Resp 18   Ht 4\' 11"  (1.499 m)   Wt 177 lb (80.3 kg)   LMP 03/23/2014   SpO2 98%   BMI 35.75 kg/m  Wt Readings from Last 3 Encounters:  07/06/23 177 lb (80.3 kg)  04/29/23 182 lb (82.6 kg)  02/03/23 182 lb (82.6 kg)   Diagnosis: abscess - Location: right lateral hip Procedure: Incision & drainage Informed consent:  Discussed risk and benefits.  Informed consent was obtained. Anesthesia: local (lidocaine 1% with epi) The area was prepared and draped in a standard fashion. The lesion drained solidified pus. The lesion was multiloculated. Due to its large size, the cavity was packed with iodoform gauze. The patient tolerated the procedure well. The patient was instructed on post-op care.    Procedure including risks/benefits explained to patient.  Questions were answered. After informed consent was obtained and a time out completed, site was cleansed with betadine and then alcohol. 1% Lidocaine with epinephrine was injected under lesion and  then shave biopsy was performed. Area was cauterized to obtain hemostasis.  Pt tolerated procedure well.  Specimen sent for pathology review.  Pt instructed to keep the area dry for 24 hours and to contact us if he develops redness, drainage or swelling at the site.  Pt may use tylenol as needed for discomfort today.   Assessment & Plan:   Problem List Items Addressed This Visit       Unprioritized   Gastroenteritis    New. IM zofran 4mg  today in office to be followed by prn oral zofran. Encouraged hydration, imodium prn. Follow up if symptoms worsen or if symptoms fail to improve.       Abscess - Primary    New. I and D performed.  Loculations were broken up with sterile swab. Unfortunately the culture medium we had on hand had expired so I did not send for culture.  Recommended that pt remove packing and change dressing tomorrow.  Repeat Bactrium bid x 7 days. Follow up in 1 week.       Relevant Medications   sulfamethoxazole-trimethoprim (BACTRIM DS) 800-160 MG tablet    I have discontinued Lashina D. Sylvester's benzonatate. I have also changed her sulfamethoxazole-trimethoprim. Additionally, I am having her start on ondansetron. Lastly, I am having her maintain her betamethasone valerate ointment, fluticasone, Vitamin D3, cetirizine, B-D 3CC LUER-LOK SYR 25GX1", cycloSPORINE, cyanocobalamin, escitalopram, hydrOXYzine, omeprazole, montelukast, albuterol, meloxicam, cyclobenzaprine, betamethasone valerate ointment, and buPROPion. We administered ondansetron.  Meds ordered this encounter  Medications   sulfamethoxazole-trimethoprim (BACTRIM DS) 800-160 MG tablet    Sig: Take 1 tablet by mouth 2 (two) times daily.    Dispense:  14  tablet    Refill:  0    Order Specific Question:   Supervising Provider    Answer:   Danise Edge A [[id number]]   ondansetron (ZOFRAN) 4 MG tablet    Sig: Take 1 tablet (4 mg total) by mouth every 8 (eight) hours as needed for nausea or vomiting.    Dispense:  20  tablet    Refill:  0    Order Specific Question:   Supervising Provider    Answer:   Danise Edge A [[id number]]   ondansetron (ZOFRAN) injection 4 mg

## 2023-07-06 NOTE — Assessment & Plan Note (Signed)
New. I and D performed.  Loculations were broken up with sterile swab. Unfortunately the culture medium we had on hand had expired so I did not send for culture.  Recommended that pt remove packing and change dressing tomorrow.  Repeat Bactrium bid x 7 days. Follow up in 1 week.

## 2023-07-06 NOTE — Assessment & Plan Note (Signed)
New. IM zofran 4mg  today in office to be followed by prn oral zofran. Encouraged hydration, imodium prn. Follow up if symptoms worsen or if symptoms fail to improve.

## 2023-07-06 NOTE — Patient Instructions (Signed)
VISIT SUMMARY:  During your visit, we discussed two main issues: a skin abscess on your hip, gastrointestinal symptoms including nausea and vomiting.  We have made plans to address each of these concerns.  YOUR PLAN:  -SKIN ABSCESS: The skin abscess is a pocket of pus that has formed under your skin. We have seen some improvement with antibiotics, but the abscess is still present. We performed a procedure to drain the abscess. You should continue taking your antibiotics and come back in a week unless your symptoms get worse.  -NAUSEA AND VOMITING: Your nausea and vomiting are not related to the skin abscess. We're not sure what's causing these symptoms, but we gave you an injection to help with the nausea today and prescribed pills for you to take at home. Please hydrate at home and let us know if your symptoms worsen or fail to improve.   INSTRUCTIONS:  Please follow up in one week for a check on your abscess. If your symptoms worsen, seek immediate medical attention.

## 2023-07-07 ENCOUNTER — Encounter: Payer: Commercial Managed Care - PPO | Admitting: Physical Therapy

## 2023-07-11 ENCOUNTER — Encounter: Payer: Commercial Managed Care - PPO | Admitting: Physical Therapy

## 2023-07-15 ENCOUNTER — Ambulatory Visit: Payer: Commercial Managed Care - PPO | Admitting: Family

## 2023-07-15 ENCOUNTER — Other Ambulatory Visit (HOSPITAL_BASED_OUTPATIENT_CLINIC_OR_DEPARTMENT_OTHER): Payer: Self-pay

## 2023-07-15 DIAGNOSIS — L0291 Cutaneous abscess, unspecified: Secondary | ICD-10-CM

## 2023-07-15 MED ORDER — SULFAMETHOXAZOLE-TRIMETHOPRIM 800-160 MG PO TABS
1.0000 | ORAL_TABLET | Freq: Two times a day (BID) | ORAL | 0 refills | Status: DC
  Filled 2023-07-15: qty 6, 3d supply, fill #0

## 2023-07-15 NOTE — Patient Instructions (Signed)
Continue Bactrim twice daily for 3 more days.

## 2023-07-15 NOTE — Assessment & Plan Note (Signed)
Improving. Will continue an additional 3 days of Bactrim. Pt to watch the area and let me know if the induration dose not shrink and become softer in the coming weeks or if recurrent drainage.

## 2023-07-15 NOTE — Progress Notes (Signed)
Subjective:     Patient ID: Terri Johnson, female    DOB: 1966-01-12, 57 y.o.   MRN: [id number]  Chief Complaint  Patient presents with   Abscess    Here for follow up after I & D of abscess     HPI  History of Present Illness        Pt is a 57 yr old female who presents today for follow up of her right hip abscess. She has completed a 7 day course of Bactrim DS and notes that the are is still sore but much better than it was.       Health Maintenance Due  Topic Date Due   Cervical Cancer Screening (HPV/Pap Cotest)  06/23/2018   Zoster Vaccines- Shingrix (2 of 2) 07/20/2019   DEXA SCAN  08/19/2019   INFLUENZA VACCINE  05/19/2023   COVID-19 Vaccine (3 - 2023-24 season) 06/19/2023    Past Medical History:  Diagnosis Date   Allergy    Asthma    B12 deficiency 05/14/2015   Depression    GERD (gastroesophageal reflux disease)    History of chicken pox    Migraines    OSA (obstructive sleep apnea) 03/2016    Past Surgical History:  Procedure Laterality Date   CESAREAN SECTION  1991   CHOLECYSTECTOMY  2007   eardrum repair - left Left    INNER EAR SURGERY     Pt states she had her "eardrum rebuilt". Has had 8 sets of tubes in ears through [employer]   MYRINGOTOMY Bilateral    twice each.   TONSILLECTOMY AND ADENOIDECTOMY  1976   TUBAL LIGATION  2001    Family History  Problem Relation Age of Onset   Hyperlipidemia Mother    Heart disease Mother    Hypertension Mother    Depression Mother    Parkinson's disease Father    Depression Maternal Grandmother    Heart disease Maternal Grandfather    Hypertension Maternal Grandfather    Alcohol abuse Maternal Grandfather    AVM Maternal Aunt        congenital   Aneurysm Maternal Aunt    Depression Maternal Aunt    Alcohol abuse Maternal Uncle     Social History   Socioeconomic History   Marital status: Married    Spouse name: Not on file   Number of children: Not on file   Years of education: Not on  file   Highest education level: Bachelor's degree (e.g., BA, AB, BS)  Occupational History   Not on file  Tobacco Use   Smoking status: Never   Smokeless tobacco: Never  Substance and Sexual Activity   Alcohol use: No    Alcohol/week: 0.0 standard drinks of alcohol   Drug use: No   Sexual activity: Not on file  Other Topics Concern   Not on file  Social History Narrative   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    Has associates degree   Enjoys painting, animals, antiquing, shopping, reading, movies   Social Determinants of Health   Financial Resource Strain: Low Risk  (07/06/2023)   Overall Financial Resource Strain (CARDIA)    Difficulty of Paying Living Expenses: Not very hard  Food Insecurity: No Food Insecurity (07/06/2023)   Hunger Vital Sign    Worried About Running Out of Food in the Last Year: Never true    Ran Out of Food in the Last  Year: Never true  Transportation Needs: No Transportation Needs (07/06/2023)   PRAPARE - Administrator, Civil Service (Medical): No    Lack of Transportation (Non-Medical): No  Physical Activity: Insufficiently Active (07/06/2023)   Exercise Vital Sign    Days of Exercise per Week: 3 days    Minutes of Exercise per Session: 30 min  Stress: Stress Concern Present (07/06/2023)   [employer] of Occupational Health - Occupational Stress Questionnaire    Feeling of Stress : To some extent  Social Connections: Unknown (07/06/2023)   Social Connection and Isolation Panel [NHANES]    Frequency of Communication with Friends and Family: More than three times a week    Frequency of Social Gatherings with Friends and Family: Once a week    Attends Religious Services: Patient declined    Database administrator or Organizations: Yes    Attends Banker Meetings: Never    Marital Status: Married  Catering manager Violence: Not on file    Outpatient Medications Prior to Visit   Medication Sig Dispense Refill   albuterol (VENTOLIN HFA) 108 (90 Base) MCG/ACT inhaler Inhale 2 puffs into the lungs every 6 (six) hours as needed for wheezing or shortness of breath. 6.7 g 1   betamethasone valerate ointment (VALISONE) 0.1 % Apply small amount to ear as needed for itching/scaling.     betamethasone valerate ointment (VALISONE) 0.1 % Apply to ears as needed for itching. 30 g 1   buPROPion (WELLBUTRIN XL) 300 MG 24 hr tablet Take 1 tablet (300 mg total) by mouth daily. 90 tablet 0   cetirizine (ZYRTEC) 10 MG tablet Take 10 mg by mouth daily.     Cholecalciferol (VITAMIN D3) 125 MCG (5000 UT) CHEW Chew by mouth.     cyanocobalamin (DODEX) 1000 MCG/ML injection Inject 1 mL every 30 days. 12 mL 4   cyclobenzaprine (FLEXERIL) 5 MG tablet Take 1 tablet (5 mg total) by mouth at bedtime. 30 tablet 0   cycloSPORINE (RESTASIS) 0.05 % ophthalmic emulsion Instill 1 drop into both eyes twice a day 180 each 3   escitalopram (LEXAPRO) 20 MG tablet Take 1 tablet (20 mg total) by mouth daily. 90 tablet 0   fluticasone (FLONASE) 50 MCG/ACT nasal spray Place 2 sprays into both nostrils daily. 16 g 6   hydrOXYzine (ATARAX) 10 MG tablet Take 1 tablet (10 mg total) by mouth 3 (three) times daily as needed for itching. 21 tablet 0   meloxicam (MOBIC) 15 MG tablet Take 1 tablet (15 mg total) by mouth daily. 30 tablet 0   montelukast (SINGULAIR) 10 MG tablet Take 1 tablet (10 mg total) by mouth at bedtime. 90 tablet 1   omeprazole (PRILOSEC) 40 MG capsule Take 1 capsule (40 mg total) by mouth daily. 90 capsule 1   ondansetron (ZOFRAN) 4 MG tablet Take 1 tablet (4 mg total) by mouth every 8 (eight) hours as needed for nausea or vomiting. 20 tablet 0   SYRINGE-NEEDLE, DISP, 3 ML (B-D 3CC LUER-LOK SYR 25GX1") 25G X 1" 3 ML MISC USE AS DIRECTED WITH VIT B12 INJECTION 12 each 0   sulfamethoxazole-trimethoprim (BACTRIM DS) 800-160 MG tablet Take 1 tablet by mouth 2 (two) times daily. 14 tablet 0   No  facility-administered medications prior to visit.    Allergies  Allergen Reactions   Aspirin Other (See Comments)    Gi/ringing ears   Tetracycline Rash    Rash/throat swells    ROS  See HPI Objective:    Physical Exam Constitutional:      Appearance: Normal appearance. She is normal weight.  Skin:    Comments: Resolution of surrounding erythema right hip. Approximatly 1 inch wide slightly pink firm induration at the site Mildly tender, no fluctuance  Neurological:     Mental Status: She is alert.      BP 138/71 (BP Location: Right Arm, Patient Position: Sitting, Cuff Size: Large)   Pulse (!) 103   Temp 98 F (36.7 C) (Oral)   Resp 16   Wt 179 lb (81.2 kg)   LMP 03/23/2014   SpO2 97%   BMI 36.15 kg/m  Wt Readings from Last 3 Encounters:  07/15/23 179 lb (81.2 kg)  07/06/23 177 lb (80.3 kg)  04/29/23 182 lb (82.6 kg)       Assessment & Plan:   Problem List Items Addressed This Visit       Unprioritized   Abscess    Improving. Will continue an additional 3 days of Bactrim. Pt to watch the area and let me know if the induration dose not shrink and become softer in the coming weeks or if recurrent drainage.       Relevant Medications   sulfamethoxazole-trimethoprim (BACTRIM DS) 800-160 MG tablet    I am having Terri Johnson maintain her betamethasone valerate ointment, fluticasone, Vitamin D3, cetirizine, B-D 3CC LUER-LOK SYR 25GX1", cycloSPORINE, cyanocobalamin, escitalopram, hydrOXYzine, omeprazole, montelukast, albuterol, meloxicam, cyclobenzaprine, betamethasone valerate ointment, buPROPion, ondansetron, and sulfamethoxazole-trimethoprim.  Meds ordered this encounter  Medications   DISCONTD: sulfamethoxazole-trimethoprim (BACTRIM DS) 800-160 MG tablet    Sig: Take 1 tablet by mouth 2 (two) times daily.    Dispense:  6 tablet    Refill:  0    Order Specific Question:   Supervising Provider    Answer:   Danise Edge A [[id number]]    sulfamethoxazole-trimethoprim (BACTRIM DS) 800-160 MG tablet    Sig: Take 1 tablet by mouth 2 (two) times daily.    Dispense:  6 tablet    Refill:  0    Order Specific Question:   Supervising Provider    Answer:   Danise Edge A [[id number]]

## 2023-08-12 ENCOUNTER — Other Ambulatory Visit: Payer: Self-pay

## 2023-08-12 ENCOUNTER — Other Ambulatory Visit: Payer: Self-pay | Admitting: Family

## 2023-08-12 MED ORDER — BUPROPION HCL ER (XL) 300 MG PO TB24
300.0000 mg | ORAL_TABLET | Freq: Every day | ORAL | 1 refills | Status: DC
  Filled 2023-08-12: qty 90, 90d supply, fill #0
  Filled 2023-11-10: qty 90, 90d supply, fill #1

## 2023-08-12 MED ORDER — ONDANSETRON HCL 4 MG PO TABS
4.0000 mg | ORAL_TABLET | Freq: Three times a day (TID) | ORAL | 0 refills | Status: DC | PRN
  Filled 2023-08-12: qty 20, 7d supply, fill #0

## 2023-08-12 MED ORDER — MONTELUKAST SODIUM 10 MG PO TABS
10.0000 mg | ORAL_TABLET | Freq: Every day | ORAL | 1 refills | Status: DC
  Filled 2023-08-12: qty 90, 90d supply, fill #0
  Filled 2023-11-10: qty 90, 90d supply, fill #1

## 2023-09-29 ENCOUNTER — Other Ambulatory Visit (HOSPITAL_BASED_OUTPATIENT_CLINIC_OR_DEPARTMENT_OTHER): Payer: Self-pay

## 2023-09-29 ENCOUNTER — Ambulatory Visit: Payer: Commercial Managed Care - PPO | Admitting: Family Medicine

## 2023-09-29 VITALS — BP 135/78 | HR 110 | Ht 59.0 in | Wt 184.0 lb

## 2023-09-29 DIAGNOSIS — M5441 Lumbago with sciatica, right side: Secondary | ICD-10-CM

## 2023-09-29 MED ORDER — METHYLPREDNISOLONE 4 MG PO TBPK
ORAL_TABLET | ORAL | 0 refills | Status: DC
  Filled 2023-09-29: qty 21, 6d supply, fill #0

## 2023-09-29 MED ORDER — CYCLOBENZAPRINE HCL 5 MG PO TABS
5.0000 mg | ORAL_TABLET | Freq: Every day | ORAL | 0 refills | Status: DC
  Filled 2023-09-29: qty 30, 30d supply, fill #0

## 2023-09-29 MED ORDER — METHYLPREDNISOLONE ACETATE 40 MG/ML IJ SUSP
40.0000 mg | Freq: Once | INTRAMUSCULAR | Status: AC
  Administered 2023-09-29: 40 mg via INTRAMUSCULAR

## 2023-09-29 NOTE — Progress Notes (Signed)
Acute Office Visit  Subjective:     Patient ID: Terri Johnson, female    DOB: 11-10-1965, 57 y.o.   MRN: [id number]  Chief Complaint  Patient presents with   Back Pain   Hip Pain    HPI Patient is in today for low back pain radiation into hip/buttocks/leg.   Discussed the use of AI scribe software for clinical note transcription with the patient, who gave verbal consent to proceed.  History of Present Illness   The patient presents with acute onset of lower back pain. The pain is bilateral, but predominantly on the right side, extending from the lower back into the hip and buttock. The pain is described as a squeezing sensation, with an intensity of 8 out of 10. The patient denies any recent strenuous activity or trauma, and the onset of pain occurred upon waking. There are no associated symptoms of numbness, tingling, loss of bowel or bladder function, fever, or rash.  The patient's previous episode of back pain in 2019 was managed with a steroid injection, ora taper  and a muscle relaxer, which provided relief. The patient has not experienced any major side effects with steroids in the past. The patient has been managing the current pain with Tylenol, as they do not have any other pain medications at home.  The patient also has a history of shoulder pain, for which they have been taking meloxicam, but they have not taken it recently. The patient's heart rate is typically around 100, and they are aware that steroids can increase heart rate. The patient is advised to monitor their heart rate closely while on steroids.          ROS All review of systems negative except what is listed in the HPI      Objective:    BP 135/78   Pulse (!) 110   Ht 4\' 11"  (1.499 m)   Wt 184 lb (83.5 kg)   LMP 03/23/2014   SpO2 97%   BMI 37.16 kg/m    Physical Exam Vitals reviewed.  Constitutional:      General: She is not in acute distress.    Appearance: Normal appearance. She is not  ill-appearing.  Musculoskeletal:     Comments: Right low back with muscle tension and tenderness; ROM limited by pain  Neurological:     Mental Status: She is alert and oriented to person, place, and time.  Psychiatric:        Mood and Affect: Mood normal.        Behavior: Behavior normal.        Thought Content: Thought content normal.        Judgment: Judgment normal.     No results found for any visits on 09/29/23.      Assessment & Plan:   Problem List Items Addressed This Visit   None Visit Diagnoses       Acute right-sided low back pain with right-sided sciatica    -  Primary   Relevant Medications   cyclobenzaprine (FLEXERIL) 5 MG tablet   methylPREDNISolone (MEDROL) 4 MG TBPK tablet   methylPREDNISolone acetate (DEPO-MEDROL) injection 40 mg (Completed)         Acute Lower Back Pain Severe pain in the lower back, right side, radiating to the hip. No neurological symptoms or systemic symptoms. History of similar episode in 2019. Limited range of motion due to pain. -Administer Depo-Medrol shot today. -Start taper pack tomorrow, monitor for side effects including increased  heart rate. -Prescribe Flexeril for muscle relaxation. -Advise on heat application and gentle stretches - handout provided -Follow-up as needed        Meds ordered this encounter  Medications   cyclobenzaprine (FLEXERIL) 5 MG tablet    Sig: Take 1 tablet (5 mg total) by mouth at bedtime.    Dispense:  30 tablet    Refill:  0    Supervising Provider:   Danise Edge A [[id number]]   methylPREDNISolone (MEDROL) 4 MG TBPK tablet    Sig: Take as directed on package for 6 days.    Dispense:  21 tablet    Refill:  0    Supervising Provider:   Danise Edge A [[id number]]   methylPREDNISolone acetate (DEPO-MEDROL) injection 40 mg    Return if symptoms worsen or fail to improve.  Clayborne Dana, NP

## 2023-10-27 ENCOUNTER — Telehealth: Payer: Commercial Managed Care - PPO | Admitting: Physician Assistant

## 2023-10-27 DIAGNOSIS — B9689 Other specified bacterial agents as the cause of diseases classified elsewhere: Secondary | ICD-10-CM | POA: Diagnosis not present

## 2023-10-27 DIAGNOSIS — J019 Acute sinusitis, unspecified: Secondary | ICD-10-CM | POA: Diagnosis not present

## 2023-10-27 MED ORDER — AMOXICILLIN-POT CLAVULANATE 875-125 MG PO TABS: 1.0000 | ORAL_TABLET | Freq: Two times a day (BID) | ORAL | 0 refills | Status: DC

## 2023-10-27 NOTE — Progress Notes (Signed)

## 2023-10-27 NOTE — Progress Notes (Signed)
 I have spent 5 minutes in review of e-visit questionnaire, review and updating patient chart, medical decision making and response to patient.   Piedad Climes, PA-C

## 2023-11-01 ENCOUNTER — Ambulatory Visit: Payer: Self-pay | Admitting: Family

## 2023-11-01 NOTE — Telephone Encounter (Signed)
 Called a few times but no answer, left detailed message for patient to call us back and see if we can get her in at another [place] location or follow the instructions to go to the ED.

## 2023-11-01 NOTE — Telephone Encounter (Signed)
 Copied from CRM [id number]. Topic: Clinical - Red Word Triage >> Nov 01, 2023  9:01 AM Joanell NOVAK wrote: Red Word that prompted transfer to Nurse Triage: Pt stated that she has been sick for about 3 weeks now and is having chest pain and can feel the pressure in her back as well. Pt stated that she has been taking tylenol  to keep the fever down, loss of breath.   Chief Complaint: Cough x 3wks Symptoms: chest pain, shortness of breath Frequency: constant; worsening Pertinent Negatives: Patient denies fever Disposition: [x] ED /[] Urgent Care (no appt availability in office) / [] Appointment(In office/virtual)/ []  [facility]/ [] Home Care/ [x] Refused Recommended Disposition /[] [facility]/ []  Follow-up with PCP Additional Notes: Pt reports shortness of breath at rest, has had to use inhaler. Pt also reports chest pain worse with deep breaths. Pt refusing ED at this time. Wishes to speak with PCP. Will route HP to clinical pool for follow-up   Reason for Disposition  [1] MODERATE difficulty breathing (e.g., speaks in phrases, SOB even at rest, pulse 100-120) AND [2] still present when not coughing  Chest pain  (Exception: MILD central chest pain, present only when coughing.)  Answer Assessment - Initial Assessment Questions 1. ONSET: When did the cough begin?      3 weeks ago  2. SEVERITY: How bad is the cough today?      Feels worse,. Now having chest and back pain  3. SPUTUM: Describe the color of your sputum (none, dry cough; clear, white, yellow, green)     Thick, white and yellow   4. HEMOPTYSIS: Are you coughing up any blood? If so ask: How much? (flecks, streaks, tablespoons, etc.)     No  5. DIFFICULTY BREATHING: Are you having difficulty breathing? If Yes, ask: How bad is it? (e.g., mild, moderate, severe)    - MILD: No SOB at rest, mild SOB with walking, speaks normally in sentences, can lie down, no retractions, pulse < 100.    - MODERATE: SOB at  rest, SOB with minimal exertion and prefers to sit, cannot lie down flat, speaks in phrases, mild retractions, audible wheezing, pulse 100-120.    - SEVERE: Very SOB at rest, speaks in single words, struggling to breathe, sitting hunched forward, retractions, pulse > 120      Moderate shortness of breath, feels light headed  6. FEVER: Do you have a fever? If Yes, ask: What is your temperature, how was it measured, and when did it start?     Low grade fevers  7. CARDIAC HISTORY: Do you have any history of heart disease? (e.g., heart attack, congestive heart failure)      No  8. LUNG HISTORY: Do you have any history of lung disease?  (e.g., pulmonary embolus, asthma, emphysema)     Asthma  9. PE RISK FACTORS: Do you have a history of blood clots? (or: recent major surgery, recent prolonged travel, bedridden)     No  10. OTHER SYMPTOMS: Do you have any other symptoms? (e.g., runny nose, wheezing, chest pain)       Headache, chest pain, nasal congestion, back pain, shortness of breath  11. PREGNANCY: Is there any chance you are pregnant? When was your last menstrual period?       No  12. TRAVEL: Have you traveled out of the country in the last month? (e.g., travel history, exposures)       Family and coworkers have been sick  Protocols used: Cough -  Acute Productive-A-AH

## 2023-11-01 NOTE — Telephone Encounter (Signed)
 Let's see if someone has an opening today to bring her in. If not, then we have no choice other than ED.

## 2023-11-01 NOTE — Telephone Encounter (Signed)
 FYI. Pt refused ED disposition.

## 2023-11-02 NOTE — Telephone Encounter (Signed)
 Called patient again today, still no answer

## 2023-11-04 NOTE — Telephone Encounter (Signed)
Called patient but no answer, left detailed message for patient to call back and let us know if she was seen for her symptoms.

## 2023-11-10 ENCOUNTER — Other Ambulatory Visit: Payer: Self-pay | Admitting: Family

## 2023-11-10 ENCOUNTER — Other Ambulatory Visit (HOSPITAL_COMMUNITY): Payer: Self-pay

## 2023-11-10 ENCOUNTER — Other Ambulatory Visit: Payer: Self-pay

## 2023-11-10 MED ORDER — OMEPRAZOLE 40 MG PO CPDR
40.0000 mg | DELAYED_RELEASE_CAPSULE | Freq: Every day | ORAL | 1 refills | Status: DC
  Filled 2023-11-10: qty 90, 90d supply, fill #0
  Filled 2024-03-13: qty 90, 90d supply, fill #1

## 2023-11-10 MED ORDER — CYANOCOBALAMIN 1000 MCG/ML IJ SOLN
1000.0000 ug | INTRAMUSCULAR | 4 refills | Status: DC
  Filled 2023-11-10: qty 3, 90d supply, fill #0
  Filled 2024-02-13: qty 3, 90d supply, fill #1
  Filled 2024-06-15: qty 3, 90d supply, fill #2

## 2023-11-11 ENCOUNTER — Other Ambulatory Visit: Payer: Self-pay

## 2024-01-04 ENCOUNTER — Telehealth: Admitting: Physician Assistant

## 2024-01-04 DIAGNOSIS — A084 Viral intestinal infection, unspecified: Secondary | ICD-10-CM | POA: Diagnosis not present

## 2024-01-04 MED ORDER — ONDANSETRON 4 MG PO TBDP: 4.0000 mg | ORAL_TABLET | Freq: Three times a day (TID) | ORAL | 0 refills | Status: DC | PRN

## 2024-01-04 NOTE — Progress Notes (Signed)

## 2024-01-04 NOTE — Progress Notes (Signed)
 I have spent 5 minutes in review of e-visit questionnaire, review and updating patient chart, medical decision making and response to patient.   Piedad Climes, PA-C

## 2024-01-18 ENCOUNTER — Encounter: Payer: Self-pay | Admitting: Family Medicine

## 2024-01-18 ENCOUNTER — Ambulatory Visit (HOSPITAL_BASED_OUTPATIENT_CLINIC_OR_DEPARTMENT_OTHER)
Admission: RE | Admit: 2024-01-18 | Discharge: 2024-01-18 | Disposition: A | Discharge status: Home / Self Care | Source: Ambulatory Visit | Attending: Family Medicine | Admitting: Family Medicine

## 2024-01-18 ENCOUNTER — Other Ambulatory Visit (HOSPITAL_BASED_OUTPATIENT_CLINIC_OR_DEPARTMENT_OTHER): Payer: Self-pay

## 2024-01-18 ENCOUNTER — Ambulatory Visit: Admitting: Family Medicine

## 2024-01-18 VITALS — BP 135/84 | HR 118 | Ht 59.0 in | Wt 174.0 lb

## 2024-01-18 DIAGNOSIS — R1033 Periumbilical pain: Secondary | ICD-10-CM | POA: Diagnosis not present

## 2024-01-18 DIAGNOSIS — R197 Diarrhea, unspecified: Secondary | ICD-10-CM

## 2024-01-18 DIAGNOSIS — K449 Diaphragmatic hernia without obstruction or gangrene: Secondary | ICD-10-CM | POA: Diagnosis not present

## 2024-01-18 DIAGNOSIS — R112 Nausea with vomiting, unspecified: Secondary | ICD-10-CM

## 2024-01-18 DIAGNOSIS — R748 Abnormal levels of other serum enzymes: Secondary | ICD-10-CM

## 2024-01-18 DIAGNOSIS — K439 Ventral hernia without obstruction or gangrene: Secondary | ICD-10-CM | POA: Diagnosis not present

## 2024-01-18 LAB — CBC WITH DIFFERENTIAL/PLATELET
Absolute Lymphocytes: 2606 {cells}/uL (ref 850–3900)
Absolute Monocytes: 569 {cells}/uL (ref 200–950)
Basophils Absolute: 50 {cells}/uL (ref 0–200)
Basophils Relative: 0.7 %
Eosinophils Absolute: 158 {cells}/uL (ref 15–500)
Eosinophils Relative: 2.2 %
HCT: 35.8 % (ref 35.0–45.0)
Hemoglobin: 11.7 g/dL (ref 11.7–15.5)
MCH: 31.1 pg (ref 27.0–33.0)
MCHC: 32.7 g/dL (ref 32.0–36.0)
MCV: 95.2 fL (ref 80.0–100.0)
MPV: 11.6 fL (ref 7.5–12.5)
Monocytes Relative: 7.9 %
Neutro Abs: 3816 {cells}/uL (ref 1500–7800)
Neutrophils Relative %: 53 %
Platelets: 299 10*3/uL (ref 140–400)
RBC: 3.76 10*6/uL — ABNORMAL LOW (ref 3.80–5.10)
RDW: 12.6 % (ref 11.0–15.0)
Total Lymphocyte: 36.2 %
WBC: 7.2 10*3/uL (ref 3.8–10.8)

## 2024-01-18 LAB — COMPREHENSIVE METABOLIC PANEL WITH GFR
AG Ratio: 2 (calc) (ref 1.0–2.5)
ALT: 83 U/L — ABNORMAL HIGH (ref 6–29)
AST: 51 U/L — ABNORMAL HIGH (ref 10–35)
Albumin: 4.5 g/dL (ref 3.6–5.1)
Alkaline phosphatase (APISO): 76 U/L (ref 37–153)
BUN/Creatinine Ratio: 12 (calc) (ref 6–22)
BUN: 13 mg/dL (ref 7–25)
CO2: 27 mmol/L (ref 20–32)
Calcium: 9.4 mg/dL (ref 8.6–10.4)
Chloride: 105 mmol/L (ref 98–110)
Creat: 1.05 mg/dL — ABNORMAL HIGH (ref 0.50–1.03)
Globulin: 2.3 g/dL (ref 1.9–3.7)
Glucose, Bld: 102 mg/dL — ABNORMAL HIGH (ref 65–99)
Potassium: 3.8 mmol/L (ref 3.5–5.3)
Sodium: 140 mmol/L (ref 135–146)
Total Bilirubin: 0.4 mg/dL (ref 0.2–1.2)
Total Protein: 6.8 g/dL (ref 6.1–8.1)
eGFR: 62 mL/min/{1.73_m2} (ref >=60)

## 2024-01-18 MED ORDER — IOHEXOL 300 MG/ML  SOLN
100.0000 mL | Freq: Once | INTRAMUSCULAR | Status: AC | PRN
Start: 2024-01-18 — End: 2024-01-18
  Administered 2024-01-18: 100 mL via INTRAVENOUS

## 2024-01-18 MED ORDER — ONDANSETRON 4 MG PO TBDP
4.0000 mg | ORAL_TABLET | Freq: Three times a day (TID) | ORAL | 0 refills | Status: DC | PRN
  Filled 2024-01-18: qty 20, 7d supply, fill #0

## 2024-01-18 NOTE — Addendum Note (Signed)
 Addended by: Thelma Barge D on: 01/18/2024 04:43 PM   Modules accepted: Orders

## 2024-01-18 NOTE — Progress Notes (Signed)
 Acute Office Visit  Subjective:     Patient ID: Terri Johnson, female    DOB: [birth year], 58 y.o.   MRN: [id number]  Chief Complaint  Patient presents with   Nausea   Diarrhea     Patient is in today for n/v/d, abd pain.   Discussed the use of AI scribe software for clinical note transcription with the patient, who gave verbal consent to proceed.  History of Present Illness Terri Johnson is a 58 year old female with a history of acid reflux and cholecystectomy who presents with nausea, vomiting, and diarrhea.  She has been experiencing worsening gastrointestinal symptoms for the past month, including persistent nausea and diarrhea, which have significantly impacted her daily life, causing her to miss a week of work. The nausea is more prominent than vomiting, although she has vomited occasionally, with the last episode occurring two days ago. The nausea can be so severe that she needs to sit down, even at work. She experiences diarrhea three to four times a day, describing it as 'coming out one end or the other' regardless of food intake. The diarrhea is sometimes reddish-orange with mucus and a vomit-like odor.  She reports abdominal pain around the umbilical region, rated as a 5 out of 10 in severity, which is constant and does not change with eating. The pain is described as feeling like 'tied in knots' and is located in the middle of the abdomen without radiation.  She has a history of acid reflux and cholecystectomy. She takes Zofran, which helps with nausea, but she is running low on her supply. She also takes Prilosec 40 mg, which she has been using regularly. She denies any recent blood work and has not seen a GI doctor recently due to insurance issues.  During the review of symptoms, she reports dark yellow urine, indicating possible dehydration, but no dizziness or lightheadedness. She mentions a consistently high heart rate, which she has not had evaluated.             All review of systems negative except what is listed in the HPI      Objective:    BP 135/84   Pulse (!) 118   Ht 4\' 11"  (1.499 m)   Wt 174 lb (78.9 kg)   LMP 03/23/2014   SpO2 94%   BMI 35.14 kg/m    Physical Exam Vitals reviewed.  Constitutional:      Appearance: Normal appearance. She is ill-appearing.  HENT:     Head: Normocephalic and atraumatic.     Mouth/Throat:     Mouth: Mucous membranes are dry.  Cardiovascular:     Rate and Rhythm: Regular rhythm. Tachycardia present.     Heart sounds: Normal heart sounds.  Pulmonary:     Effort: Pulmonary effort is normal.     Breath sounds: Normal breath sounds.  Abdominal:     General: Abdomen is flat. Bowel sounds are decreased. There is no distension.     Palpations: Abdomen is soft.     Tenderness: There is abdominal tenderness in the periumbilical area. There is guarding.     Hernia: No hernia is present.  Genitourinary:    Comments: Refused rectal exam Skin:    General: Skin is warm and dry.  Neurological:     Mental Status: She is alert and oriented to person, place, and time.  Psychiatric:        Mood and Affect: Mood normal.  Behavior: Behavior normal.        Thought Content: Thought content normal.        Judgment: Judgment normal.     No results found for any visits on 01/18/24.      Assessment & Plan:   Problem List Items Addressed This Visit   None Visit Diagnoses       Nausea vomiting and diarrhea    -  Primary   Relevant Medications   ondansetron (ZOFRAN-ODT) 4 MG disintegrating tablet   Other Relevant Orders   Ambulatory referral to Gastroenterology   CBC with Differential/Platelet   Comprehensive metabolic panel with GFR   Lipase   Amylase   Clostridium Difficile by PCR   Fecal occult blood, imunochemical   Stool culture   CT ABDOMEN PELVIS W CONTRAST     Periumbilical abdominal pain       Relevant Medications   ondansetron (ZOFRAN-ODT) 4 MG  disintegrating tablet   Other Relevant Orders   Ambulatory referral to Gastroenterology   CBC with Differential/Platelet   Comprehensive metabolic panel with GFR   Lipase   Amylase   Clostridium Difficile by PCR   Fecal occult blood, imunochemical   Stool culture   CT ABDOMEN PELVIS W CONTRAST       Assessment & Plan Gastrointestinal symptoms Nausea, vomiting, diarrhea, and abdominal pain for one month. Further investigation needed. - Refilled Zofran for nausea. - Ordered blood work  - Provided stool kit for C. difficile testing and iFOB - declined rectal exam/occult testing today. - Ordered CT scan of abdomen. - Referred to GI specialist. - Advised seeking IV fluids if symptoms worsen or heart rate remains elevated.  Tachycardia Elevated heart rate possibly due to dehydration from gastrointestinal symptoms. She reports this is her baseline, but has never been worked up. - Monitor heart rate - if sustaining about 120 and current symptoms persist - recommend ED evaluation, likely needing fluids and further workup.       Meds ordered this encounter  Medications   ondansetron (ZOFRAN-ODT) 4 MG disintegrating tablet    Sig: Take 1 tablet (4 mg total) by mouth every 8 (eight) hours as needed for nausea or vomiting.    Dispense:  20 tablet    Refill:  0    Supervising Provider:   Danise Edge A [[id number]]    Return if symptoms worsen or fail to improve.  Clayborne Dana, NP

## 2024-01-19 ENCOUNTER — Encounter: Payer: Self-pay | Admitting: Family Medicine

## 2024-01-19 ENCOUNTER — Telehealth: Payer: Self-pay | Admitting: Family

## 2024-01-19 DIAGNOSIS — R7989 Other specified abnormal findings of blood chemistry: Secondary | ICD-10-CM

## 2024-01-19 LAB — AMYLASE: Amylase: 52 U/L (ref 27–131)

## 2024-01-19 LAB — LIPASE: Lipase: 121 U/L — ABNORMAL HIGH (ref 11.0–59.0)

## 2024-01-19 NOTE — Telephone Encounter (Signed)
 I didn't realize that- no need to complete ultrasound.

## 2024-01-19 NOTE — Telephone Encounter (Signed)
 Patient notified

## 2024-01-19 NOTE — Progress Notes (Signed)
 I talked to the radiologist about your labs and CT results - he doesn't think extra imaging is needed right now, suspecting cholestasis. I want you to get in with GI once they call you - any worsening/severe symptoms go to the ED.  Let's plan on rechecking your labs on Monday (fasting) to be sure things aren't worsening. Avoid tylenol and alcohol.  I'll forward to [facility] so she is aware also.

## 2024-01-19 NOTE — Telephone Encounter (Signed)
 Liver function tests are elevated.  I would like for her to complete an [place] to check her for fatty liver.

## 2024-01-19 NOTE — Addendum Note (Signed)
 Addended by: Hyman Hopes B on: 01/19/2024 01:12 PM   Modules accepted: Orders

## 2024-01-20 ENCOUNTER — Other Ambulatory Visit

## 2024-01-20 DIAGNOSIS — R1033 Periumbilical pain: Secondary | ICD-10-CM | POA: Diagnosis not present

## 2024-01-20 DIAGNOSIS — R112 Nausea with vomiting, unspecified: Secondary | ICD-10-CM | POA: Diagnosis not present

## 2024-01-20 DIAGNOSIS — R748 Abnormal levels of other serum enzymes: Secondary | ICD-10-CM

## 2024-01-20 DIAGNOSIS — A0472 Enterocolitis due to Clostridium difficile, not specified as recurrent: Secondary | ICD-10-CM

## 2024-01-20 DIAGNOSIS — R197 Diarrhea, unspecified: Secondary | ICD-10-CM | POA: Diagnosis not present

## 2024-01-22 LAB — CLOSTRIDIUM DIFFICILE BY PCR: Toxigenic C. Difficile by PCR: POSITIVE — AB

## 2024-01-23 ENCOUNTER — Encounter: Payer: Self-pay | Admitting: Family Medicine

## 2024-01-23 ENCOUNTER — Other Ambulatory Visit (HOSPITAL_BASED_OUTPATIENT_CLINIC_OR_DEPARTMENT_OTHER): Payer: Self-pay

## 2024-01-23 ENCOUNTER — Other Ambulatory Visit (INDEPENDENT_AMBULATORY_CARE_PROVIDER_SITE_OTHER)

## 2024-01-23 ENCOUNTER — Other Ambulatory Visit: Payer: Self-pay

## 2024-01-23 DIAGNOSIS — R112 Nausea with vomiting, unspecified: Secondary | ICD-10-CM

## 2024-01-23 DIAGNOSIS — R748 Abnormal levels of other serum enzymes: Secondary | ICD-10-CM | POA: Diagnosis not present

## 2024-01-23 DIAGNOSIS — R1033 Periumbilical pain: Secondary | ICD-10-CM | POA: Diagnosis not present

## 2024-01-23 DIAGNOSIS — R197 Diarrhea, unspecified: Secondary | ICD-10-CM

## 2024-01-23 LAB — HEPATIC FUNCTION PANEL
ALT: 88 U/L — ABNORMAL HIGH (ref 0–35)
AST: 54 U/L — ABNORMAL HIGH (ref 0–37)
Albumin: 5 g/dL (ref 3.5–5.2)
Alkaline Phosphatase: 71 U/L (ref 39–117)
Bilirubin, Direct: 0.1 mg/dL (ref 0.0–0.3)
Total Bilirubin: 0.4 mg/dL (ref 0.2–1.2)
Total Protein: 8.1 g/dL (ref 6.0–8.3)

## 2024-01-23 MED ORDER — VANCOMYCIN HCL 125 MG PO CAPS
125.0000 mg | ORAL_CAPSULE | Freq: Four times a day (QID) | ORAL | 0 refills | Status: AC
  Filled 2024-01-23: qty 40, 10d supply, fill #0

## 2024-01-24 LAB — STOOL CULTURE
E coli, Shiga toxin Assay: NEGATIVE
Stool Culture result 1 (CMPCXR): NEGATIVE

## 2024-01-24 LAB — HEPATITIS A ANTIBODY, IGM: Hep A IgM: NONREACTIVE

## 2024-01-24 LAB — HEPATITIS B CORE ANTIBODY, IGM: Hep B C IgM: NONREACTIVE

## 2024-01-24 LAB — HEPATITIS C ANTIBODY: Hepatitis C Ab: NONREACTIVE

## 2024-01-24 LAB — HEPATITIS B SURFACE ANTIBODY, QUANTITATIVE: Hep B S AB Quant (Post): 317 m[IU]/mL (ref >=10)

## 2024-01-25 ENCOUNTER — Encounter: Payer: Self-pay | Admitting: Family Medicine

## 2024-01-27 ENCOUNTER — Encounter: Payer: Self-pay | Admitting: Family Medicine

## 2024-01-27 NOTE — Telephone Encounter (Signed)
 Yes, as long as diarrhea does not restart, Monday is good return date!

## 2024-02-13 ENCOUNTER — Encounter: Payer: Self-pay | Admitting: Family Medicine

## 2024-02-13 ENCOUNTER — Other Ambulatory Visit: Payer: Self-pay | Admitting: Family

## 2024-02-13 ENCOUNTER — Other Ambulatory Visit: Payer: Self-pay | Admitting: Family Medicine

## 2024-02-13 DIAGNOSIS — R1033 Periumbilical pain: Secondary | ICD-10-CM

## 2024-02-13 DIAGNOSIS — R197 Diarrhea, unspecified: Secondary | ICD-10-CM

## 2024-02-13 DIAGNOSIS — R112 Nausea with vomiting, unspecified: Secondary | ICD-10-CM

## 2024-02-14 ENCOUNTER — Other Ambulatory Visit (HOSPITAL_COMMUNITY): Payer: Self-pay

## 2024-02-14 ENCOUNTER — Other Ambulatory Visit: Payer: Self-pay

## 2024-02-14 ENCOUNTER — Other Ambulatory Visit

## 2024-02-14 MED ORDER — BUPROPION HCL ER (XL) 300 MG PO TB24
300.0000 mg | ORAL_TABLET | Freq: Every day | ORAL | 0 refills | Status: DC
  Filled 2024-02-14: qty 30, 30d supply, fill #0

## 2024-02-14 MED ORDER — MONTELUKAST SODIUM 10 MG PO TABS
10.0000 mg | ORAL_TABLET | Freq: Every day | ORAL | 0 refills | Status: DC
  Filled 2024-02-14: qty 30, 30d supply, fill #0

## 2024-02-14 MED ORDER — ONDANSETRON 4 MG PO TBDP
4.0000 mg | ORAL_TABLET | Freq: Three times a day (TID) | ORAL | 0 refills | Status: DC | PRN
  Filled 2024-02-14: qty 20, 7d supply, fill #0

## 2024-02-16 ENCOUNTER — Other Ambulatory Visit

## 2024-02-16 DIAGNOSIS — R197 Diarrhea, unspecified: Secondary | ICD-10-CM | POA: Diagnosis not present

## 2024-02-18 LAB — CLOSTRIDIUM DIFFICILE BY PCR: Toxigenic C. Difficile by PCR: NEGATIVE

## 2024-02-20 ENCOUNTER — Encounter: Payer: Self-pay | Admitting: Family Medicine

## 2024-03-13 ENCOUNTER — Other Ambulatory Visit: Payer: Self-pay | Admitting: Family

## 2024-03-13 MED ORDER — BUPROPION HCL ER (XL) 300 MG PO TB24
300.0000 mg | ORAL_TABLET | Freq: Every day | ORAL | 0 refills | Status: DC
  Filled 2024-03-13: qty 90, 90d supply, fill #0

## 2024-03-13 MED ORDER — MONTELUKAST SODIUM 10 MG PO TABS
10.0000 mg | ORAL_TABLET | Freq: Every day | ORAL | 0 refills | Status: DC
  Filled 2024-03-13: qty 90, 90d supply, fill #0

## 2024-03-14 ENCOUNTER — Other Ambulatory Visit (HOSPITAL_COMMUNITY): Payer: Self-pay

## 2024-03-14 ENCOUNTER — Other Ambulatory Visit: Payer: Self-pay

## 2024-05-17 NOTE — Progress Notes (Unsigned)
 Terri Johnson Terri Johnson [facility] [address] [place] [address] Phone: [phone]   Assessment and Plan:     There are no diagnoses linked to this encounter.  ***   Pertinent previous records reviewed include ***    Follow Up: ***     Subjective:   I, Aseel Truxillo, am serving as a Neurosurgeon for Doctor Morene Mace   Chief Complaint: Right shoulder pain    HPI:    08/30/22 Patient is a 58 year old female complaining of right shoulder pain. Patient states She received an influenza vaccine on 08/11/2022 and complains of right arm pain that radiates to the right side of her neck. When she initially received the injection, she reported of usual muscle aches however, symptoms worsened from then. She states that when she injected the shot, it was more painful than usual. During the evening, she tosses between different sides. She has taken Ibuprofen, Aleve , and Tylenol  but symptoms are still persistent.  she states she has a weird sensation down her arm it feels heavy and dead    10/18/22 Patient states she has pain at night and up through her trap she has more ROM, but still has painful arcs    10/04/2022 Patient states she says she is good when taking the flexeril  and voltaren  but in pain when she doesn't take it    11/08/2022 Patient states she is hurting, CSI lasted 3 weeks   01/06/2023 Patient states she is the same    02/03/2023 Patient states a little better , intermittent days of no pain and the days of decreased Rom with a dull pain   04/29/2023 Patient states she started PT and it was tough. Would like another round of meloxicam  and flexeril     05/18/2024 Patient states   Relevant Historical Information: GERD,  Additional pertinent review of systems negative.   Current Outpatient Medications:    albuterol  (VENTOLIN  HFA) 108 (90 Base) MCG/ACT inhaler, Inhale 2 puffs into the lungs every 6 (six) hours as needed for wheezing  or shortness of breath., Disp: 6.7 g, Rfl: 1   betamethasone  valerate ointment (VALISONE ) 0.1 %, Apply small amount to ear as needed for itching/scaling., Disp: , Rfl:    betamethasone  valerate ointment (VALISONE ) 0.1 %, Apply to ears as needed for itching., Disp: 30 g, Rfl: 1   buPROPion  (WELLBUTRIN  XL) 300 MG 24 hr tablet, Take 1 tablet (300 mg total) by mouth daily., Disp: 90 tablet, Rfl: 0   cetirizine (ZYRTEC) 10 MG tablet, Take 10 mg by mouth daily., Disp: , Rfl:    Cholecalciferol (VITAMIN D3) 125 MCG (5000 UT) CHEW, Chew by mouth., Disp: , Rfl:    cyanocobalamin  (DODEX ) 1000 MCG/ML injection, Inject 1 mL every 30 days., Disp: 12 mL, Rfl: 4   cycloSPORINE  (RESTASIS ) 0.05 % ophthalmic emulsion, Instill 1 drop into both eyes twice a day, Disp: 180 each, Rfl: 3   escitalopram  (LEXAPRO ) 20 MG tablet, Take 1 tablet (20 mg total) by mouth daily., Disp: 90 tablet, Rfl: 0   fluticasone  (FLONASE ) 50 MCG/ACT nasal spray, Place 2 sprays into both nostrils daily., Disp: 16 g, Rfl: 6   hydrOXYzine  (ATARAX ) 10 MG tablet, Take 1 tablet (10 mg total) by mouth 3 (three) times daily as needed for itching., Disp: 21 tablet, Rfl: 0   meloxicam  (MOBIC ) 15 MG tablet, Take 1 tablet (15 mg total) by mouth daily., Disp: 30 tablet, Rfl: 0   montelukast  (SINGULAIR ) 10 MG tablet,  Take 1 tablet (10 mg total) by mouth at bedtime., Disp: 90 tablet, Rfl: 0   omeprazole  (PRILOSEC) 40 MG capsule, Take 1 capsule (40 mg total) by mouth daily., Disp: 90 capsule, Rfl: 1   ondansetron  (ZOFRAN -ODT) 4 MG disintegrating tablet, Take 1 tablet (4 mg total) by mouth every 8 (eight) hours as needed for nausea or vomiting., Disp: 20 tablet, Rfl: 0   SYRINGE-NEEDLE, DISP, 3 ML (B-D 3CC LUER-LOK SYR 25GX1) 25G X 1 3 ML MISC, USE AS DIRECTED WITH VIT B12 INJECTION, Disp: 12 each, Rfl: 0   Objective:     There were no vitals filed for this visit.    There is no height or weight on file to calculate BMI.    Physical Exam:     ***   Electronically signed by:  Odis Mace Terri Johnson [facility] 7:47 AM 05/17/24

## 2024-05-18 ENCOUNTER — Ambulatory Visit: Admitting: Sports Medicine

## 2024-05-18 ENCOUNTER — Other Ambulatory Visit (HOSPITAL_BASED_OUTPATIENT_CLINIC_OR_DEPARTMENT_OTHER): Payer: Self-pay

## 2024-05-18 ENCOUNTER — Other Ambulatory Visit (HOSPITAL_COMMUNITY): Payer: Self-pay

## 2024-05-18 ENCOUNTER — Other Ambulatory Visit: Payer: Self-pay

## 2024-05-18 VITALS — HR 119 | Ht 59.0 in

## 2024-05-18 DIAGNOSIS — G8929 Other chronic pain: Secondary | ICD-10-CM | POA: Diagnosis not present

## 2024-05-18 DIAGNOSIS — M7581 Other shoulder lesions, right shoulder: Secondary | ICD-10-CM

## 2024-05-18 DIAGNOSIS — M25511 Pain in right shoulder: Secondary | ICD-10-CM | POA: Diagnosis not present

## 2024-05-18 MED ORDER — MELOXICAM 15 MG PO TABS
15.0000 mg | ORAL_TABLET | Freq: Every day | ORAL | 0 refills | Status: DC | PRN
  Filled 2024-05-18: qty 30, 30d supply, fill #0

## 2024-06-01 ENCOUNTER — Telehealth: Admitting: Physician Assistant

## 2024-06-01 DIAGNOSIS — B9789 Other viral agents as the cause of diseases classified elsewhere: Secondary | ICD-10-CM | POA: Diagnosis not present

## 2024-06-01 DIAGNOSIS — J019 Acute sinusitis, unspecified: Secondary | ICD-10-CM | POA: Diagnosis not present

## 2024-06-01 MED ORDER — FLUTICASONE PROPIONATE 50 MCG/ACT NA SUSP: 2.0000 | Freq: Every day | NASAL | 0 refills | Status: AC

## 2024-06-01 NOTE — Progress Notes (Signed)
 E-Visit for Sinus Problems  We are sorry that you are not feeling well.  Here is how we plan to help!  Based on what you have shared with me it looks like you have sinusitis.  Sinusitis is inflammation and infection in the sinus cavities of the head.  Based on your presentation I believe you most likely have Acute Viral Sinusitis.This is an infection most likely caused by a virus. There is not specific treatment for viral sinusitis other than to help you with the symptoms until the infection runs its course.  You may use an oral decongestant such as Mucinex D or if you have glaucoma or high blood pressure use plain Mucinex. Saline nasal spray help and can safely be used as often as needed for congestion, I have prescribed: Fluticasone nasal spray two sprays in each nostril once a day  Some authorities believe that zinc sprays or the use of Echinacea may shorten the course of your symptoms.  Sinus infections are not as easily transmitted as other respiratory infection, however we still recommend that you avoid close contact with loved ones, especially the very young and elderly.  Remember to wash your hands thoroughly throughout the day as this is the number one way to prevent the spread of infection!  Home Care: Only take medications as instructed by your medical team. Do not take these medications with alcohol. A steam or ultrasonic humidifier can help congestion.  You can place a towel over your head and breathe in the steam from hot water coming from a faucet. Avoid close contacts especially the very young and the elderly. Cover your mouth when you cough or sneeze. Always remember to wash your hands.  Get Help Right Away If: You develop worsening fever or sinus pain. You develop a severe head ache or visual changes. Your symptoms persist after you have completed your treatment plan.  Make sure you Understand these instructions. Will watch your condition. Will get help right away if you  are not doing well or get worse.   Thank you for choosing an e-visit.  Your e-visit answers were reviewed by a board certified advanced clinical practitioner to complete your personal care plan. Depending upon the condition, your plan could have included both over the counter or prescription medications.  Please review your pharmacy choice. Make sure the pharmacy is open so you can pick up prescription now. If there is a problem, you may contact your provider through [employer] and have the prescription routed to another pharmacy.  Your safety is important to us . If you have drug allergies check your prescription carefully.   For the next 24 hours you can use MyChart to ask questions about today's visit, request a non-urgent call back, or ask for a work or school excuse. You will get an email in the next two days asking about your experience. I hope that your e-visit has been valuable and will speed your recovery.   I have spent 5 minutes in review of e-visit questionnaire, review and updating patient chart, medical decision making and response to patient.   Delon CHRISTELLA Dickinson, PA-C

## 2024-06-15 ENCOUNTER — Other Ambulatory Visit: Payer: Self-pay | Admitting: Family

## 2024-06-15 ENCOUNTER — Other Ambulatory Visit: Payer: Self-pay

## 2024-06-15 ENCOUNTER — Other Ambulatory Visit (HOSPITAL_COMMUNITY): Payer: Self-pay

## 2024-06-15 DIAGNOSIS — R1033 Periumbilical pain: Secondary | ICD-10-CM

## 2024-06-15 DIAGNOSIS — R112 Nausea with vomiting, unspecified: Secondary | ICD-10-CM

## 2024-06-15 MED ORDER — ONDANSETRON 4 MG PO TBDP
4.0000 mg | ORAL_TABLET | Freq: Three times a day (TID) | ORAL | 0 refills | Status: DC | PRN
Start: 2024-06-15 — End: 2024-07-04
  Filled 2024-06-15: qty 20, 7d supply, fill #0

## 2024-06-15 MED ORDER — MONTELUKAST SODIUM 10 MG PO TABS
10.0000 mg | ORAL_TABLET | Freq: Every day | ORAL | 0 refills | Status: DC
  Filled 2024-06-15: qty 90, 90d supply, fill #0

## 2024-06-15 MED ORDER — BUPROPION HCL ER (XL) 300 MG PO TB24
300.0000 mg | ORAL_TABLET | Freq: Every day | ORAL | 0 refills | Status: DC
  Filled 2024-06-15: qty 90, 90d supply, fill #0

## 2024-06-16 ENCOUNTER — Other Ambulatory Visit (HOSPITAL_COMMUNITY): Payer: Self-pay

## 2024-06-17 ENCOUNTER — Other Ambulatory Visit: Payer: Self-pay

## 2024-06-22 ENCOUNTER — Other Ambulatory Visit (HOSPITAL_COMMUNITY): Payer: Self-pay

## 2024-06-23 ENCOUNTER — Telehealth: Admitting: Family

## 2024-06-23 DIAGNOSIS — M545 Low back pain, unspecified: Secondary | ICD-10-CM

## 2024-06-23 MED ORDER — BACLOFEN 10 MG PO TABS
10.0000 mg | ORAL_TABLET | Freq: Three times a day (TID) | ORAL | 0 refills | Status: DC
Start: 2024-06-23 — End: 2024-08-21

## 2024-06-23 MED ORDER — NAPROXEN 500 MG PO TABS: 500.0000 mg | ORAL_TABLET | Freq: Two times a day (BID) | ORAL | 0 refills | Status: AC

## 2024-06-23 MED ORDER — PREDNISONE 10 MG (21) PO TBPK: ORAL_TABLET | ORAL | 0 refills | Status: DC

## 2024-06-23 NOTE — Progress Notes (Signed)
Approximately 5 minutes was spent documenting and reviewing patient's chart.

## 2024-06-23 NOTE — Progress Notes (Signed)
 E-Visit for Back Pain   We are sorry that you are not feeling well.  Here is how we plan to help!  Based on what you have shared with me it looks like you mostly have acute back pain.  Acute back pain is defined as musculoskeletal pain that can resolve in 1-3 weeks with conservative treatment.  I have prescribed Naprosyn  500 mg take one by mouth twice a day non-steroid anti-inflammatory (NSAID) as well as Baclofen  10 mg every eight hours as needed which is a muscle relaxer and prednisone  dose pack.   Some patients experience stomach irritation or in increased heartburn with anti-inflammatory drugs.  Please keep in mind that muscle relaxer's can cause fatigue and should not be taken while at work or driving.  Back pain is very common.  The pain often gets better over time.  The cause of back pain is usually not dangerous.  Most people can learn to manage their back pain on their own.  Home Care Stay active.  Start with short walks on flat ground if you can.  Try to walk farther each day. Do not sit, drive or stand in one place for more than 30 minutes.  Do not stay in bed. Do not avoid exercise or work.  Activity can help your back heal faster. Be careful when you bend or lift an object.  Bend at your knees, keep the object close to you, and do not twist. Sleep on a firm mattress.  Lie on your side, and bend your knees.  If you lie on your back, put a pillow under your knees. Only take medicines as told by your doctor. Put ice on the injured area. Put ice in a plastic bag Place a towel between your skin and the bag Leave the ice on for 15-20 minutes, 3-4 times a day for the first 2-3 days. 210 After that, you can switch between ice and heat packs. Ask your doctor about back exercises or massage. Avoid feeling anxious or stressed.  Find good ways to deal with stress, such as exercise.  Get Help Right Way If: Your pain does not go away with rest or medicine. Your pain does not go away in 1  week. You have new problems. You do not feel well. The pain spreads into your legs. You cannot control when you poop (bowel movement) or pee (urinate) You feel sick to your stomach (nauseous) or throw up (vomit) You have belly (abdominal) pain. You feel like you may pass out (faint). If you develop a fever.  Make Sure you: Understand these instructions. Will watch your condition Will get help right away if you are not doing well or get worse.  Your e-visit answers were reviewed by a board certified advanced clinical practitioner to complete your personal care plan.  Depending on the condition, your plan could have included both over the counter or prescription medications.  If there is a problem please reply  once you have received a response from your provider.  Your safety is important to us .  If you have drug allergies check your prescription carefully.    You can use MyChart to ask questions about today's visit, request a non-urgent call back, or ask for a work or school excuse for 24 hours related to this e-Visit. If it has been greater than 24 hours you will need to follow up with your provider, or enter a new e-Visit to address those concerns.  You will get an e-mail in the  next two days asking about your experience.  I hope that your e-visit has been valuable and will speed your recovery. Thank you for using e-visits.

## 2024-06-26 ENCOUNTER — Other Ambulatory Visit: Payer: Self-pay | Admitting: Family

## 2024-06-26 ENCOUNTER — Other Ambulatory Visit (HOSPITAL_COMMUNITY): Payer: Self-pay

## 2024-06-26 DIAGNOSIS — K219 Gastro-esophageal reflux disease without esophagitis: Secondary | ICD-10-CM

## 2024-06-27 ENCOUNTER — Other Ambulatory Visit (HOSPITAL_COMMUNITY): Payer: Self-pay

## 2024-06-27 ENCOUNTER — Other Ambulatory Visit: Payer: Self-pay

## 2024-06-27 MED ORDER — OMEPRAZOLE 40 MG PO CPDR
40.0000 mg | DELAYED_RELEASE_CAPSULE | Freq: Every day | ORAL | 0 refills | Status: DC
  Filled 2024-06-27: qty 90, 90d supply, fill #0

## 2024-06-27 NOTE — Telephone Encounter (Signed)
 Please call pt to schedule annual cpe.

## 2024-06-27 NOTE — Telephone Encounter (Signed)
 Lvm to sched physical

## 2024-07-03 ENCOUNTER — Ambulatory Visit: Payer: Self-pay | Admitting: *Deleted

## 2024-07-03 NOTE — Telephone Encounter (Signed)
 Appt scheduled

## 2024-07-03 NOTE — Telephone Encounter (Signed)
 Copied from CRM [id number]. Topic: Clinical - Red Word Triage >> Jul 03, 2024 11:21 AM Suzen RAMAN wrote: Red Word that prompted transfer to Nurse Triage: diarrhea, possible c.diff per patient, stomach pain & nausea Reason for Disposition  [1] SEVERE diarrhea (e.g., 7 or more times / day more than normal) AND [2] present > 24 hours (1 day)  Answer Assessment - Initial Assessment Questions 1. DIARRHEA SEVERITY: How bad is the diarrhea? How many more stools have you had in the past 24 hours than normal?      I'm having diarrhea for 3 days with stomach pain cramping and nausea.     2. ONSET: When did the diarrhea begin?      3 days ago 3. STOOL DESCRIPTION:  How loose or watery is the diarrhea? What is the stool color? Is there any blood or mucous in the stool?     It has some consistency.   When it hits I have to go right then. 4. VOMITING: Are you also vomiting? If Yes, ask: How many times in the past 24 hours?      No   Just nausea 5. ABDOMEN PAIN: Are you having any abdomen pain? If Yes, ask: What does it feel like? (e.g., crampy, dull, intermittent, constant)      Yes 6. ABDOMEN PAIN SEVERITY: If present, ask: How bad is the pain?  (e.g., Scale 1-10; mild, moderate, or severe)     4/10 7. ORAL INTAKE: If vomiting, Have you been able to drink liquids? How much liquids have you had in the past 24 hours?     I'm drinking plenty of water. L  I'm not eating much.   I get nauseas with food.   I'm eating and keeping it down.   8. HYDRATION: Any signs of dehydration? (e.g., dry mouth [not just dry lips], too weak to stand, dizziness, new weight loss) When did you last urinate?     I don't feel good.       9. EXPOSURE: Have you traveled to a foreign country recently? Have you been exposed to anyone with diarrhea? Could you have eaten any food that was spoiled?     No 10. ANTIBIOTIC USE: Are you taking antibiotics now or have you taken antibiotics in the past 2  months?       No antibiotics.    I've been on steroids.     I have a history of C Diff.   It was couple of months ago.   I was on antibiotics.   Vancomycin .   11. OTHER SYMPTOMS: Do you have any other symptoms? (e.g., fever, blood in stool)       No blood in stools 12. PREGNANCY: Is there any chance you are pregnant? When was your last menstrual period?       Not asked  Protocols used: Diarrhea-A-AH FYI Only or Action Required?: FYI only for provider.  Patient was last seen in primary care on 01/18/2024 by Almarie Waddell NOVAK, NP.  Called Nurse Triage reporting Diarrhea. History of C diff a couple of months ago from being on antibiotics.  Abd pain.     Symptoms began several days ago.4 days now  Interventions attempted: OTC medications: Imodium .  Symptoms are: gradually worsening.  Triage Disposition: See Physician Within 24 Hours  Patient/caregiver understands and will follow disposition?: Yes

## 2024-07-04 ENCOUNTER — Other Ambulatory Visit (HOSPITAL_BASED_OUTPATIENT_CLINIC_OR_DEPARTMENT_OTHER): Payer: Self-pay

## 2024-07-04 ENCOUNTER — Ambulatory Visit: Payer: Self-pay | Admitting: Family

## 2024-07-04 ENCOUNTER — Ambulatory Visit: Admitting: Family

## 2024-07-04 VITALS — BP 139/80 | HR 114 | Temp 98.4°F | Resp 16 | Ht 59.0 in | Wt 150.0 lb

## 2024-07-04 DIAGNOSIS — Z1231 Encounter for screening mammogram for malignant neoplasm of breast: Secondary | ICD-10-CM

## 2024-07-04 DIAGNOSIS — Z780 Asymptomatic menopausal state: Secondary | ICD-10-CM

## 2024-07-04 DIAGNOSIS — R7989 Other specified abnormal findings of blood chemistry: Secondary | ICD-10-CM | POA: Diagnosis not present

## 2024-07-04 DIAGNOSIS — R1033 Periumbilical pain: Secondary | ICD-10-CM | POA: Diagnosis not present

## 2024-07-04 DIAGNOSIS — F419 Anxiety disorder, unspecified: Secondary | ICD-10-CM

## 2024-07-04 DIAGNOSIS — R197 Diarrhea, unspecified: Secondary | ICD-10-CM | POA: Diagnosis not present

## 2024-07-04 DIAGNOSIS — R112 Nausea with vomiting, unspecified: Secondary | ICD-10-CM

## 2024-07-04 DIAGNOSIS — F32A Depression, unspecified: Secondary | ICD-10-CM

## 2024-07-04 LAB — HEPATIC FUNCTION PANEL
ALT: 31 U/L (ref 0–35)
AST: 23 U/L (ref 0–37)
Albumin: 4.5 g/dL (ref 3.5–5.2)
Alkaline Phosphatase: 78 U/L (ref 39–117)
Bilirubin, Direct: 0.1 mg/dL (ref 0.0–0.3)
Total Bilirubin: 0.4 mg/dL (ref 0.2–1.2)
Total Protein: 6.8 g/dL (ref 6.0–8.3)

## 2024-07-04 MED ORDER — ONDANSETRON 4 MG PO TBDP
4.0000 mg | ORAL_TABLET | Freq: Three times a day (TID) | ORAL | 0 refills | Status: DC | PRN
  Filled 2024-07-04: qty 30, 10d supply, fill #0

## 2024-07-04 MED ORDER — SERTRALINE HCL 50 MG PO TABS
50.0000 mg | ORAL_TABLET | Freq: Every day | ORAL | 1 refills | Status: DC
  Filled 2024-07-04: qty 30, 30d supply, fill #0

## 2024-07-04 NOTE — Progress Notes (Signed)
 Subjective:     Patient ID: Terri Johnson, female    DOB: 1966/07/22, 58 y.o.   MRN: [id number]  Chief Complaint  Patient presents with   Diarrhea    Patient complains of loose stools for 4 days   Depression    Patient reports increase in depression   Anxiety    Patient reports anxiety getting worse    HPI  Discussed the use of AI scribe software for clinical note transcription with the patient, who gave verbal consent to proceed.  History of Present Illness  Terri Johnson is a 58 year old female who presents with diarrhea and anxiety.  She experiences diarrhea and is unsure if it is related to recent prednisone  use, anxiety, or a past C. difficile infection. She feels significant anxiety and depression, overwhelmed by stress at work and home. At work, she manages new analyzers and training, increasing her stress. At home, she has conflicts with her husband. She takes bupropion  300 mg and Lexapro  20 mg for mood, with no improvement from Lexapro  despite being at the maximum dose. She is not in counseling. She uses Zofran  as needed and has a few doses left. She reports some abdominal tenderness. No safety concerns at home.     Health Maintenance Due  Topic Date Due   Pneumococcal Vaccine: 50+ Years (1 of 2 - PCV) Never done   Cervical Cancer Screening (HPV/Pap Cotest)  06/23/2018   Zoster Vaccines- Shingrix  (2 of 2) 07/20/2019   DEXA SCAN  08/19/2019   Influenza Vaccine  05/18/2024   COVID-19 Vaccine (3 - 2025-26 season) 06/18/2024   Mammogram  05/25/2024    Past Medical History:  Diagnosis Date   Allergy    Asthma    B12 deficiency 05/14/2015   Depression    GERD (gastroesophageal reflux disease)    History of chicken pox    Migraines    OSA (obstructive sleep apnea) 03/2016    Past Surgical History:  Procedure Laterality Date   CESAREAN SECTION  1991   CHOLECYSTECTOMY  2007   eardrum repair - left Left    INNER EAR SURGERY     Pt states she had her  eardrum rebuilt. Has had 8 sets of tubes in ears through [employer]   MYRINGOTOMY Bilateral    twice each.   TONSILLECTOMY AND ADENOIDECTOMY  1976   TUBAL LIGATION  2001    Family History  Problem Relation Age of Onset   Hyperlipidemia Mother    Heart disease Mother    Hypertension Mother    Depression Mother    Parkinson's disease Father    Depression Maternal Grandmother    Heart disease Maternal Grandfather    Hypertension Maternal Grandfather    Alcohol abuse Maternal Grandfather    AVM Maternal Aunt        congenital   Aneurysm Maternal Aunt    Depression Maternal Aunt    Alcohol abuse Maternal Uncle     Social History   Socioeconomic History   Marital status: Married    Spouse name: Not on file   Number of children: Not on file   Years of education: Not on file   Highest education level: Bachelor's degree (e.g., BA, AB, BS)  Occupational History   Not on file  Tobacco Use   Smoking status: Never   Smokeless tobacco: Never  Substance and Sexual Activity   Alcohol use: No    Alcohol/week: 0.0 standard drinks of alcohol   Drug use:  No   Sexual activity: Not on file  Other Topics Concern   Not on file  Social History Narrative   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    Has associates degree   Enjoys painting, animals, antiquing, shopping, reading, movies   Social Drivers of Health   Financial Resource Strain: Low Risk  (07/04/2024)   Overall Financial Resource Strain (CARDIA)    Difficulty of Paying Living Expenses: Not hard at all  Food Insecurity: No Food Insecurity (07/04/2024)   Hunger Vital Sign    Worried About Running Out of Food in the Last Year: Never true    Ran Out of Food in the Last Year: Never true  Transportation Needs: No Transportation Needs (07/04/2024)   PRAPARE - Administrator, Civil Service (Medical): No    Lack of Transportation (Non-Medical): No  Physical Activity:  Insufficiently Active (07/04/2024)   Exercise Vital Sign    Days of Exercise per Week: 3 days    Minutes of Exercise per Session: 30 min  Stress: Stress Concern Present (07/04/2024)   [employer] of Occupational Health - Occupational Stress Questionnaire    Feeling of Stress: Very much  Social Connections: Moderately Isolated (07/04/2024)   Social Connection and Isolation Panel    Frequency of Communication with Friends and Family: More than three times a week    Frequency of Social Gatherings with Friends and Family: Once a week    Attends Religious Services: Never    Database administrator or Organizations: No    Attends Engineer, structural: Not on file    Marital Status: Married  Catering manager Violence: Not on file    Outpatient Medications Prior to Visit  Medication Sig Dispense Refill   albuterol  (VENTOLIN  HFA) 108 (90 Base) MCG/ACT inhaler Inhale 2 puffs into the lungs every 6 (six) hours as needed for wheezing or shortness of breath. 6.7 g 1   baclofen  (LIORESAL ) 10 MG tablet Take 1 tablet (10 mg total) by mouth 3 (three) times daily. 30 each 0   betamethasone  valerate ointment (VALISONE ) 0.1 % Apply small amount to ear as needed for itching/scaling.     betamethasone  valerate ointment (VALISONE ) 0.1 % Apply to ears as needed for itching. 30 g 1   buPROPion  (WELLBUTRIN  XL) 300 MG 24 hr tablet Take 1 tablet (300 mg total) by mouth daily. 90 tablet 0   cetirizine (ZYRTEC) 10 MG tablet Take 10 mg by mouth daily.     Cholecalciferol (VITAMIN D3) 125 MCG (5000 UT) CHEW Chew by mouth.     cyanocobalamin  (DODEX ) 1000 MCG/ML injection Inject 1 mL every 30 days. 12 mL 4   cycloSPORINE  (RESTASIS ) 0.05 % ophthalmic emulsion Instill 1 drop into both eyes twice a day 180 each 3   fluticasone  (FLONASE ) 50 MCG/ACT nasal spray Place 2 sprays into both nostrils daily. 16 g 0   hydrOXYzine  (ATARAX ) 10 MG tablet Take 1 tablet (10 mg total) by mouth 3 (three) times daily as needed  for itching. 21 tablet 0   meloxicam  (MOBIC ) 15 MG tablet Take 1 tablet (15 mg total) by mouth daily. 30 tablet 0   meloxicam  (MOBIC ) 15 MG tablet Take 1 tablet (15 mg total) by mouth daily as needed for pain. 30 tablet 0   montelukast  (SINGULAIR ) 10 MG tablet Take 1 tablet (10 mg total) by mouth at bedtime. 90 tablet 0   naproxen  (NAPROSYN )  500 MG tablet Take 1 tablet (500 mg total) by mouth 2 (two) times daily with a meal. 30 tablet 0   omeprazole  (PRILOSEC) 40 MG capsule Take 1 capsule (40 mg total) by mouth daily. 90 capsule 0   SYRINGE-NEEDLE, DISP, 3 ML (B-D 3CC LUER-LOK SYR 25GX1) 25G X 1 3 ML MISC USE AS DIRECTED WITH VIT B12 INJECTION 12 each 0   escitalopram  (LEXAPRO ) 20 MG tablet Take 1 tablet (20 mg total) by mouth daily. 90 tablet 0   ondansetron  (ZOFRAN -ODT) 4 MG disintegrating tablet Take 1 tablet (4 mg total) by mouth every 8 (eight) hours as needed for nausea or vomiting. 20 tablet 0   predniSONE  (STERAPRED UNI-PAK 21 TAB) 10 MG (21) TBPK tablet Use as directed 21 tablet 0   No facility-administered medications prior to visit.    Allergies  Allergen Reactions   Aspirin Other (See Comments)    Gi/ringing ears   Tetracycline Rash    Rash/throat swells    ROS See HPI    Objective:    Physical Exam Constitutional:      General: She is not in acute distress.    Appearance: Normal appearance. She is well-developed.  HENT:     Head: Normocephalic and atraumatic.     Right Ear: External ear normal.     Left Ear: External ear normal.  Eyes:     General: No scleral icterus. Neck:     Thyroid : No thyromegaly.  Cardiovascular:     Rate and Rhythm: Normal rate and regular rhythm.     Heart sounds: Normal heart sounds. No murmur heard. Pulmonary:     Effort: Pulmonary effort is normal. No respiratory distress.     Breath sounds: Normal breath sounds. No wheezing.  Musculoskeletal:     Cervical back: Neck supple.  Skin:    General: Skin is warm and dry.   Neurological:     Mental Status: She is alert and oriented to person, place, and time.  Psychiatric:        Attention and Perception: Attention normal.        Mood and Affect: Mood is anxious. Affect is tearful.        Speech: Speech normal.        Behavior: Behavior normal.        Thought Content: Thought content normal.        Judgment: Judgment normal.      BP 139/80 (BP Location: Right Arm, Patient Position: Sitting, Cuff Size: Small)   Pulse (!) 114   Temp 98.4 F (36.9 C) (Oral)   Resp 16   Ht 4' 11 (1.499 m)   Wt 150 lb (68 kg)   LMP 03/23/2014   BMI 30.30 kg/m  Wt Readings from Last 3 Encounters:  07/04/24 150 lb (68 kg)  01/18/24 174 lb (78.9 kg)  09/29/23 184 lb (83.5 kg)       Assessment & Plan:   Problem List Items Addressed This Visit       Unprioritized   Diarrhea - Primary   Recent treatment for C diff. Will obtain stool studies to rule recurrence.      Relevant Orders   GI Profile, Stool, PCR   Anxiety and depression    Uncontrolled on current treatment with bupropion  300 mg and escitalopram  20 mg. Will plan to transition from escitalopram  to sertraline .  Referral to psychiatry and counseling recommended. Pt given contact information for both.  - Taper down escitalopram  toand initiate sertraline   with overlap: half tablet daily of escitalopram  and 1/2 tablet daily sertraline  for one week, then full tablet of sertraline  and stop escitalopram . - Maintain current bupropion  dosage.      Relevant Medications   sertraline  (ZOLOFT ) 50 MG tablet   Other Visit Diagnoses       Breast cancer screening by mammogram       Relevant Orders   MM 3D SCREENING MAMMOGRAM BILATERAL BREAST     Postmenopausal estrogen deficiency       Relevant Orders   DG Bone Density     Abnormal LFTs       Relevant Orders   Hepatic function panel     Nausea vomiting and diarrhea       Relevant Medications   ondansetron  (ZOFRAN -ODT) 4 MG disintegrating tablet      Periumbilical abdominal pain       Relevant Medications   ondansetron  (ZOFRAN -ODT) 4 MG disintegrating tablet       I have discontinued Terri Johnson's escitalopram  and predniSONE . I am also having her start on sertraline . Additionally, I am having her maintain her betamethasone  valerate ointment, Vitamin D3, cetirizine, B-D 3CC LUER-LOK SYR 25GX1, cycloSPORINE , hydrOXYzine , albuterol , meloxicam , betamethasone  valerate ointment, cyanocobalamin , meloxicam , fluticasone , montelukast , buPROPion , naproxen , baclofen , omeprazole , and ondansetron .  Meds ordered this encounter  Medications   sertraline  (ZOLOFT ) 50 MG tablet    Sig: Take 1 tablet (50 mg total) by mouth daily.    Dispense:  30 tablet    Refill:  1    Supervising Provider:   DOMENICA BLACKBIRD A [[id number]]   ondansetron  (ZOFRAN -ODT) 4 MG disintegrating tablet    Sig: Take 1 tablet (4 mg total) by mouth every 8 (eight) hours as needed for nausea or vomiting.    Dispense:  30 tablet    Refill:  0    Supervising Provider:   DOMENICA BLACKBIRD A [[id number]]

## 2024-07-04 NOTE — Patient Instructions (Addendum)
 Psychiatric Services:  Dr. Emilio Aurora [facility]) - [phone] [facility] [place]) [phone] [facility] [place]) [phone] Psychiatric and Counseling [facility]) - [phone] [facility], [address], [place], [place] [phone]  VISIT SUMMARY:  Today, you were seen for diarrhea and anxiety. We discussed your symptoms, possible causes, and made plans for further testing and treatment adjustments.  YOUR PLAN:  DIARRHEA: You have been experiencing diarrhea, which may be related to recent prednisone  use, stress, or a recurrence of a past C. difficile infection. -We will order stool studies to check for a recurrence of C. difficile infection. -You will receive a stool specimen collection kit.  ABDOMINAL TENDERNESS: You reported mild abdominal tenderness with no specific cause identified. -We will order liver function tests to investigate further.  MAJOR DEPRESSIVE DISORDER AND ANXIETY DISORDER: You are currently taking bupropion  and escitalopram  for mood, but escitalopram  has not been effective. We will transition you to sertraline  and recommend counseling. -Taper down escitalopram  and start sertraline  with overlap: take half a tablet of escitalopram  and sertraline  for one week, then switch to a full tablet of sertraline  and stop escitalopram . -We will provide information on local psychiatrists and online psychiatry providers. -You will receive a pamphlet for counseling services. -Continue your current dosage of bupropion .  GENERAL HEALTH MAINTENANCE: Routine health maintenance is important. Your Pap smear is overdue, and we recommend a mammogram and bone density screening. -We will order a mammogram. -We will order a bone density screening.

## 2024-07-04 NOTE — Assessment & Plan Note (Signed)
 Recent treatment for C diff. Will obtain stool studies to rule recurrence.

## 2024-07-04 NOTE — Assessment & Plan Note (Signed)
  Uncontrolled on current treatment with bupropion  300 mg and escitalopram  20 mg. Will plan to transition from escitalopram  to sertraline .  Referral to psychiatry and counseling recommended. Pt given contact information for both.  - Taper down escitalopram  toand initiate sertraline  with overlap: half tablet daily of escitalopram  and 1/2 tablet daily sertraline  for one week, then full tablet of sertraline  and stop escitalopram . - Maintain current bupropion  dosage.

## 2024-08-09 ENCOUNTER — Ambulatory Visit (HOSPITAL_BASED_OUTPATIENT_CLINIC_OR_DEPARTMENT_OTHER)
Admission: RE | Admit: 2024-08-09 | Discharge: 2024-08-09 | Disposition: A | Discharge status: Home / Self Care | Source: Ambulatory Visit | Attending: Family | Admitting: Family

## 2024-08-09 ENCOUNTER — Encounter (HOSPITAL_BASED_OUTPATIENT_CLINIC_OR_DEPARTMENT_OTHER): Payer: Self-pay

## 2024-08-09 ENCOUNTER — Ambulatory Visit (HOSPITAL_BASED_OUTPATIENT_CLINIC_OR_DEPARTMENT_OTHER)
Admission: RE | Admit: 2024-08-09 | Discharge: 2024-08-09 | Disposition: A | Source: Ambulatory Visit | Attending: Family | Admitting: Family

## 2024-08-09 DIAGNOSIS — Z780 Asymptomatic menopausal state: Secondary | ICD-10-CM | POA: Insufficient documentation

## 2024-08-09 DIAGNOSIS — M8589 Other specified disorders of bone density and structure, multiple sites: Secondary | ICD-10-CM | POA: Diagnosis not present

## 2024-08-09 DIAGNOSIS — Z1231 Encounter for screening mammogram for malignant neoplasm of breast: Secondary | ICD-10-CM | POA: Insufficient documentation

## 2024-08-10 ENCOUNTER — Encounter: Payer: Self-pay | Admitting: Family

## 2024-08-10 DIAGNOSIS — M8580 Other specified disorders of bone density and structure, unspecified site: Secondary | ICD-10-CM | POA: Insufficient documentation

## 2024-08-14 ENCOUNTER — Encounter: Admitting: Family

## 2024-08-21 ENCOUNTER — Encounter: Payer: Self-pay | Admitting: Family

## 2024-08-21 ENCOUNTER — Other Ambulatory Visit: Payer: Self-pay

## 2024-08-21 ENCOUNTER — Other Ambulatory Visit (HOSPITAL_COMMUNITY)
Admission: RE | Admit: 2024-08-21 | Discharge: 2024-08-21 | Disposition: A | Discharge status: Home / Self Care | Source: Ambulatory Visit | Attending: Family | Admitting: Family

## 2024-08-21 ENCOUNTER — Ambulatory Visit (INDEPENDENT_AMBULATORY_CARE_PROVIDER_SITE_OTHER): Admitting: Family

## 2024-08-21 VITALS — BP 127/78 | HR 109 | Temp 99.5°F | Resp 16 | Ht 59.0 in | Wt 157.0 lb

## 2024-08-21 DIAGNOSIS — Z0000 Encounter for general adult medical examination without abnormal findings: Secondary | ICD-10-CM

## 2024-08-21 DIAGNOSIS — E785 Hyperlipidemia, unspecified: Secondary | ICD-10-CM

## 2024-08-21 DIAGNOSIS — E538 Deficiency of other specified B group vitamins: Secondary | ICD-10-CM | POA: Diagnosis not present

## 2024-08-21 DIAGNOSIS — F419 Anxiety disorder, unspecified: Secondary | ICD-10-CM

## 2024-08-21 DIAGNOSIS — K219 Gastro-esophageal reflux disease without esophagitis: Secondary | ICD-10-CM

## 2024-08-21 DIAGNOSIS — F32A Depression, unspecified: Secondary | ICD-10-CM

## 2024-08-21 DIAGNOSIS — Z01419 Encounter for gynecological examination (general) (routine) without abnormal findings: Secondary | ICD-10-CM

## 2024-08-21 DIAGNOSIS — D519 Vitamin B12 deficiency anemia, unspecified: Secondary | ICD-10-CM

## 2024-08-21 DIAGNOSIS — J45909 Unspecified asthma, uncomplicated: Secondary | ICD-10-CM | POA: Diagnosis not present

## 2024-08-21 MED ORDER — SERTRALINE HCL 50 MG PO TABS
50.0000 mg | ORAL_TABLET | Freq: Every day | ORAL | 1 refills | Status: AC
  Filled 2024-08-21: qty 90, 90d supply, fill #0

## 2024-08-21 MED ORDER — ALBUTEROL SULFATE HFA 108 (90 BASE) MCG/ACT IN AERS
2.0000 | INHALATION_SPRAY | Freq: Four times a day (QID) | RESPIRATORY_TRACT | 1 refills | Status: AC | PRN
  Filled 2024-08-21: qty 6.7, 25d supply, fill #0

## 2024-08-21 MED ORDER — OMEPRAZOLE 40 MG PO CPDR
40.0000 mg | DELAYED_RELEASE_CAPSULE | Freq: Every day | ORAL | 1 refills | Status: AC
  Filled 2024-08-21: qty 90, 90d supply, fill #0

## 2024-08-21 MED ORDER — CYANOCOBALAMIN 1000 MCG/ML IJ SOLN
1000.0000 ug | INTRAMUSCULAR | 4 refills | Status: AC
  Filled 2024-08-21: qty 12, 360d supply, fill #0
  Filled 2024-09-21: qty 3, 90d supply, fill #0

## 2024-08-21 MED ORDER — BUPROPION HCL ER (XL) 300 MG PO TB24
300.0000 mg | ORAL_TABLET | Freq: Every day | ORAL | 1 refills | Status: AC
  Filled 2024-08-21 – 2024-09-21 (×2): qty 90, 90d supply, fill #0

## 2024-08-21 NOTE — Progress Notes (Signed)
 Subjective:     Patient ID: Terri Johnson, female    DOB: 1966/01/09, 58 y.o.   MRN: [id number]  Chief Complaint  Patient presents with   Annual Exam    HPI  Discussed the use of AI scribe software for clinical note transcription with the patient, who gave verbal consent to proceed.  History of Present Illness  Terri Johnson is a 58 year old female with depression who presents for medication management and follow-up.  She has experienced improvement in her depression symptoms after switching from escitalopram  to sertraline  and continues to take bupropion  without side effects. Her recent diarrhea has resolved, and she attributes it to anxiety. She experiences pain in the gallbladder area when consuming fatty foods, despite having had her gallbladder removed, and associates this pain with her fatty liver. She has been monitoring her diet closely, avoiding fatty foods, and has lost weight as a result. She is walking three times a week and aims to lose more weight. She uses albuterol  as needed, especially with the change in weather. She administers B12 injections at home and is becoming more proficient. No current cough, cold symptoms, skin rashes, swelling in the legs, constipation, urinary concerns, or frequent headaches. She reports improvement in anxiety and headaches, attributing some relief to weight loss. She mentions work-related stress due to new instruments and training responsibilities, which contribute to her anxiety.  Immunizations: declines prevnar today Diet: improved Exercise: walks 3 days a week Colonoscopy: cologuard 7/23  Pap Smear: due, will complete today Mammogram: 10/23 Vision: up to date Dental: up to date      Health Maintenance Due  Topic Date Due   Pneumococcal Vaccine: 50+ Years (1 of 2 - PCV) Never done   Cervical Cancer Screening (HPV/Pap Cotest)  06/23/2018   COVID-19 Vaccine (3 - 2025-26 season) 06/18/2024    Past Medical History:  Diagnosis  Date   Allergy    Asthma    B12 deficiency 05/14/2015   Depression    GERD (gastroesophageal reflux disease)    History of chicken pox    Migraines    OSA (obstructive sleep apnea) 03/18/2016   Osteopenia     Past Surgical History:  Procedure Laterality Date   CESAREAN SECTION  1991   CHOLECYSTECTOMY  2007   eardrum repair - left Left    INNER EAR SURGERY     Pt states she had her eardrum rebuilt. Has had 8 sets of tubes in ears through [employer]   MYRINGOTOMY Bilateral    twice each.   TONSILLECTOMY AND ADENOIDECTOMY  1976   TUBAL LIGATION  2001    Family History  Problem Relation Age of Onset   Hyperlipidemia Mother    Heart disease Mother    Hypertension Mother    Depression Mother    Parkinson's disease Father    Depression Maternal Grandmother    Heart disease Maternal Grandfather    Hypertension Maternal Grandfather    Alcohol abuse Maternal Grandfather    AVM Maternal Aunt        congenital   Aneurysm Maternal Aunt    Depression Maternal Aunt    Alcohol abuse Maternal Uncle     Social History   Socioeconomic History   Marital status: Married    Spouse name: Not on file   Number of children: Not on file   Years of education: Not on file   Highest education level: Bachelor's degree (e.g., BA, AB, BS)  Occupational History   Not  on file  Tobacco Use   Smoking status: Never   Smokeless tobacco: Never  Substance and Sexual Activity   Alcohol use: No    Alcohol/week: 0.0 standard drinks of alcohol   Drug use: No   Sexual activity: Not on file  Other Topics Concern   Not on file  Social History Narrative   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    Has associates degree   Enjoys painting, animals, antiquing, shopping, reading, movies   Social Drivers of Health   Financial Resource Strain: Low Risk  (08/20/2024)   Overall Financial Resource Strain (CARDIA)    Difficulty of Paying Living Expenses: Not hard  at all  Food Insecurity: No Food Insecurity (08/20/2024)   Hunger Vital Sign    Worried About Running Out of Food in the Last Year: Never true    Ran Out of Food in the Last Year: Never true  Transportation Needs: No Transportation Needs (08/20/2024)   PRAPARE - Administrator, Civil Service (Medical): No    Lack of Transportation (Non-Medical): No  Physical Activity: Insufficiently Active (08/20/2024)   Exercise Vital Sign    Days of Exercise per Week: 3 days    Minutes of Exercise per Session: 30 min  Stress: Stress Concern Present (08/20/2024)   [employer] of Occupational Health - Occupational Stress Questionnaire    Feeling of Stress: Rather much  Social Connections: Moderately Isolated (08/20/2024)   Social Connection and Isolation Panel    Frequency of Communication with Friends and Family: More than three times a week    Frequency of Social Gatherings with Friends and Family: Once a week    Attends Religious Services: Patient declined    Database Administrator or Organizations: No    Attends Engineer, Structural: Not on file    Marital Status: Married  Catering Manager Violence: Not on file    Outpatient Medications Prior to Visit  Medication Sig Dispense Refill   betamethasone  valerate ointment (VALISONE ) 0.1 % Apply to ears as needed for itching. 30 g 1   cetirizine (ZYRTEC) 10 MG tablet Take 10 mg by mouth daily.     Cholecalciferol (VITAMIN D3) 125 MCG (5000 UT) CHEW Chew by mouth.     cycloSPORINE  (RESTASIS ) 0.05 % ophthalmic emulsion Instill 1 drop into both eyes twice a day 180 each 3   fluticasone  (FLONASE ) 50 MCG/ACT nasal spray Place 2 sprays into both nostrils daily. 16 g 0   hydrOXYzine  (ATARAX ) 10 MG tablet Take 1 tablet (10 mg total) by mouth 3 (three) times daily as needed for itching. 21 tablet 0   meloxicam  (MOBIC ) 15 MG tablet Take 1 tablet (15 mg total) by mouth daily as needed for pain. 30 tablet 0   montelukast  (SINGULAIR ) 10 MG  tablet Take 1 tablet (10 mg total) by mouth at bedtime. 90 tablet 0   naproxen  (NAPROSYN ) 500 MG tablet Take 1 tablet (500 mg total) by mouth 2 (two) times daily with a meal. 30 tablet 0   ondansetron  (ZOFRAN -ODT) 4 MG disintegrating tablet Take 1 tablet (4 mg total) by mouth every 8 (eight) hours as needed for nausea or vomiting. 30 tablet 0   SYRINGE-NEEDLE, DISP, 3 ML (B-D 3CC LUER-LOK SYR 25GX1) 25G X 1 3 ML MISC USE AS DIRECTED WITH VIT B12 INJECTION 12 each 0   albuterol  (VENTOLIN  HFA) 108 (90 Base) MCG/ACT inhaler Inhale 2 puffs  into the lungs every 6 (six) hours as needed for wheezing or shortness of breath. 6.7 g 1   buPROPion  (WELLBUTRIN  XL) 300 MG 24 hr tablet Take 1 tablet (300 mg total) by mouth daily. 90 tablet 0   cyanocobalamin  (DODEX ) 1000 MCG/ML injection Inject 1 mL every 30 days. 12 mL 4   omeprazole  (PRILOSEC) 40 MG capsule Take 1 capsule (40 mg total) by mouth daily. 90 capsule 0   sertraline  (ZOLOFT ) 50 MG tablet Take 1 tablet (50 mg total) by mouth daily. 30 tablet 1   baclofen  (LIORESAL ) 10 MG tablet Take 1 tablet (10 mg total) by mouth 3 (three) times daily. 30 each 0   betamethasone  valerate ointment (VALISONE ) 0.1 % Apply small amount to ear as needed for itching/scaling.     meloxicam  (MOBIC ) 15 MG tablet Take 1 tablet (15 mg total) by mouth daily. 30 tablet 0   No facility-administered medications prior to visit.    Allergies  Allergen Reactions   Aspirin Other (See Comments)    Gi/ringing ears   Tetracycline Rash    Rash/throat swells    Review of Systems  Constitutional:  Positive for weight loss.  HENT:  Negative for congestion and hearing loss.   Eyes:  Positive for blurred vision.  Respiratory:  Negative for cough.   Cardiovascular:  Negative for leg swelling.  Gastrointestinal:  Negative for constipation and diarrhea.  Genitourinary:  Negative for dysuria and frequency.  Musculoskeletal:  Negative for joint pain and myalgias.  Skin:  Negative for  rash.  Neurological:  Negative for headaches.  Psychiatric/Behavioral:  Negative for depression. The patient is nervous/anxious (mild).        Objective:    Physical Exam   BP 127/78 (BP Location: Right Arm, Patient Position: Sitting, Cuff Size: Normal)   Pulse (!) 109   Temp 99.5 F (37.5 C) (Oral)   Resp 16   Ht 4' 11 (1.499 m)   Wt 157 lb (71.2 kg)   LMP 03/23/2014   SpO2 100%   BMI 31.71 kg/m  Wt Readings from Last 3 Encounters:  08/21/24 157 lb (71.2 kg)  07/04/24 150 lb (68 kg)  01/18/24 174 lb (78.9 kg)   Physical Exam  Constitutional: She is oriented to person, place, and time. She appears well-developed and well-nourished. No distress.  HENT:  Head: Normocephalic and atraumatic.  Right Ear: Tympanic membrane and ear canal normal.  Left Ear: Tympanic membrane and ear canal normal.  Mouth/Throat: Oropharynx is clear and moist.  Eyes: Pupils are equal, round, and reactive to light. No scleral icterus.  Neck: Normal range of motion. No thyromegaly present.  Cardiovascular: Normal rate and regular rhythm.   No murmur heard. Pulmonary/Chest: Effort normal and breath sounds normal. No respiratory distress. He has no wheezes. She has no rales. She exhibits no tenderness.  Abdominal: Soft. Bowel sounds are normal. She exhibits no distension and no mass. There is no tenderness. There is no rebound and no guarding.  Musculoskeletal: She exhibits no edema.  Lymphadenopathy:    She has no cervical adenopathy.  Neurological: She is alert and oriented to person, place, and time. She exhibits normal muscle tone. Coordination normal.  Skin: Skin is warm and dry.  Psychiatric: She has a normal mood and affect. Her behavior is normal. Judgment and thought content normal.  Breasts: Examined lying Right: Without masses, retractions, discharge or axillary adenopathy.  Left: Without masses, retractions, discharge or axillary adenopathy.  Inguinal/mons: Normal without inguinal  adenopathy  External genitalia: Normal  BUS/Urethra/Skene's glands: Normal  Bladder: Normal  Vagina: Normal  Cervix: Normal  Uterus: normal in size, shape and contour. Midline and mobile  Adnexa/parametria:  Rt: Without masses or tenderness.  Lt: Without masses or tenderness.  Anus and perineum: Normal            Assessment & Plan:       Lab Results  Component Value Date   CHOL 251 (H) 09/25/2021   HDL 52 09/25/2021   LDLCALC 166 (H) 09/25/2021   LDLDIRECT 142.0 05/25/2019   TRIG 180 (H) 09/25/2021   CHOLHDL 4.8 09/25/2021    Assessment & Plan:   Problem List Items Addressed This Visit       Unprioritized   Vitamin B12 deficiency anemia   Relevant Medications   cyanocobalamin  (DODEX ) 1000 MCG/ML injection   Preventative health care - Primary    Routine wellness visit with discussions on immunization, diet, exercise, and screenings. - Administer pneumonia vaccine if desired. - Schedule Cologuard test for July 2026. - Perform Pap smear today. - Ensure mammogram is up to date. - Check B12, cholesterol, and kidney function labs.      GERD (gastroesophageal reflux disease)   Stable on prilosec as long as she eats a proper diet.      Relevant Medications   omeprazole  (PRILOSEC) 40 MG capsule   B12 deficiency   Continues home injections, update b12 level.       Relevant Medications   cyanocobalamin  (DODEX ) 1000 MCG/ML injection   Other Relevant Orders   B12   Asthma   Currently stable, has albuterol  on hand for prn use.       Relevant Medications   albuterol  (VENTOLIN  HFA) 108 (90 Base) MCG/ACT inhaler   Anxiety and depression   Mood is improved with transition from lexapro  to sertraline . Continue sertraline  as well as wellbutrin  xl.       Relevant Medications   sertraline  (ZOLOFT ) 50 MG tablet   buPROPion  (WELLBUTRIN  XL) 300 MG 24 hr tablet   Other Visit Diagnoses       Encounter for routine gynecological examination with Papanicolaou smear  of cervix       Relevant Orders   Cytology - PAP( [facility])     Hyperlipidemia, unspecified hyperlipidemia type       Relevant Orders   Lipid panel   Comp Met (CMET)       I have discontinued Darianna D. Reifsteck's baclofen . I am also having her maintain her Vitamin D3, cetirizine, B-D 3CC LUER-LOK SYR 25GX1, cycloSPORINE , hydrOXYzine , betamethasone  valerate ointment, meloxicam , fluticasone , montelukast , naproxen , ondansetron , sertraline , omeprazole , albuterol , buPROPion , and cyanocobalamin .  Meds ordered this encounter  Medications   sertraline  (ZOLOFT ) 50 MG tablet    Sig: Take 1 tablet (50 mg total) by mouth daily.    Dispense:  90 tablet    Refill:  1    Supervising Provider:   DOMENICA BLACKBIRD A [[id number]]   omeprazole  (PRILOSEC) 40 MG capsule    Sig: Take 1 capsule (40 mg total) by mouth daily.    Dispense:  90 capsule    Refill:  1    Pt requests mail order for all meds please    Supervising Provider:   DOMENICA BLACKBIRD A [[id number]]   albuterol  (VENTOLIN  HFA) 108 (90 Base) MCG/ACT inhaler    Sig: Inhale 2 puffs into the lungs every 6 (six) hours as needed for wheezing or shortness of breath.    Dispense:  6.7 g  Refill:  1    Supervising Provider:   DOMENICA BLACKBIRD A [[id number]]   buPROPion  (WELLBUTRIN  XL) 300 MG 24 hr tablet    Sig: Take 1 tablet (300 mg total) by mouth daily.    Dispense:  90 tablet    Refill:  1    Requested drug refills are authorized, however, the patient needs further evaluation and/or laboratory testing before further refills are given. Ask her to make an appointment for this.    Supervising Provider:   DOMENICA BLACKBIRD A [[id number]]   cyanocobalamin  (DODEX ) 1000 MCG/ML injection    Sig: Inject 1 mL every 30 days.    Dispense:  12 mL    Refill:  4    Supervising Provider:   DOMENICA BLACKBIRD A [[id number]]

## 2024-08-21 NOTE — Patient Instructions (Signed)
 VISIT SUMMARY:  Today, you had a follow-up visit to manage your depression and anxiety, and to discuss your overall wellness. You reported improvement in your symptoms and shared your progress with weight loss and exercise. We also reviewed your asthma, GERD, and vitamin B12 deficiency management.  YOUR PLAN:  ADULT WELLNESS VISIT: Routine wellness visit with discussions on immunization, diet, exercise, and screenings. -Administer pneumonia vaccine if desired. -Schedule Cologuard test for July 2026. -Perform Pap smear today. -Ensure mammogram is up to date. -Check B12, cholesterol, and kidney function labs.  DEPRESSION AND ANXIETY DISORDER: Your depression and anxiety are being managed with Zoloft  and Wellbutrin , and you have reported improvement in your symptoms. -Continue taking Zoloft  and Wellbutrin . -A 90-day supply of Zoloft  will be sent to your pharmacy. -Monitor your symptoms and we will adjust your medication as needed. -Schedule a follow-up in 6 months unless your symptoms change.  ASTHMA: Your asthma is being managed with an albuterol  inhaler as needed, and you have not reported any acute symptoms. -A prescription for one albuterol  inhaler will be provided.  GASTROESOPHAGEAL REFLUX DISEASE (GERD): Your GERD symptoms are being managed with dietary modifications. Pain and nausea with fatty foods may be related to your previous gallbladder removal and fatty liver. -Continue with your dietary modifications to manage symptoms.  HYPERLIPIDEMIA: Your hyperlipidemia is noted, and your weight loss may positively impact your cholesterol levels. -We will check your cholesterol levels.  VITAMIN B12 DEFICIENCY: Your vitamin B12 deficiency is being managed with home injections, and you have reported improvement in your administration technique. -Continue with your B12 injections at home. -We will check your B12 levels.

## 2024-08-21 NOTE — Assessment & Plan Note (Signed)
 Currently stable, has albuterol on hand for prn use.

## 2024-08-21 NOTE — Assessment & Plan Note (Signed)
 Continues home injections, update b12 level.

## 2024-08-21 NOTE — Assessment & Plan Note (Signed)
 Mood is improved with transition from lexapro  to sertraline . Continue sertraline  as well as wellbutrin  xl.

## 2024-08-21 NOTE — Assessment & Plan Note (Signed)
 Stable on prilosec as long as she eats a proper diet.

## 2024-08-21 NOTE — Assessment & Plan Note (Signed)
  Routine wellness visit with discussions on immunization, diet, exercise, and screenings. - Administer pneumonia vaccine if desired. - Schedule Cologuard test for July 2026. - Perform Pap smear today. - Ensure mammogram is up to date. - Check B12, cholesterol, and kidney function labs.

## 2024-08-22 ENCOUNTER — Other Ambulatory Visit (HOSPITAL_COMMUNITY): Payer: Self-pay

## 2024-08-22 LAB — COMPREHENSIVE METABOLIC PANEL WITH GFR
ALT: 26 U/L (ref 0–35)
AST: 20 U/L (ref 0–37)
Albumin: 4.3 g/dL (ref 3.5–5.2)
Alkaline Phosphatase: 73 U/L (ref 39–117)
BUN: 14 mg/dL (ref 6–23)
CO2: 27 meq/L (ref 19–32)
Calcium: 9.2 mg/dL (ref 8.4–10.5)
Chloride: 105 meq/L (ref 96–112)
Creatinine, Ser: 0.83 mg/dL (ref 0.40–1.20)
GFR: 77.88 mL/min (ref >60.00)
Glucose, Bld: 63 mg/dL — ABNORMAL LOW (ref 70–99)
Potassium: 3.9 meq/L (ref 3.5–5.1)
Sodium: 141 meq/L (ref 135–145)
Total Bilirubin: 0.3 mg/dL (ref 0.2–1.2)
Total Protein: 6.6 g/dL (ref 6.0–8.3)

## 2024-08-22 LAB — LIPID PANEL
Cholesterol: 210 mg/dL — ABNORMAL HIGH (ref 0–200)
HDL: 56.5 mg/dL (ref >39.00)
LDL Cholesterol: 130 mg/dL — ABNORMAL HIGH (ref 0–99)
NonHDL: 153
Total CHOL/HDL Ratio: 4
Triglycerides: 116 mg/dL (ref 0.0–149.0)
VLDL: 23.2 mg/dL (ref 0.0–40.0)

## 2024-08-23 ENCOUNTER — Ambulatory Visit: Payer: Self-pay | Admitting: Family

## 2024-08-23 LAB — VITAMIN B12: Vitamin B-12: 608 pg/mL (ref 211–911)

## 2024-08-24 LAB — CYTOLOGY - PAP
Adequacy: ABSENT
Comment: NEGATIVE
Diagnosis: NEGATIVE
High risk HPV: NEGATIVE

## 2024-09-21 ENCOUNTER — Other Ambulatory Visit: Payer: Self-pay

## 2024-09-21 ENCOUNTER — Other Ambulatory Visit (HOSPITAL_COMMUNITY): Payer: Self-pay

## 2024-09-21 ENCOUNTER — Other Ambulatory Visit: Payer: Self-pay | Admitting: Sports Medicine

## 2024-09-21 ENCOUNTER — Other Ambulatory Visit: Payer: Self-pay | Admitting: Family

## 2024-09-21 DIAGNOSIS — R1033 Periumbilical pain: Secondary | ICD-10-CM

## 2024-09-21 DIAGNOSIS — R112 Nausea with vomiting, unspecified: Secondary | ICD-10-CM

## 2024-09-21 MED ORDER — MELOXICAM 15 MG PO TABS
15.0000 mg | ORAL_TABLET | Freq: Every day | ORAL | 0 refills | Status: AC | PRN
  Filled 2024-09-21: qty 30, 30d supply, fill #0

## 2024-09-21 MED ORDER — MONTELUKAST SODIUM 10 MG PO TABS
10.0000 mg | ORAL_TABLET | Freq: Every day | ORAL | 1 refills | Status: AC
  Filled 2024-09-21: qty 90, 90d supply, fill #0

## 2024-09-21 MED ORDER — ONDANSETRON 4 MG PO TBDP
4.0000 mg | ORAL_TABLET | Freq: Three times a day (TID) | ORAL | 0 refills | Status: AC | PRN
  Filled 2024-09-21: qty 30, 10d supply, fill #0

## 2024-09-24 ENCOUNTER — Telehealth: Admitting: Family Medicine

## 2024-09-24 DIAGNOSIS — M545 Low back pain, unspecified: Secondary | ICD-10-CM

## 2024-09-24 MED ORDER — BACLOFEN 10 MG PO TABS: 10.0000 mg | ORAL_TABLET | Freq: Three times a day (TID) | ORAL | 0 refills | Status: AC

## 2024-09-24 MED ORDER — PREDNISONE 20 MG PO TABS: 20.0000 mg | ORAL_TABLET | Freq: Two times a day (BID) | ORAL | 0 refills | Status: AC

## 2024-09-24 NOTE — Progress Notes (Signed)
